# Patient Record
Sex: Female | Born: 1960 | Hispanic: Yes | Marital: Married | Smoking: Never smoker
Health system: Southern US, Community
[De-identification: ages and names within clinical notes are randomized; demographics above are authoritative.]

## PROBLEM LIST (undated history)

## (undated) DIAGNOSIS — I10 Essential (primary) hypertension: Secondary | ICD-10-CM

## (undated) DIAGNOSIS — M1990 Unspecified osteoarthritis, unspecified site: Secondary | ICD-10-CM

## (undated) DIAGNOSIS — K2970 Gastritis, unspecified, without bleeding: Secondary | ICD-10-CM

## (undated) DIAGNOSIS — Z8719 Personal history of other diseases of the digestive system: Secondary | ICD-10-CM

## (undated) DIAGNOSIS — F419 Anxiety disorder, unspecified: Secondary | ICD-10-CM

## (undated) DIAGNOSIS — C8590 Non-Hodgkin lymphoma, unspecified, unspecified site: Secondary | ICD-10-CM

## (undated) DIAGNOSIS — K5790 Diverticulosis of intestine, part unspecified, without perforation or abscess without bleeding: Secondary | ICD-10-CM

## (undated) DIAGNOSIS — B9681 Helicobacter pylori [H. pylori] as the cause of diseases classified elsewhere: Secondary | ICD-10-CM

## (undated) HISTORY — DX: Non-Hodgkin lymphoma, unspecified, unspecified site: C85.90

## (undated) HISTORY — PX: APPENDECTOMY: SHX54

## (undated) HISTORY — PX: TUBAL LIGATION: SHX77

## (undated) HISTORY — PX: ESOPHAGOGASTRODUODENOSCOPY: SHX1529

## (undated) HISTORY — PX: NASAL SINUS SURGERY: SHX719

---

## 2004-03-31 DIAGNOSIS — D649 Anemia, unspecified: Secondary | ICD-10-CM

## 2004-03-31 HISTORY — DX: Anemia, unspecified: D64.9

## 2004-08-06 ENCOUNTER — Ambulatory Visit: Payer: Self-pay

## 2006-05-26 ENCOUNTER — Ambulatory Visit: Payer: Self-pay

## 2007-12-09 ENCOUNTER — Ambulatory Visit: Payer: Self-pay | Admitting: Certified Nurse Midwife

## 2009-01-19 ENCOUNTER — Ambulatory Visit: Payer: Self-pay | Admitting: Unknown Physician Specialty

## 2010-07-17 ENCOUNTER — Ambulatory Visit: Payer: Self-pay | Admitting: Family Medicine

## 2010-10-22 ENCOUNTER — Ambulatory Visit: Payer: Self-pay | Admitting: Gastroenterology

## 2010-10-24 LAB — PATHOLOGY REPORT

## 2011-12-10 ENCOUNTER — Ambulatory Visit: Payer: Self-pay | Admitting: Family Medicine

## 2013-04-28 ENCOUNTER — Ambulatory Visit: Payer: Self-pay | Admitting: Family Medicine

## 2013-11-03 ENCOUNTER — Ambulatory Visit: Payer: Self-pay | Admitting: Primary Care

## 2014-03-22 ENCOUNTER — Ambulatory Visit: Payer: Self-pay | Admitting: Primary Care

## 2014-05-08 ENCOUNTER — Ambulatory Visit: Payer: Self-pay | Admitting: Gastroenterology

## 2014-05-18 ENCOUNTER — Observation Stay: Payer: Self-pay | Admitting: Surgery

## 2014-07-24 LAB — SURGICAL PATHOLOGY

## 2014-07-30 NOTE — Op Note (Signed)
PATIENT NAME:  Kim Contreras, Kim Contreras MR#:  [id number] DATE OF BIRTH:  1960-04-23  DATE OF PROCEDURE:  05/18/2014  PREOPERATIVE DIAGNOSIS:  Acute appendicitis.   POSTOPERATIVE DIAGNOSIS: Acute appendicitis.   PROCEDURE PERFORMED: Laparoscopic appendectomy.  ANESTHESIA:  General.  SURGEON: Rodena Goldmann III, MD  PROCEDURE:  The patient was placed in the supine position after appropriate induction of general anesthesia.  The abdomen was prepped and sterilely draped in sterile towels.  The patient was placed in the head down, feet up position. A small infraumbilical incision was made in the standard fashion, carried down bluntly through the subcutaneous tissue. Veress needle was used to cannulate the peritoneal cavity. CO2 was insufflated to appropriate pressure measurements. When approximately 2.5 liters of CO2 were instilled, the Veress needle was withdrawn 11 mm [employer] port was inserted into the peritoneal cavity. Intraperitoneal position was confirmed. CO2 was re-insufflated. A midepigastric transverse incision was made and an 11 mm port was inserted under direct vision. The camera was moved to the upper port right lower quadrant investigated.  The appendix did appear to be covered in fibrinous exudate but not ruptured.  The appendix was dilated, injected and thickened. A left lower quadrant transverse incision was made and a 12 mm port was inserted under direct vision. A window was created behind the base of the appendix at the joint cecum, and appendix was divided with a single application of the Endo GIA stapler carrying a blue load. The mesoappendix was divided with several applications of the GIA stapler carrying a white load. The appendix captured in the Endo Catch apparatus and removed to the left lower quadrant incision. The area was copiously suctioned and irrigated. All ports were withdrawn after closing the left lower quadrant site with figure-of-8 sutures of 0 Vicryl using the suture passer.  The abdomen was then desufflated. Skin incision was closed with 5-0 nylon. The area was infiltrated with 0.25% Marcaine for postoperative pain control. Sterile dressings were applied. The patient was returned to the recovery room after tolerating the procedure well. Sponge, instrument, and needle counts were correct in the Operating Room.   ____________________________ Micheline Maze, MD rle:mc D: 05/18/2014 15:25:13 ET T: 05/18/2014 15:50:07 ET JOB#: [id number]  cc: Rodena Goldmann III, MD, <Dictator> Rodena Goldmann MD ELECTRONICALLY SIGNED 05/19/2014 18:10

## 2014-07-30 NOTE — H&P (Signed)
   Subjective/Chief Complaint RLQ pain x 8 hours, nausea   History of Present Illness 54 yo F who is currently being worked up for epigastric and RUQ pain who presents with acute onset RUQ pain x 8 hours.  Began diffusely, thought she felt "bloated" now has progressed to RLQ pain.  Different that pain she is being worked up for.  + nausea, some chills.  No sick contacts, no unusual ingestions.  No diarrhea/constipation. WBC 11, CT shows thickened fluid filled appendix with periappendiceal inflammation and fecaliths.  Last PO was 8 pm.  Spanish speaking.   Past History H/o tubal ligation H/o Nasal surgery   Code Status Full Code   ALLERGIES:  No Known Allergies:   Family and Social History:  Family History Hypertension   Social History negative tobacco, negative ETOH, negative Illicit drugs   Place of Living Home   Review of Systems:  Subjective/Chief Complaint RLQ pain, nausea   Fever/Chills Yes   Cough No   Sputum No   Abdominal Pain Yes   Diarrhea No   Constipation No   Nausea/Vomiting Yes   SOB/DOE No   Chest Pain No   Dysuria No   Tolerating Diet Yes  Nauseated   Medications/Allergies Reviewed Medications/Allergies reviewed   Physical Exam:  GEN well developed, well nourished, no acute distress   HEENT pink conjunctivae, PERRL, hearing intact to voice, good dentition   RESP normal resp effort  clear BS  no use of accessory muscles   CARD regular rate  no murmur  No LE edema   ABD positive tenderness  no hernia  soft  normal BS  RLQ pain, + rovsig's sign   EXTR negative cyanosis/clubbing, negative edema   SKIN normal to palpation, No rashes, No ulcers, skin turgor good   NEURO cranial nerves intact, follows commands, strength:, motor/sensory function intact   PSYCH A+O to time, place, person, good insight    Assessment/Admission Diagnosis 54 yo with RLQ pain, mild leukocytosis, nausea.  Imaging and history suggestive of acute appendicitis.    Plan Admit, NPO, IVF, IV abx.  Plan on laparoscopic appendectomy.   Electronic Signatures: Floyde Parkins (MD)  (Signed 18-Feb-16 05:56)  Authored: CHIEF COMPLAINT and HISTORY, ALLERGIES, FAMILY AND SOCIAL HISTORY, REVIEW OF SYSTEMS, PHYSICAL EXAM, ASSESSMENT AND PLAN   Last Updated: 18-Feb-16 05:56 by Floyde Parkins (MD)

## 2014-07-30 NOTE — Discharge Summary (Signed)
PATIENT NAME:  Kim Contreras, Kim Contreras MR#:  [id number] DATE OF BIRTH:  Aug 09, 1960  DATE OF ADMISSION:  05/18/2014 DATE OF DISCHARGE: 05/21/2014   BRIEF HISTORY: Martindelcampo is a 54 year old woman admitted with abdominal pain. Clinical presentation and imaging studies consistent with possible acute appendicitis. She was taken to surgery on the afternoon of February 18 where she underwent a laparoscopic appendectomy. The procedure was uncomplicated. However, postoperatively she had significant problems with urinary retention. Foley catheter was placed. She was able to tolerate a liquid diet and advance to soft diet. The catheter was removed but again she was unable to void. The Foley catheter was reinserted on February 20. I spoke with the urologist on call who recommended discharging the patient with a leg bag in place with the plan of follow-up within 1 week in the office. This plan has been discussed with the patient and her daughter through the hospital interpreter and they are in agreement. We will discharge her home today to be followed in the office later this week.   DISCHARGE MEDICATIONS: Include acetaminophen hydrocodone 5/325 every 4-6 hours p.r.n., omeprazole 40 once a day.   FINAL DISCHARGE DIAGNOSES:  1.  Acute appendicitis.  2.  Urinary retention.   SURGERY: Laparoscopic appendectomy.   ____________________________ Micheline Maze, MD rle:mc D: 05/21/2014 10:37:20 ET T: 05/21/2014 11:55:16 ET JOB#: [id number]  cc: Micheline Maze, MD, <Dictator> Rodena Goldmann MD ELECTRONICALLY SIGNED 05/24/2014 8:17

## 2014-10-27 ENCOUNTER — Ambulatory Visit
Admission: RE | Admit: 2014-10-27 | Discharge: 2014-10-27 | Disposition: A | Discharge status: Home / Self Care | Payer: Commercial Managed Care - PPO | Source: Ambulatory Visit | Attending: Gastroenterology | Admitting: Gastroenterology

## 2014-10-27 ENCOUNTER — Encounter: Payer: Self-pay | Admitting: *Deleted

## 2014-10-27 ENCOUNTER — Ambulatory Visit: Payer: Commercial Managed Care - PPO | Admitting: Anesthesiology

## 2014-10-27 ENCOUNTER — Encounter
Admission: RE | Disposition: A | Discharge status: Home / Self Care | Payer: Self-pay | Source: Ambulatory Visit | Attending: Gastroenterology

## 2014-10-27 DIAGNOSIS — R1031 Right lower quadrant pain: Secondary | ICD-10-CM | POA: Diagnosis present

## 2014-10-27 DIAGNOSIS — K648 Other hemorrhoids: Secondary | ICD-10-CM | POA: Insufficient documentation

## 2014-10-27 DIAGNOSIS — G709 Myoneural disorder, unspecified: Secondary | ICD-10-CM | POA: Diagnosis not present

## 2014-10-27 DIAGNOSIS — K5730 Diverticulosis of large intestine without perforation or abscess without bleeding: Secondary | ICD-10-CM | POA: Diagnosis not present

## 2014-10-27 DIAGNOSIS — D123 Benign neoplasm of transverse colon: Secondary | ICD-10-CM | POA: Diagnosis not present

## 2014-10-27 DIAGNOSIS — K449 Diaphragmatic hernia without obstruction or gangrene: Secondary | ICD-10-CM | POA: Insufficient documentation

## 2014-10-27 DIAGNOSIS — K625 Hemorrhage of anus and rectum: Secondary | ICD-10-CM | POA: Insufficient documentation

## 2014-10-27 DIAGNOSIS — Z79899 Other long term (current) drug therapy: Secondary | ICD-10-CM | POA: Diagnosis not present

## 2014-10-27 DIAGNOSIS — I10 Essential (primary) hypertension: Secondary | ICD-10-CM | POA: Insufficient documentation

## 2014-10-27 DIAGNOSIS — K219 Gastro-esophageal reflux disease without esophagitis: Secondary | ICD-10-CM | POA: Insufficient documentation

## 2014-10-27 DIAGNOSIS — R252 Cramp and spasm: Secondary | ICD-10-CM | POA: Diagnosis not present

## 2014-10-27 DIAGNOSIS — R1011 Right upper quadrant pain: Secondary | ICD-10-CM | POA: Insufficient documentation

## 2014-10-27 DIAGNOSIS — M1990 Unspecified osteoarthritis, unspecified site: Secondary | ICD-10-CM | POA: Insufficient documentation

## 2014-10-27 HISTORY — PX: COLONOSCOPY WITH PROPOFOL: SHX5780

## 2014-10-27 HISTORY — DX: Helicobacter pylori (H. pylori) as the cause of diseases classified elsewhere: B96.81

## 2014-10-27 HISTORY — DX: Gastritis, unspecified, without bleeding: K29.70

## 2014-10-27 HISTORY — DX: Essential (primary) hypertension: I10

## 2014-10-27 HISTORY — DX: Unspecified osteoarthritis, unspecified site: M19.90

## 2014-10-27 SURGERY — COLONOSCOPY WITH PROPOFOL
Anesthesia: General

## 2014-10-27 MED ORDER — METOPROLOL TARTRATE 1 MG/ML IV SOLN
INTRAVENOUS | Status: DC | PRN
  Administered 2014-10-27: 2 mg via INTRAVENOUS

## 2014-10-27 MED ORDER — PHENYLEPHRINE HCL 10 MG/ML IJ SOLN
INTRAMUSCULAR | Status: DC | PRN
Start: 2014-10-27 — End: 2014-10-27
  Administered 2014-10-27: 100 ug via INTRAVENOUS

## 2014-10-27 MED ORDER — GLYCOPYRROLATE 0.2 MG/ML IJ SOLN
INTRAMUSCULAR | Status: DC | PRN
Start: 2014-10-27 — End: 2014-10-27
  Administered 2014-10-27: 0.2 mg via INTRAVENOUS

## 2014-10-27 MED ORDER — SODIUM CHLORIDE 0.9 % IV SOLN
INTRAVENOUS | Status: DC
  Administered 2014-10-27: 1000 mL via INTRAVENOUS

## 2014-10-27 MED ORDER — LIDOCAINE HCL (CARDIAC) 20 MG/ML IV SOLN
INTRAVENOUS | Status: DC | PRN
  Administered 2014-10-27: 60 mg via INTRAVENOUS

## 2014-10-27 MED ORDER — PROPOFOL 10 MG/ML IV BOLUS
INTRAVENOUS | Status: DC | PRN
  Administered 2014-10-27: 40 mg via INTRAVENOUS

## 2014-10-27 MED ORDER — PROPOFOL INFUSION 10 MG/ML OPTIME
INTRAVENOUS | Status: DC | PRN
  Administered 2014-10-27: 120 ug/kg/min via INTRAVENOUS

## 2014-10-27 NOTE — Transfer of Care (Signed)
Immediate Anesthesia Transfer of Care Note  Patient: Kim Contreras  Procedure(s) Performed: Procedure(s): COLONOSCOPY WITH PROPOFOL (N/A)  Patient Location: Short Stay  Anesthesia Type:General  Level of Consciousness: awake, alert , oriented and patient cooperative  Airway & Oxygen Therapy: Patient Spontanous Breathing and Patient connected to nasal cannula oxygen  Post-op Assessment: Report given to RN, Post -op Vital signs reviewed and stable and Patient moving all extremities X 4  Post vital signs: Reviewed and stable  Last Vitals:  Filed Vitals:   10/27/14 1229  BP: 131/91  Pulse: 73  Temp: 36.6 C  Resp: 16    Complications: No apparent anesthesia complications

## 2014-10-27 NOTE — H&P (Signed)
Outpatient short stay form Pre-procedure 10/27/2014 11:37 AM Kim Sails MD  Primary Physician: Dr. Frazier Richards  Reason for visit:  Colonoscopy  History of present illness:  Patient is a D89-year-old female presenting with continued issues of right upper quadrant pain and occasional bright red rectal bleeding. Consent intermittent in nature sometimes associated with constipation sometimes not. It is not been daily. He states that her right upper quadrant pain may be a little bit better but and 10 use. She has not yet seen a surgeon for further discussion in regards to possible old cystectomy.  She takes no aspirin's or blood tenderness.    Current facility-administered medications:  .  0.9 %  sodium chloride infusion, , Intravenous, Continuous, Kim Sails, MD, Last Rate: 20 mL/hr at 10/27/14 1054, 1,000 mL at 10/27/14 1054  Prescriptions prior to admission  Medication Sig Dispense Refill Last Dose  . omeprazole (PRILOSEC) 20 MG capsule Take 20 mg by mouth daily.     . cyclobenzaprine (FLEXERIL) 10 MG tablet Take 10 mg by mouth 2 (two) times daily as needed for muscle spasms.   Not Taking at Unknown time  . levocetirizine (XYZAL) 5 MG tablet Take 5 mg by mouth every evening.   Not Taking at Unknown time     No Known Allergies   Past Medical History  Diagnosis Date  . Hypertension   . Arthritis   . Hiatal hernia   . Helicobacter pylori gastritis     Review of systems:      Physical Exam    Heart and lungs: Regular rate and rhythm without rub or gallop, lungs are bilaterally clear.    HEENT: Normocephalic atraumatic eyes are anicteric    Other:     Pertinant exam for procedure: Soft nontender nondistended bowel sounds positive normoactive    Planned proceedures: Colonoscopy and indicated procedures I have discussed the risks benefits and complications of procedures to include not limited to bleeding, infection, perforation and the risk of sedation and  the patient wishes to proceed.    Kim Sails, MD Gastroenterology 10/27/2014  11:37 AM

## 2014-10-27 NOTE — Op Note (Signed)
[facility] Gastroenterology Patient Name: Kim Contreras Procedure Date: 10/27/2014 11:19 AM MRN: [id number] Account #: [id number] Date of Birth: [birth year] Admit Type: Outpatient Age: 54 Room: [facility] ENDO ROOM 3 Gender: Female Note Status: Finalized Procedure:         Colonoscopy Indications:       Abdominal pain in the right lower quadrant, Abdominal pain                     in the right upper quadrant, Rectal bleeding Providers:         Lollie Sails, MD Referring MD:      No Local Md, MD (Referring MD) Medicines:         Monitored Anesthesia Care Complications:     No immediate complications. Procedure:         Pre-Anesthesia Assessment:                    - ASA Grade Assessment: II - A patient with mild systemic                     disease.                    After obtaining informed consent, the colonoscope was                     passed under direct vision. Throughout the procedure, the                     patient's blood pressure, pulse, and oxygen saturations                     were monitored continuously. The Colonoscope was                     introduced through the anus and advanced to the the cecum,                     identified by appendiceal orifice and ileocecal valve. The                     quality of the bowel preparation was good. Findings:      A 5 mm polyp was found at the splenic flexure. The polyp was flat. The       polyp was removed with a cold biopsy forceps. Resection and retrieval       were complete.      Multiple small-mouthed diverticula were found in the sigmoid colon, in       the descending colon, in the transverse colon and in the ascending colon.      Non-bleeding internal hemorrhoids were found during anoscopy. The       hemorrhoids were medium-sized.      there is an area of what appeared to be some tethering, in the sigmoid       that makes passage more proximally difficult. Repositioning and       abdominal support was  effective.      The exam was otherwise normal throughout the examined colon.      The digital rectal exam was normal. Impression:        - One 5 mm polyp at the splenic flexure. Resected and  retrieved.                    - Diverticulosis in the sigmoid colon, in the descending                     colon, in the transverse colon and in the ascending colon.                    - Non-bleeding internal hemorrhoids. Recommendation:    - Await pathology results.                    - Telephone GI clinic for pathology results. Procedure Code(s): --- Professional ---                    [phone], Colonoscopy, flexible; with biopsy, single or                     multiple Diagnosis Code(s): --- Professional ---                    211.3, Benign neoplasm of colon                    455.0, Internal hemorrhoids without mention of complication                    789.03, Abdominal pain, right lower quadrant                    789.01, Abdominal pain, right upper quadrant                    569.3, Hemorrhage of rectum and anus                    562.10, Diverticulosis of colon (without mention of                     hemorrhage) CPT copyright 2014 American Medical Association. All rights reserved. The codes documented in this report are preliminary and upon coder review may  be revised to meet current compliance requirements. Lollie Sails, MD 10/27/2014 12:31:46 PM This report has been signed electronically. Number of Addenda: 0 Note Initiated On: 10/27/2014 11:19 AM Scope Withdrawal Time: 0 hours 6 minutes 38 seconds  Total Procedure Duration: 0 hours 31 minutes 34 seconds       [facility]

## 2014-10-27 NOTE — Anesthesia Postprocedure Evaluation (Signed)
  Anesthesia Post-op Note  Patient: Kim Contreras  Procedure(s) Performed: Procedure(s): COLONOSCOPY WITH PROPOFOL (N/A)  Anesthesia type:General  Patient location: PACU  Post pain: Pain level controlled  Post assessment: Post-op Vital signs reviewed, Patient's Cardiovascular Status Stable, Respiratory Function Stable, Patent Airway and No signs of Nausea or vomiting  Post vital signs: Reviewed and stable  Last Vitals:  Filed Vitals:   10/27/14 1300  BP: 126/80  Pulse: 74  Temp:   Resp: 14    Level of consciousness: awake, alert  and patient cooperative  Complications: No apparent anesthesia complications

## 2014-10-27 NOTE — Anesthesia Preprocedure Evaluation (Addendum)
Anesthesia Evaluation  Patient identified by MRN, date of birth, ID band Patient awake    Reviewed: Allergy & Precautions, H&P , NPO status , Patient's Chart, lab work & pertinent test results, reviewed documented beta blocker date and time   Airway Mallampati: II  TM Distance: >3 FB Neck ROM: full    Dental no notable dental hx. (+) Poor Dentition   Pulmonary neg pulmonary ROS,  breath sounds clear to auscultation  Pulmonary exam normal       Cardiovascular Exercise Tolerance: Good hypertension, Normal cardiovascular examRhythm:regular Rate:Normal     Neuro/Psych  Neuromuscular disease negative psych ROS   GI/Hepatic Neg liver ROS, hiatal hernia, GERD-  Controlled,  Endo/Other  negative endocrine ROS  Renal/GU negative Renal ROS  negative genitourinary   Musculoskeletal  (+) Arthritis -,   Abdominal   Peds  Hematology negative hematology ROS (+)   Anesthesia Other Findings Past Medical History:   Hypertension                                                 Arthritis                                                    Hiatal hernia                                                Helicobacter pylori gastritis                                Reproductive/Obstetrics negative OB ROS                            Anesthesia Physical Anesthesia Plan  ASA: III  Anesthesia Plan: General   Post-op Pain Management:    Induction:   Airway Management Planned:   Additional Equipment:   Intra-op Plan:   Post-operative Plan:   Informed Consent: I have reviewed the patients History and Physical, chart, labs and discussed the procedure including the risks, benefits and alternatives for the proposed anesthesia with the patient or authorized representative who has indicated his/her understanding and acceptance.   Dental Advisory Given  Plan Discussed with: Anesthesiologist, CRNA and  Surgeon  Anesthesia Plan Comments: (Consent via interpreter )       Anesthesia Quick Evaluation

## 2014-10-30 ENCOUNTER — Encounter: Payer: Self-pay | Admitting: Gastroenterology

## 2014-10-30 LAB — SURGICAL PATHOLOGY

## 2014-12-11 ENCOUNTER — Other Ambulatory Visit: Payer: Self-pay | Admitting: Surgery

## 2014-12-11 DIAGNOSIS — R1011 Right upper quadrant pain: Secondary | ICD-10-CM

## 2014-12-14 ENCOUNTER — Ambulatory Visit
Admission: RE | Admit: 2014-12-14 | Discharge: 2014-12-14 | Disposition: A | Discharge status: Home / Self Care | Payer: Commercial Managed Care - PPO | Source: Ambulatory Visit | Attending: Surgery | Admitting: Surgery

## 2014-12-14 DIAGNOSIS — R1011 Right upper quadrant pain: Secondary | ICD-10-CM | POA: Diagnosis present

## 2014-12-14 DIAGNOSIS — K824 Cholesterolosis of gallbladder: Secondary | ICD-10-CM | POA: Diagnosis not present

## 2015-01-09 ENCOUNTER — Encounter
Admission: RE | Admit: 2015-01-09 | Discharge: 2015-01-09 | Disposition: A | Discharge status: Home / Self Care | Payer: Commercial Managed Care - PPO | Source: Ambulatory Visit | Attending: Surgery | Admitting: Surgery

## 2015-01-09 DIAGNOSIS — K824 Cholesterolosis of gallbladder: Secondary | ICD-10-CM | POA: Insufficient documentation

## 2015-01-09 DIAGNOSIS — Z01818 Encounter for other preprocedural examination: Secondary | ICD-10-CM | POA: Insufficient documentation

## 2015-01-09 HISTORY — DX: Personal history of other diseases of the digestive system: Z87.19

## 2015-01-09 NOTE — OR Nursing (Signed)
Interpreter for day of surgery requested

## 2015-01-19 ENCOUNTER — Encounter
Admission: RE | Disposition: A | Discharge status: Home / Self Care | Payer: Self-pay | Source: Ambulatory Visit | Attending: Surgery

## 2015-01-19 ENCOUNTER — Ambulatory Visit: Payer: Commercial Managed Care - PPO | Admitting: Anesthesiology

## 2015-01-19 ENCOUNTER — Ambulatory Visit
Admission: RE | Admit: 2015-01-19 | Discharge: 2015-01-19 | Disposition: A | Discharge status: Home / Self Care | Payer: Commercial Managed Care - PPO | Source: Ambulatory Visit | Attending: Surgery | Admitting: Surgery

## 2015-01-19 ENCOUNTER — Encounter: Payer: Self-pay | Admitting: *Deleted

## 2015-01-19 ENCOUNTER — Ambulatory Visit: Payer: Commercial Managed Care - PPO

## 2015-01-19 DIAGNOSIS — Z86010 Personal history of colonic polyps: Secondary | ICD-10-CM | POA: Diagnosis not present

## 2015-01-19 DIAGNOSIS — M1990 Unspecified osteoarthritis, unspecified site: Secondary | ICD-10-CM | POA: Diagnosis not present

## 2015-01-19 DIAGNOSIS — Z808 Family history of malignant neoplasm of other organs or systems: Secondary | ICD-10-CM | POA: Insufficient documentation

## 2015-01-19 DIAGNOSIS — Z79899 Other long term (current) drug therapy: Secondary | ICD-10-CM | POA: Insufficient documentation

## 2015-01-19 DIAGNOSIS — I10 Essential (primary) hypertension: Secondary | ICD-10-CM | POA: Diagnosis not present

## 2015-01-19 DIAGNOSIS — K5730 Diverticulosis of large intestine without perforation or abscess without bleeding: Secondary | ICD-10-CM | POA: Diagnosis not present

## 2015-01-19 DIAGNOSIS — K219 Gastro-esophageal reflux disease without esophagitis: Secondary | ICD-10-CM | POA: Insufficient documentation

## 2015-01-19 DIAGNOSIS — Z8261 Family history of arthritis: Secondary | ICD-10-CM | POA: Diagnosis not present

## 2015-01-19 DIAGNOSIS — K811 Chronic cholecystitis: Secondary | ICD-10-CM | POA: Diagnosis present

## 2015-01-19 DIAGNOSIS — K821 Hydrops of gallbladder: Secondary | ICD-10-CM | POA: Diagnosis not present

## 2015-01-19 DIAGNOSIS — Z800 Family history of malignant neoplasm of digestive organs: Secondary | ICD-10-CM | POA: Insufficient documentation

## 2015-01-19 DIAGNOSIS — K449 Diaphragmatic hernia without obstruction or gangrene: Secondary | ICD-10-CM | POA: Insufficient documentation

## 2015-01-19 DIAGNOSIS — Z419 Encounter for procedure for purposes other than remedying health state, unspecified: Secondary | ICD-10-CM

## 2015-01-19 HISTORY — PX: CHOLECYSTECTOMY: SHX55

## 2015-01-19 SURGERY — LAPAROSCOPIC CHOLECYSTECTOMY
Anesthesia: General | Wound class: Clean Contaminated

## 2015-01-19 MED ORDER — FENTANYL CITRATE (PF) 100 MCG/2ML IJ SOLN
INTRAMUSCULAR | Status: DC | PRN
Start: 2015-01-19 — End: 2015-01-19
  Administered 2015-01-19: 50 ug via INTRAVENOUS

## 2015-01-19 MED ORDER — ROCURONIUM BROMIDE 100 MG/10ML IV SOLN
INTRAVENOUS | Status: DC | PRN
  Administered 2015-01-19: 30 mg via INTRAVENOUS

## 2015-01-19 MED ORDER — GLYCOPYRROLATE 0.2 MG/ML IJ SOLN
INTRAMUSCULAR | Status: DC | PRN
  Administered 2015-01-19: 0.6 mg via INTRAVENOUS

## 2015-01-19 MED ORDER — ONDANSETRON HCL 4 MG/2ML IJ SOLN: 4.0000 mg | Freq: Once | INTRAMUSCULAR | Status: DC | PRN

## 2015-01-19 MED ORDER — PROMETHAZINE HCL 25 MG/ML IJ SOLN
12.5000 mg | Freq: Once | INTRAMUSCULAR | Status: AC
  Administered 2015-01-19: 12.5 mg via INTRAVENOUS

## 2015-01-19 MED ORDER — MIDAZOLAM HCL 2 MG/2ML IJ SOLN
INTRAMUSCULAR | Status: DC | PRN
  Administered 2015-01-19: 2 mg via INTRAVENOUS

## 2015-01-19 MED ORDER — FENTANYL CITRATE (PF) 100 MCG/2ML IJ SOLN
INTRAMUSCULAR | Status: AC
  Filled 2015-01-19: qty 2

## 2015-01-19 MED ORDER — PROMETHAZINE HCL 25 MG/ML IJ SOLN
INTRAMUSCULAR | Status: AC
  Filled 2015-01-19: qty 1

## 2015-01-19 MED ORDER — HEPARIN SODIUM (PORCINE) 1000 UNIT/ML IJ SOLN: INTRAMUSCULAR | Status: DC | PRN

## 2015-01-19 MED ORDER — HYDROCODONE-ACETAMINOPHEN 5-325 MG PO TABS: 1.0000 | ORAL_TABLET | ORAL | Status: DC | PRN

## 2015-01-19 MED ORDER — HEPARIN SODIUM (PORCINE) 5000 UNIT/ML IJ SOLN
INTRAMUSCULAR | Status: AC
  Filled 2015-01-19: qty 1

## 2015-01-19 MED ORDER — NEOSTIGMINE METHYLSULFATE 10 MG/10ML IV SOLN
INTRAVENOUS | Status: DC | PRN
  Administered 2015-01-19: 3 mg via INTRAVENOUS

## 2015-01-19 MED ORDER — ONDANSETRON HCL 4 MG/2ML IJ SOLN
INTRAMUSCULAR | Status: AC
  Filled 2015-01-19: qty 2

## 2015-01-19 MED ORDER — ONDANSETRON HCL 4 MG/2ML IJ SOLN
INTRAMUSCULAR | Status: DC | PRN
  Administered 2015-01-19: 4 mg via INTRAVENOUS

## 2015-01-19 MED ORDER — PROPOFOL 10 MG/ML IV BOLUS
INTRAVENOUS | Status: DC | PRN
  Administered 2015-01-19: 110 mg via INTRAVENOUS

## 2015-01-19 MED ORDER — HYDROMORPHONE HCL 1 MG/ML IJ SOLN
INTRAMUSCULAR | Status: DC | PRN
  Administered 2015-01-19: 0.5 mg via INTRAVENOUS
  Administered 2015-01-19: .6 mg via INTRAVENOUS

## 2015-01-19 MED ORDER — LIDOCAINE HCL (CARDIAC) 20 MG/ML IV SOLN
INTRAVENOUS | Status: DC | PRN
  Administered 2015-01-19: 100 mg via INTRAVENOUS

## 2015-01-19 MED ORDER — LACTATED RINGERS IV SOLN
INTRAVENOUS | Status: DC
  Administered 2015-01-19 (×2): via INTRAVENOUS

## 2015-01-19 MED ORDER — FENTANYL CITRATE (PF) 100 MCG/2ML IJ SOLN
25.0000 ug | INTRAMUSCULAR | Status: DC | PRN
  Administered 2015-01-19 (×4): 25 ug via INTRAVENOUS

## 2015-01-19 SURGICAL SUPPLY — 35 items
APPLIER CLIP ROT 10 11.4 M/L (STAPLE) ×3
CANISTER SUCT 1200ML W/VALVE (MISCELLANEOUS) ×3 IMPLANT
CANNULA DILATOR 10 W/SLV (CANNULA) ×2 IMPLANT
CANNULA DILATOR 10MM W/SLV (CANNULA) ×1
CATH REDDICK CHOLANGI 4FR 50CM (CATHETERS) ×3 IMPLANT
CHLORAPREP W/TINT 26ML (MISCELLANEOUS) ×3 IMPLANT
CLIP APPLIE ROT 10 11.4 M/L (STAPLE) ×1 IMPLANT
CLOSURE WOUND 1/2 X4 (GAUZE/BANDAGES/DRESSINGS) ×1
DRAPE SHEET LG 3/4 BI-LAMINATE (DRAPES) ×3 IMPLANT
GAUZE SPONGE 4X4 12PLY STRL (GAUZE/BANDAGES/DRESSINGS) ×3 IMPLANT
GLOVE BIO SURGEON STRL SZ7.5 (GLOVE) ×3 IMPLANT
GOWN STRL REUS W/ TWL LRG LVL3 (GOWN DISPOSABLE) ×4 IMPLANT
GOWN STRL REUS W/TWL LRG LVL3 (GOWN DISPOSABLE) ×8
IRRIGATION STRYKERFLOW (MISCELLANEOUS) ×1 IMPLANT
IRRIGATOR STRYKERFLOW (MISCELLANEOUS) ×3
IV NS 1000ML (IV SOLUTION) ×2
IV NS 1000ML BAXH (IV SOLUTION) ×1 IMPLANT
KIT RM TURNOVER STRD PROC AR (KITS) ×3 IMPLANT
LABEL OR SOLS (LABEL) ×3 IMPLANT
NEEDLE FILTER BLUNT 18X 1/2SAF (NEEDLE) ×2
NEEDLE FILTER BLUNT 18X1 1/2 (NEEDLE) ×1 IMPLANT
NS IRRIG 500ML POUR BTL (IV SOLUTION) ×3 IMPLANT
PACK LAP CHOLECYSTECTOMY (MISCELLANEOUS) ×3 IMPLANT
PAD GROUND ADULT SPLIT (MISCELLANEOUS) ×3 IMPLANT
SCISSORS METZENBAUM CVD 33 (INSTRUMENTS) ×3 IMPLANT
SEAL FOR SCOPE WARMER C3101 (MISCELLANEOUS) ×3 IMPLANT
SLEEVE ENDOPATH XCEL 5M (ENDOMECHANICALS) ×3 IMPLANT
STRIP CLOSURE SKIN 1/2X4 (GAUZE/BANDAGES/DRESSINGS) ×2 IMPLANT
SUT CHROMIC 5 0 RB 1 27 (SUTURE) ×6 IMPLANT
SUT VIC AB 0 CT2 27 (SUTURE) IMPLANT
SYR 3ML LL SCALE MARK (SYRINGE) ×3 IMPLANT
TROCAR XCEL NON-BLD 11X100MML (ENDOMECHANICALS) ×3 IMPLANT
TROCAR XCEL NON-BLD 5MMX100MML (ENDOMECHANICALS) ×3 IMPLANT
TUBING INSUFFLATOR HI FLOW (MISCELLANEOUS) ×3 IMPLANT
WATER STERILE IRR 1000ML POUR (IV SOLUTION) ×3 IMPLANT

## 2015-01-19 NOTE — OR Nursing (Signed)
12.5 mg phenergan given via IV diluted in 20 ml NS and inserted into infusing IV line.

## 2015-01-19 NOTE — Anesthesia Procedure Notes (Signed)
Procedure Name: Intubation Date/Time: 01/19/2015 7:42 AM Performed by: [employer] Pre-anesthesia Checklist: Patient identified, Emergency Drugs available, Suction available, Patient being monitored and Timeout performed Patient Re-evaluated:Patient Re-evaluated prior to inductionOxygen Delivery Method: Circle system utilized Preoxygenation: Pre-oxygenation with 100% oxygen Intubation Type: IV induction Ventilation: Mask ventilation without difficulty Laryngoscope Size: Mac and 3 Grade View: Grade I Tube type: Oral Tube size: 7.0 mm Number of attempts: 1 Airway Equipment and Method: Stylet Secured at: 21 cm Tube secured with: Tape Dental Injury: Teeth and Oropharynx as per pre-operative assessment

## 2015-01-19 NOTE — Op Note (Signed)
OPERATIVE REPORT  PREOPERATIVE DIAGNOSIS:  Chronic cholecystitis   POSTOPERATIVE DIAGNOSIS: Chronic cholecystitis cholelithiasis  PROCEDURE: Laparoscopic cholecystectomy   ANESTHESIA: General  SURGEON: [name] M.D.  INDICATIONS: She has a history of right upper quadrant pains and ultrasound findings of gallbladder polyps. It appeared that cholecystitis was the likely cause for pain. Surgery was recommended for definitive treatment.  With the patient on the operating table in the supine position under general endotracheal anesthesia the abdomen was prepared with ChloraPrep solution and draped in a sterile manner. A short incision was made in the inferior aspect of the umbilicus and carried down to the deep fascia which was grasped with a laryngeal hook. The Veress needle was inserted aspirated and irrigated with a saline solution. The abdominal cavity was insufflated with carbon dioxide. Veress needle was removed. The 10 mm cannula was inserted. The 10 mm 0 laparoscope was inserted to view the peritoneal cavity. Initial inspection revealed a smooth surface of the liver.  Another incision was made in the epigastrium slightly to the right of the midline to introduce an 11 mm cannula. 2 incisions were made in the lateral aspect of the right upper quadrant to introduce 2   5 mm cannulas.  The gallbladder was retracted towards the right shoulder.   The gallbladder neck was retracted inferiorly and laterally.  The porta hepatis was identified. The gallbladder was mobilized with incision of the visceral peritoneum. The cystic duct was dissected free from surrounding structures. The cystic artery was dissected free from surrounding structures. A critical view of safety was demonstrated  An Endo Clip was placed across the cystic duct adjacent to the gallbladder neck. An incision was made in the cystic duct to introduce a Reddick catheter. The Reddick catheter would not thread into the cystic duct. The  cystic duct appeared to be small in size. The Reddick catheter was removed. The cystic duct was doubly ligated with endoclips and divided. The cystic artery was controlled with double endoclips and divided. The gallbladder was dissected free from the liver with use of hook and cautery and blunt dissection. Bleeding was minimal and hemostasis was intact. The gallbladder was delivered up through the infraumbilical incision and passed off to a side table. The right upper quadrant was further inspected and hemostasis was intact. The cannulas were removed Sann minimal bleeding from the subcutaneous tissues. Several small bleeding points were cauterized. The skin incisions were closed with interrupted 5-0 chromic subcutaneous suture benzoin and Steri-Strips. Gauze dressings were applied with paper tape..  The gallbladder was opened on a side table and could identify small stones. The gallbladder with stones was sent in formalin for routine pathology  The patient appeared to be in satisfactory condition and was prepared for transfer to the recovery room  [facility].D.

## 2015-01-19 NOTE — Anesthesia Preprocedure Evaluation (Signed)
Anesthesia Evaluation  Patient identified by MRN, date of birth, ID band Patient awake    Reviewed: Allergy & Precautions, NPO status , Patient's Chart, lab work & pertinent test results  Airway Mallampati: II  TM Distance: >3 FB Neck ROM: Full    Dental no notable dental hx.    Pulmonary neg pulmonary ROS,    Pulmonary exam normal breath sounds clear to auscultation       Cardiovascular hypertension, Pt. on medications Normal cardiovascular exam     Neuro/Psych negative neurological ROS  negative psych ROS   GI/Hepatic Neg liver ROS, hiatal hernia, GERD  Medicated and Controlled,  Endo/Other  negative endocrine ROS  Renal/GU negative Renal ROS  negative genitourinary   Musculoskeletal  (+) Arthritis , Osteoarthritis,    Abdominal Normal abdominal exam  (+)   Peds negative pediatric ROS (+)  Hematology negative hematology ROS (+)   Anesthesia Other Findings   Reproductive/Obstetrics                             Anesthesia Physical Anesthesia Plan  ASA: II  Anesthesia Plan: General   Post-op Pain Management:    Induction: Intravenous  Airway Management Planned: Oral ETT  Additional Equipment:   Intra-op Plan:   Post-operative Plan: Extubation in OR  Informed Consent:   Dental advisory given  Plan Discussed with: CRNA and Surgeon  Anesthesia Plan Comments:         Anesthesia Quick Evaluation

## 2015-01-19 NOTE — H&P (Signed)
Kim Contreras is an 54 y.o. female.   Chief Complaint: Right upper quadrant pain HPI: She reports a history of right upper quadrant pain which dates back evening before December 2015. She reports the pain is often, on after a greasy meal she had no associated nausea or vomiting. Ultrasound demonstrated small gallbladder polyps.  Past Medical History  Diagnosis Date  . Hypertension   . Arthritis   . Helicobacter pylori gastritis   . History of hiatal hernia     Past Surgical History  Procedure Laterality Date  . Tubal ligation    . Esophagogastroduodenoscopy    . Appendectomy    . Colonoscopy with propofol N/A 10/27/2014    Procedure: COLONOSCOPY WITH PROPOFOL;  [name], MD;  Location: [facility] ENDOSCOPY;  Service: Endoscopy;  [name];  . Nasal sinus surgery      History reviewed. No pertinent family history. Social History:  reports that she has never smoked. She has never used smokeless tobacco. She reports that she does not drink alcohol or use illicit drugs.  Allergies: No Known Allergies  Medications Prior to Admission  Medication Sig Dispense Refill  . levocetirizine (XYZAL) 5 MG tablet Take 5 mg by mouth every evening.    Marland Kitchen lisinopril (PRINIVIL,ZESTRIL) 10 MG tablet Take 10 mg by mouth daily.    Marland Kitchen omeprazole (PRILOSEC) 20 MG capsule Take 20 mg by mouth daily.      No results found for this or any previous visit (from the past 48 hour(s)). No results found.  ROS she reports no recent acute illness such as cough cold or sore throat. She is having no difficulty breathing. She reports she has been eating satisfactorily and moving her bowels satisfactorily. Review of systems otherwise negative  Blood pressure 136/93, pulse 59, temperature 98.2 F (36.8 C), temperature source Oral, resp. rate 16, last menstrual period 01/18/2005, SpO2 100 %. Physical Exam  GENERAL:  Awake alert and oriented and in no acute distress.  HEENT:  Head is normocephalic.   Pupils are equal reactive to light.  Extraocular movements are intact. Sclera is clear.  Pharynx is clear.  LUNGS:  Clear without rales rhonchi or wheezes.  HEART:  Regular rhythm S1-S2, without murmur.  Abdomen: Soft flat and nontender with no palpable mass.   Extremities with no dependent edema  Neurologic awake alert and oriented  Clinical data: Hemoglobin 13.2, platelet count 240,000. Metabolic panel c is normal  Assessment/Plan Chronic cholecystitis. Plan is for laparoscopic cholecystectomy. This was discussed  Kim Contreras 01/19/2015, 7:22 AM

## 2015-01-19 NOTE — Discharge Instructions (Signed)
Take Tylenol or Norco if needed for pain.  Continue usual medicines.  Remove dressings on Saturday.  May shower Sunday.  Avoid straining and heavy lifting for 1 week.  AMBULATORY SURGERY  DISCHARGE INSTRUCTIONS   1) The drugs that you were given will stay in your system until tomorrow so for the next 24 hours you should not:  A) Drive an automobile B) Make any legal decisions C) Drink any alcoholic beverage   2) You may resume regular meals tomorrow.  Today it is better to start with liquids and gradually work up to solid foods.  You may eat anything you prefer, but it is better to start with liquids, then soup and crackers, and gradually work up to solid foods.   3) Please notify your doctor immediately if you have any unusual bleeding, trouble breathing, redness and pain at the surgery site, drainage, fever, or pain not relieved by medication.    4) Additional Instructions:        Please contact your physician with any problems or Same Day Surgery at [phone], Monday through Friday 6 am to 4 pm, or [place] at [facility] number at [phone].

## 2015-01-19 NOTE — Transfer of Care (Signed)
Immediate Anesthesia Transfer of Care Note  Patient: Kim Contreras  Procedure(s) Performed: Procedure(s): LAPAROSCOPIC CHOLECYSTECTOMY (N/A)  Patient Location: PACU  Anesthesia Type:General  Level of Consciousness: awake, alert  and oriented  Airway & Oxygen Therapy: Patient Spontanous Breathing and Patient connected to face mask oxygen  Post-op Assessment: Report given to RN and Post -op Vital signs reviewed and stable  Post vital signs: Reviewed and stable  Last Vitals:  Filed Vitals:   01/19/15 0617  BP: 136/93  Pulse: 59  Temp: 36.8 C  Resp: 16    Complications: No apparent anesthesia complications

## 2015-01-19 NOTE — OR Nursing (Signed)
Pt sleeping. When awakened states she is having pain and still has a little nausea. Discussed with family to send her home and give pain medication if she needs it after her prescription is filled. Family agreeable.

## 2015-01-19 NOTE — OR Nursing (Signed)
Dr Tamala Julian by to see pt. Pt sleeping at intervals.

## 2015-01-22 LAB — SURGICAL PATHOLOGY

## 2015-01-29 NOTE — Anesthesia Postprocedure Evaluation (Signed)
  Anesthesia Post-op Note  Patient: Kim Contreras  Procedure(s) Performed: Procedure(s): LAPAROSCOPIC CHOLECYSTECTOMY (N/A)  Anesthesia type:General  Patient location: PACU  Post pain: Pain level controlled  Post assessment: Post-op Vital signs reviewed, Patient's Cardiovascular Status Stable, Respiratory Function Stable, Patent Airway and No signs of Nausea or vomiting  Post vital signs: Reviewed and stable  Last Vitals:  Filed Vitals:   01/19/15 1117  BP: 132/79  Pulse: 57  Temp:   Resp: 18    Level of consciousness: awake, alert  and patient cooperative  Complications: No apparent anesthesia complications

## 2015-05-03 ENCOUNTER — Other Ambulatory Visit: Payer: Self-pay | Admitting: Primary Care

## 2015-05-03 DIAGNOSIS — Z1231 Encounter for screening mammogram for malignant neoplasm of breast: Secondary | ICD-10-CM

## 2015-05-10 ENCOUNTER — Ambulatory Visit
Admission: RE | Admit: 2015-05-10 | Discharge: 2015-05-10 | Disposition: A | Discharge status: Home / Self Care | Payer: Commercial Managed Care - PPO | Source: Ambulatory Visit | Attending: Primary Care | Admitting: Primary Care

## 2015-05-10 DIAGNOSIS — Z1231 Encounter for screening mammogram for malignant neoplasm of breast: Secondary | ICD-10-CM | POA: Diagnosis present

## 2016-05-16 ENCOUNTER — Ambulatory Visit
Admission: RE | Admit: 2016-05-16 | Discharge: 2016-05-16 | Disposition: A | Discharge status: Home / Self Care | Payer: Commercial Managed Care - PPO | Source: Ambulatory Visit | Attending: Family Medicine | Admitting: Family Medicine

## 2016-05-16 ENCOUNTER — Other Ambulatory Visit: Payer: Self-pay | Admitting: Family Medicine

## 2016-05-16 DIAGNOSIS — M25561 Pain in right knee: Secondary | ICD-10-CM | POA: Insufficient documentation

## 2016-07-11 ENCOUNTER — Ambulatory Visit
Admission: RE | Admit: 2016-07-11 | Discharge: 2016-07-11 | Disposition: A | Discharge status: Home / Self Care | Payer: Commercial Managed Care - PPO | Source: Ambulatory Visit | Attending: Allergy | Admitting: Allergy

## 2016-07-11 ENCOUNTER — Other Ambulatory Visit: Payer: Self-pay | Admitting: Allergy

## 2016-07-11 DIAGNOSIS — R059 Cough, unspecified: Secondary | ICD-10-CM

## 2016-07-11 DIAGNOSIS — R05 Cough: Secondary | ICD-10-CM

## 2017-04-23 ENCOUNTER — Other Ambulatory Visit: Payer: Self-pay | Admitting: Primary Care

## 2017-04-23 DIAGNOSIS — Z1231 Encounter for screening mammogram for malignant neoplasm of breast: Secondary | ICD-10-CM

## 2017-05-18 ENCOUNTER — Ambulatory Visit
Admission: RE | Admit: 2017-05-18 | Discharge: 2017-05-18 | Disposition: A | Discharge status: Home / Self Care | Payer: Commercial Managed Care - PPO | Source: Ambulatory Visit | Attending: Primary Care | Admitting: Primary Care

## 2017-05-18 DIAGNOSIS — Z1231 Encounter for screening mammogram for malignant neoplasm of breast: Secondary | ICD-10-CM | POA: Insufficient documentation

## 2018-03-05 ENCOUNTER — Other Ambulatory Visit: Payer: Self-pay | Admitting: Otolaryngology

## 2018-03-05 DIAGNOSIS — H9319 Tinnitus, unspecified ear: Secondary | ICD-10-CM

## 2018-03-18 ENCOUNTER — Ambulatory Visit
Admission: RE | Admit: 2018-03-18 | Discharge: 2018-03-18 | Disposition: A | Discharge status: Home / Self Care | Payer: Commercial Managed Care - PPO | Source: Ambulatory Visit | Attending: Otolaryngology | Admitting: Otolaryngology

## 2018-03-18 DIAGNOSIS — H9319 Tinnitus, unspecified ear: Secondary | ICD-10-CM

## 2018-03-29 ENCOUNTER — Other Ambulatory Visit: Payer: Self-pay | Admitting: Otolaryngology

## 2018-03-29 DIAGNOSIS — H9319 Tinnitus, unspecified ear: Secondary | ICD-10-CM

## 2018-04-04 ENCOUNTER — Ambulatory Visit
Admission: RE | Admit: 2018-04-04 | Discharge: 2018-04-04 | Disposition: A | Discharge status: Home / Self Care | Payer: Commercial Managed Care - PPO | Source: Ambulatory Visit | Attending: Otolaryngology | Admitting: Otolaryngology

## 2018-04-04 DIAGNOSIS — H9319 Tinnitus, unspecified ear: Secondary | ICD-10-CM

## 2018-04-04 MED ORDER — GADOBENATE DIMEGLUMINE 529 MG/ML IV SOLN
10.0000 mL | Freq: Once | INTRAVENOUS | Status: AC | PRN
  Administered 2018-04-04: 10 mL via INTRAVENOUS

## 2018-05-25 ENCOUNTER — Other Ambulatory Visit: Payer: Self-pay | Admitting: Internal Medicine

## 2018-05-25 DIAGNOSIS — Z1231 Encounter for screening mammogram for malignant neoplasm of breast: Secondary | ICD-10-CM

## 2018-06-26 ENCOUNTER — Other Ambulatory Visit: Payer: Self-pay

## 2018-06-26 ENCOUNTER — Emergency Department
Admission: EM | Admit: 2018-06-26 | Discharge: 2018-06-27 | Disposition: A | Discharge status: Home / Self Care | Payer: Commercial Managed Care - PPO | Attending: Emergency Medicine | Admitting: Emergency Medicine

## 2018-06-26 ENCOUNTER — Emergency Department: Payer: Commercial Managed Care - PPO

## 2018-06-26 DIAGNOSIS — I10 Essential (primary) hypertension: Secondary | ICD-10-CM | POA: Diagnosis not present

## 2018-06-26 DIAGNOSIS — J069 Acute upper respiratory infection, unspecified: Secondary | ICD-10-CM | POA: Diagnosis not present

## 2018-06-26 DIAGNOSIS — Z79899 Other long term (current) drug therapy: Secondary | ICD-10-CM | POA: Diagnosis not present

## 2018-06-26 DIAGNOSIS — R05 Cough: Secondary | ICD-10-CM | POA: Diagnosis present

## 2018-06-26 DIAGNOSIS — B9789 Other viral agents as the cause of diseases classified elsewhere: Secondary | ICD-10-CM

## 2018-06-26 NOTE — ED Notes (Signed)
X-ray at bedside

## 2018-06-26 NOTE — ED Notes (Signed)
Per MD Karma Greaser, chest xray only at this time.

## 2018-06-26 NOTE — ED Triage Notes (Signed)
Pt arrived via POV from home with c/o cough. Pt states she went to doctor on the 3/24 and was sent home with medications for cough that include albuterol inhaler, fluticasone nasal spray, and azithromycin tablets. Pt states that she is still having some "chest tightness" and some "phlegm".

## 2018-06-26 NOTE — ED Notes (Signed)
Pt is Spanish speaking only; daughter reports pt ran fevers for 2 days about a week ago; now with continued cough and chest tightness

## 2018-06-27 MED ORDER — DOXYCYCLINE HYCLATE 100 MG PO CAPS: 100.0000 mg | ORAL_CAPSULE | Freq: Two times a day (BID) | ORAL | 0 refills | Status: AC

## 2018-06-27 MED ORDER — DEXAMETHASONE 10 MG/ML FOR PEDIATRIC ORAL USE
10.0000 mg | Freq: Once | INTRAMUSCULAR | Status: AC
  Administered 2018-06-27: 10 mg via ORAL
  Filled 2018-06-27: qty 1

## 2018-06-27 MED ORDER — HYDROCODONE-HOMATROPINE 5-1.5 MG/5ML PO SYRP: 5.0000 mL | ORAL_SOLUTION | Freq: Four times a day (QID) | ORAL | 0 refills | Status: DC | PRN

## 2018-06-27 NOTE — Discharge Instructions (Addendum)
You have been seen in the Emergency Department (ED) today for a likely viral illness.  We are giving you another antibiotic just in case this is a bacterial infection. Please drink plenty of clear fluids (water, Gatorade, chicken broth, etc).  You may use Tylenol and/or Motrin according to label instructions.  You can alternate between the two without any side effects.  Please finish your antibiotics (azithromycin) and then take the full course of medication prescribed tonight (doxycycline twice daily for 10 days).  We also gave you a one-time dose of a medication called Decadron which may help you.  Please follow up with your doctor as listed above.  Call your doctor or return to the Emergency Department (ED) if you are unable to tolerate fluids due to vomiting, have worsening trouble breathing, become extremely tired or difficult to awaken, or if you develop any other symptoms that concern you.

## 2018-06-27 NOTE — ED Provider Notes (Signed)
[facility] Emergency Department Provider Note  ____________________________________________   First MD Initiated Contact with Patient 06/26/18 2338     (approximate)  I have reviewed the triage vital signs and the nursing notes.   HISTORY  Chief Complaint Cough   The patient and/or family speak(s) Spanish.  They understand they have the right to the use of a hospital interpreter, however at this time they prefer to speak directly with me in [place].  They know that they can ask for an interpreter at any time.   HPI Kim Contreras is a 58 y.o. female with medical history as listed below with medical history as listed below which includes significant seasonal and environmental allergies but no history of lung disease.  She reports that she has had symptoms for about 5 or 6 days that include cough, some shortness of breath, some wheezing, some nasal congestion and runny nose.  It feels similar to prior allergies but worse and she describes the symptoms as severe.  She reports that she had a subjective fever about 2 days in a row but that was about 5 or 6 days ago and has had no fever since then.  She denies nausea, vomiting, chest pain (although she has had some chest tightness), abdominal pain, and dysuria.  She has seen her regular doctor who prescribed fluticasone nasal spray and an albuterol inhaler as well as a Z-Pak but she says that she has only 1 tablet left of the Z-Pak and she is not feeling better.  She has not been doing any traveling recently and although she continues to work, she said that no one at work has been ill, no one has been traveling including to [place] high risk locations, and she has not been around anyone who is tested positive for COVID-19.    Past Medical History:  Diagnosis Date   Arthritis    Helicobacter pylori gastritis    History of hiatal hernia    Hypertension     There are no active problems to display for this  patient.   Past Surgical History:  Procedure Laterality Date   APPENDECTOMY     CHOLECYSTECTOMY N/A 01/19/2015   Procedure: LAPAROSCOPIC CHOLECYSTECTOMY;  [name], MD;  [name];  Service: General;  [name];   COLONOSCOPY WITH PROPOFOL N/A 10/27/2014   Procedure: COLONOSCOPY WITH PROPOFOL;  [name], MD;  Location: [facility] ENDOSCOPY;  Service: Endoscopy;  [name];   ESOPHAGOGASTRODUODENOSCOPY     NASAL SINUS SURGERY     TUBAL LIGATION      Prior to Admission medications   Medication Sig Start Date End Date Taking? Authorizing Provider  doxycycline (VIBRAMYCIN) 100 MG capsule Take 1 capsule (100 mg total) by mouth 2 (two) times daily for 10 days. 06/27/18 07/07/18  Hinda Kehr, MD  HYDROcodone-acetaminophen (NORCO) 5-325 MG tablet Take 1-2 tablets by mouth every 4 (four) hours as needed for moderate pain. 01/19/15   Leonie Green, MD  HYDROcodone-homatropine [facility]) 5-1.5 MG/5ML syrup Take 5 mLs by mouth every 6 (six) hours as needed for cough. 06/27/18   Hinda Kehr, MD  levocetirizine (XYZAL) 5 MG tablet Take 5 mg by mouth every evening.    [provider]  lisinopril (PRINIVIL,ZESTRIL) 10 MG tablet Take 10 mg by mouth daily.    [provider]  omeprazole (PRILOSEC) 20 MG capsule Take 20 mg by mouth daily.    [provider]    Allergies Patient  has no known allergies.  Family History  Problem Relation Age of Onset   Breast cancer Other 39    Social History Social History   Tobacco Use   Smoking status: Never Smoker   Smokeless tobacco: Never Used  Substance Use Topics   Alcohol use: No   Drug use: No    Review of Systems Constitutional:    -- General malaise:  No    -- Myalgias/bodyaches:  No    -- Fever/chills:  Yes but not for several days   --  Eyes: No visual changes. ENT:    --  Nasal congestion/runny nose:  Yes    --  Sore throat:  No    --   Earaches:  No  Cardiovascular:    --  Chest pain:  No   Worse with deep inspiration or movement:  not applicable   --  Syncope / passing out:  No  Respiratory:   --  Shortness of breath:  Yes    --  Cough:  Yes  Productive?:  yes   --  Wheezing:  Yes  Gastrointestinal:    --  Abdominal pain:  No    --  Nausea and/or vomiting:  No    --  Diarrhea:  No    --   Genitourinary: Negative for dysuria and hematuria. Musculoskeletal: No complaints at this time. Integumentary: Negative for rash, abrasions, and lacerations. Neurological:    --  Headache: No.    --  Focal weakness or numbness:  No Psychiatric: No acute concerns at this time.  ____________________________________________   PHYSICAL EXAM:  VITAL SIGNS: ED Triage Vitals  Enc Vitals Group     BP 06/26/18 2249 (!) 173/97     Pulse Rate 06/26/18 2249 73     Resp 06/26/18 2249 18     Temp 06/26/18 2249 97.9 F (36.6 C)     Temp Source 06/26/18 2249 Oral     SpO2 06/26/18 2249 97 %     Weight 06/26/18 2243 59 kg (130 lb)     Height 06/26/18 2243 1.524 m (5')     Head Circumference --      Peak Flow --      Pain Score 06/26/18 2243 5     Pain Loc --      Pain Edu? --      Excl. in [place]? --     Constitutional: Alert and oriented.  Well appearing and in no acute distress Eyes: Conjunctivae are normal.  Head: Atraumatic. Nose: Mild congestion/rhinnorhea. Cardiovascular: Normal rate, regular rhythm. Good peripheral circulation. Respiratory: No retractions, no accessory muscle usage, normal rate, no audible wheezing Musculoskeletal: No lower extremity tenderness nor edema. No gross deformities of extremities. Neurologic:  Normal speech and language. No gross focal neurologic deficits are appreciated.  Skin:  Skin is warm, dry and intact. No rash noted. Psychiatric: Mood and affect are normal under the circumstances.  No acute/emergent concerns nor abnormalities.  ____________________________________________    LABS (all labs ordered are listed, but only abnormal results are displayed)  Labs Reviewed - No data to display ____________________________________________  EKG  No indication for EKG ____________________________________________  RADIOLOGY I, Hinda Kehr, personally viewed and evaluated these images (plain radiographs) as part of my medical decision making, as well as reviewing the written report by the radiologist.  ED MD interpretation: Bronchitis, viral pattern versus atypical infection  Official radiology report(s): Dg Chest Portable 1 View  Result Date: 06/26/2018 CLINICAL DATA:  Cough. EXAM:  PORTABLE CHEST 1 VIEW COMPARISON:  07/11/2016 FINDINGS: The cardiomediastinal contours are normal. Mild interstitial prominence. Pulmonary vasculature is normal. No consolidation, pleural effusion, or pneumothorax. No acute osseous abnormalities are seen. IMPRESSION: Mild interstitial prominence can be seen with bronchitis or atypical infection. Electronically Signed   By: Keith Rake M.D.   On: 06/26/2018 23:47    ____________________________________________   PROCEDURES   Procedure(s) performed (including Critical Care):  Procedures   ____________________________________________   INITIAL IMPRESSION / MDM / [place] / ED COURSE  As part of my medical decision making, I reviewed the following data within the [place] notes reviewed and incorporated, Old chart reviewed, Radiograph reviewed  and Notes from prior ED visits   Kim Contreras was evaluated in Emergency Department on 06/27/2018 for the symptoms described in the history of present illness. She was evaluated in the context of the global COVID-19 pandemic, which necessitated consideration that the patient might be at risk for infection with the SARS-CoV-2 virus that causes COVID-19. Institutional protocols and algorithms that pertain to the evaluation of patients at risk for  COVID-19 are in a state of rapid change based on information released by regulatory bodies including the CDC and federal and state organizations. These policies and algorithms were followed during the patient's care in the ED.  ---------------------    Differential diagnosis includes, but is not limited to, nonspecific viral illness, community-acquired pneumonia, COVID-19, allergies.  The patient is in no distress and has normal vital signs and is afebrile.  In fact she reports having a subjective fever days ago but has not had any subjective fever for several days.  She is low risk for COVID-19 and I do not believe that she requires self quarantine or isolation.  Her chest x-ray is suggestive of viral pattern and she has severe seasonal/environmental allergies that are being treated.  Given some persistent x-ray abnormalities and a persistent productive cough, I will give her a prescription for doxycycline to take after she finishes the azithromycin, but I think she is very appropriate for outpatient treatment and follow-up.  I gave my usual customary return precautions.  She does not meet any criteria for COVID-19 testing at this time.     ____________________________________________  FINAL CLINICAL IMPRESSION(S) / ED DIAGNOSES  Final diagnoses:  Viral URI with cough     MEDICATIONS GIVEN DURING THIS VISIT:  Medications  dexamethasone (DECADRON) 10 MG/ML injection for Pediatric ORAL use 10 mg (10 mg Oral Given 06/27/18 0051)     ED Discharge Orders         Ordered    doxycycline (VIBRAMYCIN) 100 MG capsule  2 times daily     06/27/18 0020    HYDROcodone-homatropine (HYCODAN) 5-1.5 MG/5ML syrup  Every 6 hours PRN     06/27/18 0022           Note:  This document was prepared using Dragon voice recognition software and may include unintentional dictation errors.   Hinda Kehr, MD 06/27/18 [phone]

## 2018-10-19 ENCOUNTER — Other Ambulatory Visit: Payer: Self-pay

## 2018-10-19 DIAGNOSIS — Z20822 Contact with and (suspected) exposure to covid-19: Secondary | ICD-10-CM

## 2018-10-21 LAB — NOVEL CORONAVIRUS, NAA: SARS-CoV-2, NAA: NOT DETECTED

## 2019-05-07 ENCOUNTER — Encounter: Payer: Self-pay | Admitting: Emergency Medicine

## 2019-05-07 ENCOUNTER — Telehealth: Payer: Self-pay | Admitting: Internal Medicine

## 2019-05-07 ENCOUNTER — Emergency Department
Admission: EM | Admit: 2019-05-07 | Discharge: 2019-05-07 | Disposition: A | Discharge status: Home / Self Care | Payer: Commercial Managed Care - PPO | Attending: Emergency Medicine | Admitting: Emergency Medicine

## 2019-05-07 ENCOUNTER — Emergency Department: Payer: Commercial Managed Care - PPO

## 2019-05-07 DIAGNOSIS — R1900 Intra-abdominal and pelvic swelling, mass and lump, unspecified site: Secondary | ICD-10-CM | POA: Diagnosis not present

## 2019-05-07 DIAGNOSIS — I10 Essential (primary) hypertension: Secondary | ICD-10-CM | POA: Insufficient documentation

## 2019-05-07 DIAGNOSIS — Z79899 Other long term (current) drug therapy: Secondary | ICD-10-CM | POA: Diagnosis not present

## 2019-05-07 DIAGNOSIS — R1030 Lower abdominal pain, unspecified: Secondary | ICD-10-CM | POA: Diagnosis present

## 2019-05-07 LAB — COMPREHENSIVE METABOLIC PANEL
ALT: 15 U/L (ref 0–44)
AST: 16 U/L (ref 15–41)
Albumin: 4.6 g/dL (ref 3.5–5.0)
Alkaline Phosphatase: 64 U/L (ref 38–126)
Anion gap: 8 (ref 5–15)
BUN: 15 mg/dL (ref 6–20)
CO2: 28 mmol/L (ref 22–32)
Calcium: 9.3 mg/dL (ref 8.9–10.3)
Chloride: 104 mmol/L (ref 98–111)
Creatinine, Ser: 0.84 mg/dL (ref 0.44–1.00)
GFR calc Af Amer: >60 mL/min (ref >60)
GFR calc non Af Amer: >60 mL/min (ref >60)
Glucose, Bld: 119 mg/dL — ABNORMAL HIGH (ref 70–99)
Potassium: 3.6 mmol/L (ref 3.5–5.1)
Sodium: 140 mmol/L (ref 135–145)
Total Bilirubin: 0.5 mg/dL (ref 0.3–1.2)
Total Protein: 8.2 g/dL — ABNORMAL HIGH (ref 6.5–8.1)

## 2019-05-07 LAB — CBC
HCT: 38.3 % (ref 36.0–46.0)
Hemoglobin: 13.4 g/dL (ref 12.0–15.0)
MCH: 32.5 pg (ref 26.0–34.0)
MCHC: 35 g/dL (ref 30.0–36.0)
MCV: 93 fL (ref 80.0–100.0)
Platelets: 264 10*3/uL (ref 150–400)
RBC: 4.12 MIL/uL (ref 3.87–5.11)
RDW: 12.7 % (ref 11.5–15.5)
WBC: 4.7 10*3/uL (ref 4.0–10.5)
nRBC: 0 % (ref 0.0–0.2)

## 2019-05-07 LAB — LIPASE, BLOOD: Lipase: 38 U/L (ref 11–51)

## 2019-05-07 MED ORDER — OXYCODONE-ACETAMINOPHEN 5-325 MG PO TABS: 1.0000 | ORAL_TABLET | Freq: Four times a day (QID) | ORAL | 0 refills | Status: DC | PRN

## 2019-05-07 MED ORDER — KETOROLAC TROMETHAMINE 30 MG/ML IJ SOLN
15.0000 mg | INTRAMUSCULAR | Status: AC
  Administered 2019-05-07: 15 mg via INTRAVENOUS
  Filled 2019-05-07: qty 1

## 2019-05-07 MED ORDER — IOHEXOL 300 MG/ML  SOLN
100.0000 mL | Freq: Once | INTRAMUSCULAR | Status: AC | PRN
  Administered 2019-05-07: 18:00:00 100 mL via INTRAVENOUS

## 2019-05-07 MED ORDER — SENNOSIDES-DOCUSATE SODIUM 8.6-50 MG PO TABS: 1.0000 | ORAL_TABLET | Freq: Two times a day (BID) | ORAL | 0 refills | Status: DC

## 2019-05-07 MED ORDER — ONDANSETRON 4 MG PO TBDP: 4.0000 mg | ORAL_TABLET | Freq: Three times a day (TID) | ORAL | 0 refills | Status: DC | PRN

## 2019-05-07 MED ORDER — SODIUM CHLORIDE 0.9 % IV BOLUS
1000.0000 mL | Freq: Once | INTRAVENOUS | Status: AC
  Administered 2019-05-07: 18:00:00 1000 mL via INTRAVENOUS

## 2019-05-07 MED ORDER — SODIUM CHLORIDE 0.9% FLUSH: 3.0000 mL | Freq: Once | INTRAVENOUS | Status: DC

## 2019-05-07 NOTE — ED Provider Notes (Signed)
[facility] Emergency Department Provider Note  ____________________________________________  Time seen: Approximately 6:42 PM  I have reviewed the triage vital signs and the nursing notes.   HISTORY  Chief Complaint Abdominal Pain  Spanish video interpreter used throughout encounter.  HPI BRIGGITTE Contreras is a 59 y.o. female with a past history of hypertension and GERD who comes the ED complaining of diffuse lower abdominal pain for the past 4 months, waxing and waning, radiates to the back and down her right leg.  No specific aggravating or alleviating factors.  Slightly decreased oral intake but overall eating and drinking normally without impacting the symptoms.  No fevers or chills or weight changes.  Does feel like she has urinary frequency without dysuria.  No abnormal vaginal bleeding or discharge  Reports seeing her doctor who advised NSAIDs which has not relieved the pain.  Review of electronic medical record shows that she went to the [facility] ED about 2 weeks ago.  Labs showed an elevated lipase but were otherwise unremarkable.  An abdominal ultrasound done at that time was unremarkable.   Past Medical History:  Diagnosis Date  . Arthritis   . Helicobacter pylori gastritis   . History of hiatal hernia   . Hypertension      There are no problems to display for this patient.    Past Surgical History:  Procedure Laterality Date  . APPENDECTOMY    . CHOLECYSTECTOMY N/A 01/19/2015   Procedure: LAPAROSCOPIC CHOLECYSTECTOMY;  [name], MD;  [name];  Service: General;  [name];  . COLONOSCOPY WITH PROPOFOL N/A 10/27/2014   Procedure: COLONOSCOPY WITH PROPOFOL;  [name], MD;  Location: [facility] ENDOSCOPY;  Service: Endoscopy;  [name];  . ESOPHAGOGASTRODUODENOSCOPY    . NASAL SINUS SURGERY    . TUBAL LIGATION       Prior to Admission medications   Medication Sig Start Date End Date  Taking? Authorizing Provider  levocetirizine (XYZAL) 5 MG tablet Take 5 mg by mouth every evening.    [provider]  montelukast (SINGULAIR) 10 MG tablet Take 10 mg by mouth at bedtime. 02/28/19   [provider]  omeprazole (PRILOSEC) 20 MG capsule Take 20 mg by mouth daily.    [provider]  ondansetron (ZOFRAN ODT) 4 MG disintegrating tablet Take 1 tablet (4 mg total) by mouth every 8 (eight) hours as needed for nausea or vomiting. 05/07/19   Carrie Mew, MD  oxyCODONE-acetaminophen (PERCOCET) 5-325 MG tablet Take 1 tablet by mouth every 6 (six) hours as needed for severe pain. 05/07/19 05/06/20  Carrie Mew, MD  senna-docusate (SENOKOT-S) 8.6-50 MG tablet Take 1 tablet by mouth 2 (two) times daily. 05/07/19   Carrie Mew, MD     Allergies Patient has no known allergies.   Family History  Problem Relation Age of Onset  . Breast cancer Other 42    Social History Social History   Tobacco Use  . Smoking status: Never Smoker  . Smokeless tobacco: Never Used  Substance Use Topics  . Alcohol use: No  . Drug use: No    Review of Systems  Constitutional:   No fever or chills.  ENT:   No sore throat. No rhinorrhea. Cardiovascular:   No chest pain or syncope. Respiratory:   No dyspnea or cough. Gastrointestinal:   Positive as above for abdominal pain.  No vomiting or diarrhea.  Positive constipation, BM every 3 days. Musculoskeletal:   Negative for focal  pain or swelling All other systems reviewed and are negative except as documented above in ROS and HPI.  ____________________________________________   PHYSICAL EXAM:  VITAL SIGNS: ED Triage Vitals  Enc Vitals Group     BP 05/07/19 1616 (!) 158/92     Pulse Rate 05/07/19 1616 75     Resp 05/07/19 1616 18     Temp 05/07/19 1616 98.1 F (36.7 C)     Temp Source 05/07/19 1616 Oral     SpO2 05/07/19 1616 99 %     Weight 05/07/19 1619 138 lb (62.6 kg)     Height 05/07/19 1619 5'  (1.524 m)     Head Circumference --      Peak Flow --      Pain Score 05/07/19 1619 9     Pain Loc --      Pain Edu? --      Excl. in [place]? --     Vital signs reviewed, nursing assessments reviewed.   Constitutional:   Alert and oriented. Non-toxic appearance. Eyes:   Conjunctivae are normal. EOMI. PERRL. ENT      Head:   Normocephalic and atraumatic.      Nose:   Wearing a mask.      Mouth/Throat:   Wearing a mask.      Neck:   No meningismus. Full ROM. Hematological/Lymphatic/Immunilogical:   No cervical lymphadenopathy. Cardiovascular:   RRR. Symmetric bilateral radial and DP pulses.  No murmurs. Cap refill less than 2 seconds. Respiratory:   Normal respiratory effort without tachypnea/retractions. Breath sounds are clear and equal bilaterally. No wheezes/rales/rhonchi. Gastrointestinal:   Soft with mild diffuse lower abdominal tenderness and epigastric tenderness.. Non distended. There is no CVA tenderness.  No rebound, rigidity, or guarding. Musculoskeletal:   Normal range of motion in all extremities. No joint effusions.  No lower extremity tenderness.  No edema. Neurologic:   Normal speech and language.  Motor grossly intact. No acute focal neurologic deficits are appreciated.  Skin:    Skin is warm, dry and intact. No rash noted.  No petechiae, purpura, or bullae.  ____________________________________________    LABS (pertinent positives/negatives) (all labs ordered are listed, but only abnormal results are displayed) Labs Reviewed  COMPREHENSIVE METABOLIC PANEL - Abnormal; Notable for the following components:      Result Value   Glucose, Bld 119 (*)    Total Protein 8.2 (*)    All other components within normal limits  LIPASE, BLOOD  CBC  URINALYSIS, COMPLETE (UACMP) WITH MICROSCOPIC   ____________________________________________   EKG    ____________________________________________    RADIOLOGY  CT ABDOMEN PELVIS W CONTRAST  Result Date:  05/07/2019 CLINICAL DATA:  Nausea vomiting and abdominal pain. EXAM: CT ABDOMEN AND PELVIS WITH CONTRAST TECHNIQUE: Multidetector CT imaging of the abdomen and pelvis was performed using the standard protocol following bolus administration of intravenous contrast. CONTRAST:  158mL OMNIPAQUE IOHEXOL 300 MG/ML  SOLN COMPARISON:  05/18/2014 FINDINGS: Lower chest: Atelectasis noted in the lung bases. Hepatobiliary: No suspicious focal abnormality within the liver parenchyma. Gallbladder is surgically absent. No intrahepatic or extrahepatic biliary dilation. Pancreas: No focal mass lesion. No dilatation of the main duct. No intraparenchymal cyst. No peripancreatic edema. Spleen: No splenomegaly. No focal mass lesion. Adrenals/Urinary Tract: No adrenal nodule or mass. Kidneys unremarkable. No evidence for hydroureter. The urinary bladder appears normal for the degree of distention. Stomach/Bowel: Stomach is unremarkable. No gastric wall thickening. No evidence of outlet obstruction. Duodenum is normally positioned as is  the ligament of Treitz. No small bowel wall thickening. No small bowel dilatation. The terminal ileum is normal. Nonvisualization of the appendix is consistent with the reported history of appendectomy. No gross colonic mass. No colonic wall thickening. Diverticular changes are noted in the left colon without evidence of diverticulitis. Vascular/Lymphatic: No abdominal aortic aneurysm. 9 mm short axis right retrocrural lymph node identified. Numerous para-aortic lymph nodes are noted, measuring up to about 9 mm short axis in the left para-aortic space. Insert no pelvic sidewall lymphadenopathy Reproductive: The uterus is unremarkable.  There is no adnexal mass. Other: Abnormal soft tissue is identified in the central small bowel mesentery, encasing the vascular anatomy. 9.3 x 3.0 cm central mesenteric soft tissue mass is identified on image 38/series 2. 3.0 x 3.0 cm collar of soft tissue surrounds branches  of the superior mesenteric artery and vein on image 47/2 with relatively little mass-effect on the vascular anatomy. 2.0 x 1.5 cm nodular component of this abnormal soft tissue is identified in the mesentery of the right pelvis on 55/2. Musculoskeletal: No worrisome lytic or sclerotic osseous abnormality. IMPRESSION: 1. 9.3 x 3.0 cm central mesenteric soft tissue mass encasing branches of the superior mesenteric artery and vein. Other relatively bulky areas of abnormal mesenteric soft tissue are associated. Imaging features are highly suspicious for lymphoma. 2. Numerous upper normal retroperitoneal lymph nodes, also compatible with lymphoma. 3. Left colonic diverticulosis without diverticulitis. Electronically Signed   By: Misty Stanley M.D.   On: 05/07/2019 18:28    ____________________________________________   PROCEDURES Procedures  ____________________________________________  DIFFERENTIAL DIAGNOSIS   Tumor, constipation, functional abdominal pain, cystitis, diverticulitis, pancreatitis, ovarian cyst  CLINICAL IMPRESSION / ASSESSMENT AND PLAN / ED COURSE  Medications ordered in the ED: Medications  sodium chloride 0.9 % bolus 1,000 mL (1,000 mLs Intravenous New Bag/Given 05/07/19 1818)  ketorolac (TORADOL) 30 MG/ML injection 15 mg (15 mg Intravenous Given 05/07/19 1818)  iohexol (OMNIPAQUE) 300 MG/ML solution 100 mL (100 mLs Intravenous Contrast Given 05/07/19 1802)    Pertinent labs & imaging results that were available during my care of the patient were reviewed by me and considered in my medical decision making (see chart for details).  KRYSTALIN MONCION was evaluated in Emergency Department on 05/07/2019 for the symptoms described in the history of present illness. She was evaluated in the context of the global COVID-19 pandemic, which necessitated consideration that the patient might be at risk for infection with the SARS-CoV-2 virus that causes COVID-19. Institutional protocols and  algorithms that pertain to the evaluation of patients at risk for COVID-19 are in a state of rapid change based on information released by regulatory bodies including the CDC and federal and state organizations. These policies and algorithms were followed during the patient's care in the ED.   Patient presents with 4 months of nonlocalized abdominal pain.  Concerning for malignancy.  Will obtain a CT scan to further evaluate given nondiagnostic work-up and no relief with conservative therapy.  Clinical Course as of May 07 1943  Sat May 07, 2019  1839 CT reveals 9cm mesenteric soft tissue mass and bulky lymphadenopathy concerning for lymphoma. Will d/w onc for recs.   [PS]    Clinical Course User Index [PS] Carrie Mew, MD     ----------------------------------------- 7:44 PM on 05/07/2019 -----------------------------------------  CT findings discussed with oncology Dr. Rogue Bussing who will plan to follow-up with her in clinic in 2 days given her normal vital signs and normal labs and manageable pain.  After Toradol patient states that her pain is very much improved.  Recommended she continue NSAIDs and have sent a prescription for Percocet to use as needed.  Using a [place] interpreter, informed the patient of all the CT findings and the concerns for lymphoma which she understands and does plan to follow-up with oncology in 2 days.  ____________________________________________   FINAL CLINICAL IMPRESSION(S) / ED DIAGNOSES    Final diagnoses:  Abdominal mass, unspecified abdominal location  Lower abdominal pain     ED Discharge Orders         Ordered    oxyCODONE-acetaminophen (PERCOCET) 5-325 MG tablet  Every 6 hours PRN     05/07/19 1940    senna-docusate (SENOKOT-S) 8.6-50 MG tablet  2 times daily     05/07/19 1940    ondansetron (ZOFRAN ODT) 4 MG disintegrating tablet  Every 8 hours PRN     05/07/19 1940          Portions of this note were generated with dragon  dictation software. Dictation errors may occur despite best attempts at proofreading.   Carrie Mew, MD 05/07/19 1946

## 2019-05-07 NOTE — ED Notes (Signed)
ED MD at patient bedside

## 2019-05-07 NOTE — ED Triage Notes (Signed)
Using the interpreter Pt to ED with c/o of abdominal pain that radiates to lower back and down right leg. Pt states leg is "inflamed". Pt states has been ongoing for approx 4 month. Pt denies NVD.

## 2019-05-07 NOTE — Discharge Instructions (Signed)
Your CT scan today shows a 9 cm mass in the abdominal cavity that is concerning for lymphoma.  The oncology clinic will plan to follow-up with you on Monday to further evaluate this.  They should call you Monday morning to set up time for the appointment.  Your test results from today are listed below.  Results for orders placed or performed during the hospital encounter of 05/07/19  Lipase, blood  Result Value Ref Range   Lipase 38 11 - 51 U/L  Comprehensive metabolic panel  Result Value Ref Range   Sodium 140 135 - 145 mmol/L   Potassium 3.6 3.5 - 5.1 mmol/L   Chloride 104 98 - 111 mmol/L   CO2 28 22 - 32 mmol/L   Glucose, Bld 119 (H) 70 - 99 mg/dL   BUN 15 6 - 20 mg/dL   Creatinine, Ser 0.84 0.44 - 1.00 mg/dL   Calcium 9.3 8.9 - 10.3 mg/dL   Total Protein 8.2 (H) 6.5 - 8.1 g/dL   Albumin 4.6 3.5 - 5.0 g/dL   AST 16 15 - 41 U/L   ALT 15 0 - 44 U/L   Alkaline Phosphatase 64 38 - 126 U/L   Total Bilirubin 0.5 0.3 - 1.2 mg/dL   GFR calc non Af Amer >60 >60 mL/min   GFR calc Af Amer >60 >60 mL/min   Anion gap 8 5 - 15  CBC  Result Value Ref Range   WBC 4.7 4.0 - 10.5 K/uL   RBC 4.12 3.87 - 5.11 MIL/uL   Hemoglobin 13.4 12.0 - 15.0 g/dL   HCT 38.3 36.0 - 46.0 %   MCV 93.0 80.0 - 100.0 fL   MCH 32.5 26.0 - 34.0 pg   MCHC 35.0 30.0 - 36.0 g/dL   RDW 12.7 11.5 - 15.5 %   Platelets 264 150 - 400 K/uL   nRBC 0.0 0.0 - 0.2 %   CT ABDOMEN PELVIS W CONTRAST  Result Date: 05/07/2019 CLINICAL DATA:  Nausea vomiting and abdominal pain. EXAM: CT ABDOMEN AND PELVIS WITH CONTRAST TECHNIQUE: Multidetector CT imaging of the abdomen and pelvis was performed using the standard protocol following bolus administration of intravenous contrast. CONTRAST:  139mL OMNIPAQUE IOHEXOL 300 MG/ML  SOLN COMPARISON:  05/18/2014 FINDINGS: Lower chest: Atelectasis noted in the lung bases. Hepatobiliary: No suspicious focal abnormality within the liver parenchyma. Gallbladder is surgically absent. No intrahepatic  or extrahepatic biliary dilation. Pancreas: No focal mass lesion. No dilatation of the main duct. No intraparenchymal cyst. No peripancreatic edema. Spleen: No splenomegaly. No focal mass lesion. Adrenals/Urinary Tract: No adrenal nodule or mass. Kidneys unremarkable. No evidence for hydroureter. The urinary bladder appears normal for the degree of distention. Stomach/Bowel: Stomach is unremarkable. No gastric wall thickening. No evidence of outlet obstruction. Duodenum is normally positioned as is the ligament of Treitz. No small bowel wall thickening. No small bowel dilatation. The terminal ileum is normal. Nonvisualization of the appendix is consistent with the reported history of appendectomy. No gross colonic mass. No colonic wall thickening. Diverticular changes are noted in the left colon without evidence of diverticulitis. Vascular/Lymphatic: No abdominal aortic aneurysm. 9 mm short axis right retrocrural lymph node identified. Numerous para-aortic lymph nodes are noted, measuring up to about 9 mm short axis in the left para-aortic space. Insert no pelvic sidewall lymphadenopathy Reproductive: The uterus is unremarkable.  There is no adnexal mass. Other: Abnormal soft tissue is identified in the central small bowel mesentery, encasing the vascular anatomy. 9.3 x 3.0  cm central mesenteric soft tissue mass is identified on image 38/series 2. 3.0 x 3.0 cm collar of soft tissue surrounds branches of the superior mesenteric artery and vein on image 47/2 with relatively little mass-effect on the vascular anatomy. 2.0 x 1.5 cm nodular component of this abnormal soft tissue is identified in the mesentery of the right pelvis on 55/2. Musculoskeletal: No worrisome lytic or sclerotic osseous abnormality. IMPRESSION: 1. 9.3 x 3.0 cm central mesenteric soft tissue mass encasing branches of the superior mesenteric artery and vein. Other relatively bulky areas of abnormal mesenteric soft tissue are associated. Imaging  features are highly suspicious for lymphoma. 2. Numerous upper normal retroperitoneal lymph nodes, also compatible with lymphoma. 3. Left colonic diverticulosis without diverticulitis. Electronically Signed   By: Misty Stanley M.D.   On: 05/07/2019 18:28

## 2019-05-07 NOTE — Telephone Encounter (Signed)
C- Please have pt follow up on- 2/08; MD at 3:15. Needs interpreter. Dx: possible lymphoma  Call pt to confirm the appt.

## 2019-05-09 ENCOUNTER — Other Ambulatory Visit: Payer: Self-pay

## 2019-05-09 ENCOUNTER — Inpatient Hospital Stay: Payer: Commercial Managed Care - PPO | Attending: Internal Medicine | Admitting: Internal Medicine

## 2019-05-09 ENCOUNTER — Inpatient Hospital Stay: Payer: Commercial Managed Care - PPO

## 2019-05-09 VITALS — BP 161/86 | HR 62 | Temp 98.2°F | Wt 139.0 lb

## 2019-05-09 DIAGNOSIS — Z711 Person with feared health complaint in whom no diagnosis is made: Secondary | ICD-10-CM

## 2019-05-09 DIAGNOSIS — R1900 Intra-abdominal and pelvic swelling, mass and lump, unspecified site: Secondary | ICD-10-CM | POA: Insufficient documentation

## 2019-05-09 DIAGNOSIS — R5383 Other fatigue: Secondary | ICD-10-CM | POA: Insufficient documentation

## 2019-05-09 DIAGNOSIS — R5381 Other malaise: Secondary | ICD-10-CM | POA: Insufficient documentation

## 2019-05-09 DIAGNOSIS — R110 Nausea: Secondary | ICD-10-CM | POA: Insufficient documentation

## 2019-05-09 DIAGNOSIS — C8238 Follicular lymphoma grade IIIa, lymph nodes of multiple sites: Secondary | ICD-10-CM | POA: Diagnosis present

## 2019-05-09 DIAGNOSIS — M1990 Unspecified osteoarthritis, unspecified site: Secondary | ICD-10-CM | POA: Diagnosis not present

## 2019-05-09 DIAGNOSIS — Z79899 Other long term (current) drug therapy: Secondary | ICD-10-CM | POA: Insufficient documentation

## 2019-05-09 DIAGNOSIS — R519 Headache, unspecified: Secondary | ICD-10-CM | POA: Diagnosis not present

## 2019-05-09 DIAGNOSIS — R202 Paresthesia of skin: Secondary | ICD-10-CM | POA: Insufficient documentation

## 2019-05-09 DIAGNOSIS — M549 Dorsalgia, unspecified: Secondary | ICD-10-CM | POA: Insufficient documentation

## 2019-05-09 DIAGNOSIS — I10 Essential (primary) hypertension: Secondary | ICD-10-CM | POA: Insufficient documentation

## 2019-05-09 LAB — LACTATE DEHYDROGENASE: LDH: 134 U/L (ref 98–192)

## 2019-05-09 LAB — HEPATITIS B CORE ANTIBODY, IGM: Hep B C IgM: NONREACTIVE

## 2019-05-09 LAB — PROTIME-INR
INR: 0.9 (ref 0.8–1.2)
Prothrombin Time: 12.4 seconds (ref 11.4–15.2)

## 2019-05-09 LAB — HEPATITIS B SURFACE ANTIGEN: Hepatitis B Surface Ag: NONREACTIVE

## 2019-05-09 LAB — APTT: aPTT: 32 seconds (ref 24–36)

## 2019-05-09 NOTE — Progress Notes (Signed)
[place] NOTE  Patient Care Team: Center, [facility] as PCP - General (General Practice)  CHIEF COMPLAINTS/PURPOSE OF CONSULTATION: MASS in ab  # 5th FEB 2021- ABDOMINAL MASS-  9.3 x 3.0 cm central mesenteric soft tissue mass is identified on image 38/series 2. 3.0x 3.0 cm collar of soft tissue surrounds branches of the superior mesenteric artery and vein on image 47/2 with relatively little mass-effect on the vascular anatomy. 2.0 x 1.5 cm nodular component of this abnormal soft tissue is identified in the mesentery of the right pelvis on 55/2.   Oncology History   No history exists.     HISTORY OF PRESENTING ILLNESS: Patient speaks minimal English/interpreter virtual present. Kim Contreras 59 y.o.  female no significant past medical history-except for chronic mild headaches and hypertension was recently evaluated in the emergency room for worsening abdominal pain and also nausea.  Patient denies any weight loss.  Positive for drenching night sweats.  Complains of pain in the upper/lower abdomen.  Right upper quadrant pain radiates to the right chest.  No significant constipation.  Complains of tingling in the hands.  Chronic headaches.  Fatigue.   Review of Systems  Constitutional: Positive for malaise/fatigue. Negative for chills, diaphoresis, fever and weight loss.  HENT: Negative for nosebleeds and sore throat.   Eyes: Negative for double vision.  Respiratory: Negative for cough, hemoptysis, sputum production, shortness of breath and wheezing.   Cardiovascular: Negative for chest pain, palpitations, orthopnea and leg swelling.  Gastrointestinal: Positive for abdominal pain and nausea. Negative for blood in stool, constipation, diarrhea, heartburn, melena and vomiting.  Genitourinary: Negative for dysuria, frequency and urgency.  Musculoskeletal: Positive for back pain and joint pain.  Skin: Negative.  Negative for itching and rash.   Neurological: Positive for tingling (in hands x 2 months) and headaches. Negative for dizziness, focal weakness and weakness.  Endo/Heme/Allergies: Does not bruise/bleed easily.  Psychiatric/Behavioral: Negative for depression. The patient is not nervous/anxious and does not have insomnia.      MEDICAL HISTORY:  Past Medical History:  Diagnosis Date  . Arthritis   . Helicobacter pylori gastritis   . History of hiatal hernia   . Hypertension     SURGICAL HISTORY: Past Surgical History:  Procedure Laterality Date  . APPENDECTOMY    . CHOLECYSTECTOMY N/A 01/19/2015   Procedure: LAPAROSCOPIC CHOLECYSTECTOMY;  [name], MD;  [name];  Service: General;  [name];  . COLONOSCOPY WITH PROPOFOL N/A 10/27/2014   Procedure: COLONOSCOPY WITH PROPOFOL;  [name], MD;  Location: [facility] ENDOSCOPY;  Service: Endoscopy;  [name];  . ESOPHAGOGASTRODUODENOSCOPY    . NASAL SINUS SURGERY    . TUBAL LIGATION      SOCIAL HISTORY: Social History   Socioeconomic History  . Marital status: Married    Spouse name: Not on file  . Number of children: Not on file  . Years of education: Not on file  . Highest education level: Not on file  Occupational History  . Not on file  Tobacco Use  . Smoking status: Never Smoker  . Smokeless tobacco: Never Used  Substance and Sexual Activity  . Alcohol use: No  . Drug use: No  . Sexual activity: Not on file  Other Topics Concern  . Not on file  Social History Narrative  . Not on file   Social Determinants of Health   Financial Resource Strain:   . Difficulty of Paying Living  Expenses: Not on file  Food Insecurity:   . Worried About Charity fundraiser in the Last Year: Not on file  . Ran Out of Food in the Last Year: Not on file  Transportation Needs:   . Lack of Transportation (Medical): Not on file  . Lack of Transportation (Non-Medical): Not on file  Physical Activity:   . Days of  Exercise per Week: Not on file  . Minutes of Exercise per Session: Not on file  Stress:   . Feeling of Stress : Not on file  Social Connections:   . Frequency of Communication with Friends and Family: Not on file  . Frequency of Social Gatherings with Friends and Family: Not on file  . Attends Religious Services: Not on file  . Active Member of Clubs or Organizations: Not on file  . Attends Archivist Meetings: Not on file  . Marital Status: Not on file  Intimate Partner Violence:   . Fear of Current or Ex-Partner: Not on file  . Emotionally Abused: Not on file  . Physically Abused: Not on file  . Sexually Abused: Not on file    FAMILY HISTORY: Family History  Problem Relation Age of Onset  . Breast cancer Other 42    ALLERGIES:  is allergic to pseudoephedrine hcl.  MEDICATIONS:  Current Outpatient Medications  Medication Sig Dispense Refill  . levocetirizine (XYZAL) 5 MG tablet Take 5 mg by mouth every evening.    Marland Kitchen losartan (COZAAR) 50 MG tablet Take 50 mg by mouth daily.    . montelukast (SINGULAIR) 10 MG tablet Take 10 mg by mouth at bedtime.    Marland Kitchen omeprazole (PRILOSEC) 20 MG capsule Take 20 mg by mouth daily.    . ondansetron (ZOFRAN ODT) 4 MG disintegrating tablet Take 1 tablet (4 mg total) by mouth every 8 (eight) hours as needed for nausea or vomiting. 20 tablet 0  . oxyCODONE-acetaminophen (PERCOCET) 5-325 MG tablet Take 1 tablet by mouth every 6 (six) hours as needed for severe pain. 15 tablet 0  . senna-docusate (SENOKOT-S) 8.6-50 MG tablet Take 1 tablet by mouth 2 (two) times daily. 120 tablet 0   No current facility-administered medications for this visit.      Marland Kitchen  PHYSICAL EXAMINATION: ECOG PERFORMANCE STATUS: 0 - Asymptomatic  Vitals:   05/09/19 1517  BP: (!) 161/86  Pulse: 62  Temp: 98.2 F (36.8 C)   Filed Weights   05/09/19 1517  Weight: 139 lb (63 kg)    Physical Exam  Constitutional: She is oriented to person, place, and time and  well-developed, well-nourished, and in no distress.  HENT:  Head: Normocephalic and atraumatic.  Mouth/Throat: Oropharynx is clear and moist. No oropharyngeal exudate.  Eyes: Pupils are equal, round, and reactive to light.  Cardiovascular: Normal rate and regular rhythm.  Pulmonary/Chest: Effort normal and breath sounds normal. No respiratory distress. She has no wheezes.  Abdominal: Soft. Bowel sounds are normal. She exhibits no distension and no mass. There is no abdominal tenderness. There is no rebound and no guarding.  Musculoskeletal:        General: No tenderness or edema. Normal range of motion.     Cervical back: Normal range of motion and neck supple.  Neurological: She is alert and oriented to person, place, and time.  Skin: Skin is warm.  Psychiatric: Affect normal.     LABORATORY DATA:  I have reviewed the data as listed Lab Results  Component Value Date  WBC 4.7 05/07/2019   HGB 13.4 05/07/2019   HCT 38.3 05/07/2019   MCV 93.0 05/07/2019   PLT 264 05/07/2019   Recent Labs    05/07/19 1625  NA 140  K 3.6  CL 104  CO2 28  GLUCOSE 119*  BUN 15  CREATININE 0.84  CALCIUM 9.3  GFRNONAA >60  GFRAA >60  PROT 8.2*  ALBUMIN 4.6  AST 16  ALT 15  ALKPHOS 64  BILITOT 0.5    RADIOGRAPHIC STUDIES: I have personally reviewed the radiological images as listed and agreed with the findings in the report. CT ABDOMEN PELVIS W CONTRAST  Result Date: 05/07/2019 CLINICAL DATA:  Nausea vomiting and abdominal pain. EXAM: CT ABDOMEN AND PELVIS WITH CONTRAST TECHNIQUE: Multidetector CT imaging of the abdomen and pelvis was performed using the standard protocol following bolus administration of intravenous contrast. CONTRAST:  154mL OMNIPAQUE IOHEXOL 300 MG/ML  SOLN COMPARISON:  05/18/2014 FINDINGS: Lower chest: Atelectasis noted in the lung bases. Hepatobiliary: No suspicious focal abnormality within the liver parenchyma. Gallbladder is surgically absent. No intrahepatic or  extrahepatic biliary dilation. Pancreas: No focal mass lesion. No dilatation of the main duct. No intraparenchymal cyst. No peripancreatic edema. Spleen: No splenomegaly. No focal mass lesion. Adrenals/Urinary Tract: No adrenal nodule or mass. Kidneys unremarkable. No evidence for hydroureter. The urinary bladder appears normal for the degree of distention. Stomach/Bowel: Stomach is unremarkable. No gastric wall thickening. No evidence of outlet obstruction. Duodenum is normally positioned as is the ligament of Treitz. No small bowel wall thickening. No small bowel dilatation. The terminal ileum is normal. Nonvisualization of the appendix is consistent with the reported history of appendectomy. No gross colonic mass. No colonic wall thickening. Diverticular changes are noted in the left colon without evidence of diverticulitis. Vascular/Lymphatic: No abdominal aortic aneurysm. 9 mm short axis right retrocrural lymph node identified. Numerous para-aortic lymph nodes are noted, measuring up to about 9 mm short axis in the left para-aortic space. Insert no pelvic sidewall lymphadenopathy Reproductive: The uterus is unremarkable.  There is no adnexal mass. Other: Abnormal soft tissue is identified in the central small bowel mesentery, encasing the vascular anatomy. 9.3 x 3.0 cm central mesenteric soft tissue mass is identified on image 38/series 2. 3.0 x 3.0 cm collar of soft tissue surrounds branches of the superior mesenteric artery and vein on image 47/2 with relatively little mass-effect on the vascular anatomy. 2.0 x 1.5 cm nodular component of this abnormal soft tissue is identified in the mesentery of the right pelvis on 55/2. Musculoskeletal: No worrisome lytic or sclerotic osseous abnormality. IMPRESSION: 1. 9.3 x 3.0 cm central mesenteric soft tissue mass encasing branches of the superior mesenteric artery and vein. Other relatively bulky areas of abnormal mesenteric soft tissue are associated. Imaging  features are highly suspicious for lymphoma. 2. Numerous upper normal retroperitoneal lymph nodes, also compatible with lymphoma. 3. Left colonic diverticulosis without diverticulitis. Electronically Signed   By: Misty Stanley M.D.   On: 05/07/2019 18:28    ASSESSMENT & PLAN:   Irregularly shaped mass of abdomen #Approximately 9 cm mass abdomen/involving the mesentery; right pelvic mass approximately 19 size-highly concerning for malignancy like lymphoma.  Recent labs in the emergency room within normal limits including normal CBC hemoglobin platelets and also normal renal function.  Recommend a PET scan for further evaluation-and also for biopsy planning.  #Also recommend checking-LDH; beta-2 microglobulin hepatitis; PT PTT work-up.   #Discussed that patient will need biopsy of the mass to confirm the suspicion  of malignancy; and pathology.   #Nausea-question etiology.  Question oxycodone versus others.  Recommend taking Zofran for now.  #Headaches-chronic worsened-no clinical suspicion of malignancy involving the brain at this time.  Monitor closely.  Thank you,  for allowing me to participate in the care of your pleasant patient. Please do not hesitate to contact me with questions or concerns in the interim.  I offered to talk to family-patient declines.   # I reviewed the blood work- with the patient in detail; also reviewed the imaging independently [as summarized above]; and with the patient in detail.   # DISPOSITION: # Labs today-  # PET scan ASAP # follow up TBD- Dr.B  All questions were answered. The patient knows to call the clinic with any problems, questions or concerns.    [name], MD 05/09/2019 4:22 PM

## 2019-05-09 NOTE — Assessment & Plan Note (Addendum)
#  Approximately 9 cm mass abdomen/involving the mesentery; right pelvic mass approximately 19 size-highly concerning for malignancy like lymphoma.  Recent labs in the emergency room within normal limits including normal CBC hemoglobin platelets and also normal renal function.  Recommend a PET scan for further evaluation-and also for biopsy planning.  #Also recommend checking-LDH; beta-2 microglobulin hepatitis; PT PTT work-up.   #Discussed that patient will need biopsy of the mass to confirm the suspicion of malignancy; and pathology.   #Nausea-question etiology.  Question oxycodone versus others.  Recommend taking Zofran for now.  #Headaches-chronic worsened-no clinical suspicion of malignancy involving the brain at this time.  Monitor closely.  Thank you,  for allowing me to participate in the care of your pleasant patient. Please do not hesitate to contact me with questions or concerns in the interim.  I offered to talk to family-patient declines.   # I reviewed the blood work- with the patient in detail; also reviewed the imaging independently [as summarized above]; and with the patient in detail.   # DISPOSITION: # Labs today-  # PET scan ASAP # follow up TBD- Dr.B

## 2019-05-10 LAB — BETA 2 MICROGLOBULIN, SERUM: Beta-2 Microglobulin: 1.4 mg/L (ref 0.6–2.4)

## 2019-05-11 ENCOUNTER — Other Ambulatory Visit: Payer: Self-pay | Admitting: Internal Medicine

## 2019-05-11 ENCOUNTER — Telehealth: Payer: Self-pay | Admitting: Internal Medicine

## 2019-05-11 NOTE — Telephone Encounter (Signed)
x

## 2019-05-18 ENCOUNTER — Encounter
Admission: RE | Admit: 2019-05-18 | Discharge: 2019-05-18 | Disposition: A | Discharge status: Home / Self Care | Payer: Commercial Managed Care - PPO | Source: Ambulatory Visit | Attending: Internal Medicine | Admitting: Internal Medicine

## 2019-05-18 ENCOUNTER — Other Ambulatory Visit: Payer: Self-pay

## 2019-05-18 DIAGNOSIS — R1900 Intra-abdominal and pelvic swelling, mass and lump, unspecified site: Secondary | ICD-10-CM | POA: Insufficient documentation

## 2019-05-18 DIAGNOSIS — Z711 Person with feared health complaint in whom no diagnosis is made: Secondary | ICD-10-CM | POA: Insufficient documentation

## 2019-05-18 LAB — GLUCOSE, CAPILLARY: Glucose-Capillary: 81 mg/dL (ref 70–99)

## 2019-05-18 MED ORDER — FLUDEOXYGLUCOSE F - 18 (FDG) INJECTION
7.2000 | Freq: Once | INTRAVENOUS | Status: AC | PRN
  Administered 2019-05-18: 7.34 via INTRAVENOUS

## 2019-05-19 ENCOUNTER — Other Ambulatory Visit: Payer: Commercial Managed Care - PPO

## 2019-05-19 ENCOUNTER — Telehealth: Payer: Self-pay | Admitting: Internal Medicine

## 2019-05-19 NOTE — [place] Notes (Signed)
Tumor Board Documentation  Kim Contreras was presented by Dr Rogue Bussing at our Tumor Board on 05/19/2019, which included representatives from medical oncology, radiation oncology, navigation, pathology, radiology, surgical, internal medicine, genetics, pulmonology, palliative care, research.  Kim Contreras currently presents as a new patient, for discussion with history of the following treatments: active survellience.  Additionally, we reviewed previous medical and familial history, history of present illness, and recent lab results along with all available histopathologic and imaging studies. The tumor board considered available treatment options and made the following recommendations: Biopsy Refer to Surgery for laproscopic biopsy  The following procedures/referrals were also placed: No orders of the defined types were placed in this encounter.   Clinical Trial Status: not discussed   Staging used: Not Applicable  National site-specific guidelines   were discussed with respect to the case.  Tumor board is a meeting of clinicians from various specialty areas who evaluate and discuss patients for whom a multidisciplinary approach is being considered. Final determinations in the plan of care are those of the provider(s). The responsibility for follow up of recommendations given during tumor board is that of the provider.   Today's extended care, comprehensive team conference, Kim Contreras was not present for the discussion and was not examined.   Multidisciplinary Tumor Board is a multidisciplinary case peer review process.  Decisions discussed in the Multidisciplinary Tumor Board reflect the opinions of the specialists present at the conference without having examined the patient.  Ultimately, treatment and diagnostic decisions rest with the primary provider(s) and the patient.

## 2019-05-19 NOTE — Telephone Encounter (Signed)
On 2/19- I have left VM for pt to call us back to discuss the plan for biopsy. Discussed at TB- needs open Biopsy. Will need referral to surgeon.   H/T- please reach out pt to re: above plan. I will be happy to talk to her further.  GB

## 2019-05-20 ENCOUNTER — Other Ambulatory Visit: Payer: Self-pay

## 2019-05-20 ENCOUNTER — Inpatient Hospital Stay: Payer: Commercial Managed Care - PPO | Admitting: Internal Medicine

## 2019-05-20 DIAGNOSIS — R1900 Intra-abdominal and pelvic swelling, mass and lump, unspecified site: Secondary | ICD-10-CM

## 2019-05-20 DIAGNOSIS — C8238 Follicular lymphoma grade IIIa, lymph nodes of multiple sites: Secondary | ICD-10-CM | POA: Diagnosis not present

## 2019-05-20 NOTE — Progress Notes (Signed)
[place] NOTE  Patient Care Team: Center, [facility] as PCP - General (General Practice)  CHIEF COMPLAINTS/PURPOSE OF CONSULTATION: MASS in abdomen  # 5th FEB 2021- ABDOMINAL MASS-  9.3 x 3.0 cm central mesenteric soft tissue mass is identified on image 38/series 2. 3.0x 3.0 cm collar of soft tissue surrounds branches of the superior mesenteric artery and vein on image 47/2 with relatively little mass-effect on the vascular anatomy. 2.0 x 1.5 cm nodular component of this abnormal soft tissue is identified in the mesentery of the right pelvis on 55/2. FEB 2021- PET-  PET scan-February 2021-SUV around 5.5 again highly suggestive of malignancy lymphoma.  Small cluster left supra pelvic lymph node/small retroperitoneal lymph nodes-5 mm-Douville score 3.   #Chronic headaches   Oncology History   No history exists.     HISTORY OF PRESENTING ILLNESS: Patient speaks minimal English/interpreter virtual present/patient daughter [name] in office Kim Contreras 59 y.o.  female with newly diagnosed mass in the abdomen-on her recent evaluation emergency room/CT scan is here to review the results of the PET scan.  Patient continues to have vague abdominal pains.  Denies any constipation.  Denies any vomiting.  Complains of chronic nausea.  Chronic intermittent headaches.  Not any worse.   Review of Systems  Constitutional: Positive for malaise/fatigue. Negative for chills, diaphoresis, fever and weight loss.  HENT: Negative for nosebleeds and sore throat.   Eyes: Negative for double vision.  Respiratory: Negative for cough, hemoptysis, sputum production, shortness of breath and wheezing.   Cardiovascular: Negative for chest pain, palpitations, orthopnea and leg swelling.  Gastrointestinal: Positive for abdominal pain and nausea. Negative for blood in stool, constipation, diarrhea, heartburn, melena and vomiting.  Genitourinary: Negative for dysuria,  frequency and urgency.  Musculoskeletal: Positive for back pain and joint pain.  Skin: Negative.  Negative for itching and rash.  Neurological: Positive for tingling (in hands x 2 months) and headaches. Negative for dizziness, focal weakness and weakness.  Endo/Heme/Allergies: Does not bruise/bleed easily.  Psychiatric/Behavioral: Negative for depression. The patient is not nervous/anxious and does not have insomnia.      MEDICAL HISTORY:  Past Medical History:  Diagnosis Date  . Arthritis   . Helicobacter pylori gastritis   . History of hiatal hernia   . Hypertension     SURGICAL HISTORY: Past Surgical History:  Procedure Laterality Date  . APPENDECTOMY    . CHOLECYSTECTOMY N/A 01/19/2015   Procedure: LAPAROSCOPIC CHOLECYSTECTOMY;  [name], MD;  [name];  Service: General;  [name];  . COLONOSCOPY WITH PROPOFOL N/A 10/27/2014   Procedure: COLONOSCOPY WITH PROPOFOL;  [name], MD;  Location: [facility] ENDOSCOPY;  Service: Endoscopy;  [name];  . ESOPHAGOGASTRODUODENOSCOPY    . NASAL SINUS SURGERY    . TUBAL LIGATION      SOCIAL HISTORY: Social History   Socioeconomic History  . Marital status: Married    Spouse name: Not on file  . Number of children: Not on file  . Years of education: Not on file  . Highest education level: Not on file  Occupational History  . Not on file  Tobacco Use  . Smoking status: Never Smoker  . Smokeless tobacco: Never Used  Substance and Sexual Activity  . Alcohol use: No  . Drug use: No  . Sexual activity: Not on file  Other Topics Concern  . Not on file  Social History Narrative  . Not on file  Social Determinants of Health   Financial Resource Strain:   . Difficulty of Paying Living Expenses: Not on file  Food Insecurity:   . Worried About Charity fundraiser in the Last Year: Not on file  . Ran Out of Food in the Last Year: Not on file  Transportation Needs:   .  Lack of Transportation (Medical): Not on file  . Lack of Transportation (Non-Medical): Not on file  Physical Activity:   . Days of Exercise per Week: Not on file  . Minutes of Exercise per Session: Not on file  Stress:   . Feeling of Stress : Not on file  Social Connections:   . Frequency of Communication with Friends and Family: Not on file  . Frequency of Social Gatherings with Friends and Family: Not on file  . Attends Religious Services: Not on file  . Active Member of Clubs or Organizations: Not on file  . Attends Archivist Meetings: Not on file  . Marital Status: Not on file  Intimate Partner Violence:   . Fear of Current or Ex-Partner: Not on file  . Emotionally Abused: Not on file  . Physically Abused: Not on file  . Sexually Abused: Not on file    FAMILY HISTORY: Family History  Problem Relation Age of Onset  . Breast cancer Other 42    ALLERGIES:  is allergic to pseudoephedrine hcl.  MEDICATIONS:  Current Outpatient Medications  Medication Sig Dispense Refill  . levocetirizine (XYZAL) 5 MG tablet Take 5 mg by mouth every evening.    Marland Kitchen losartan (COZAAR) 50 MG tablet Take 50 mg by mouth daily.    . montelukast (SINGULAIR) 10 MG tablet Take 10 mg by mouth at bedtime.    Marland Kitchen omeprazole (PRILOSEC) 20 MG capsule Take 20 mg by mouth daily.    Marland Kitchen senna-docusate (SENOKOT-S) 8.6-50 MG tablet Take 1 tablet by mouth 2 (two) times daily. 120 tablet 0  . ondansetron (ZOFRAN ODT) 4 MG disintegrating tablet Take 1 tablet (4 mg total) by mouth every 8 (eight) hours as needed for nausea or vomiting. (Patient not taking: Reported on 05/20/2019) 20 tablet 0  . oxyCODONE-acetaminophen (PERCOCET) 5-325 MG tablet Take 1 tablet by mouth every 6 (six) hours as needed for severe pain. (Patient not taking: Reported on 05/20/2019) 15 tablet 0   No current facility-administered medications for this visit.      Marland Kitchen  PHYSICAL EXAMINATION: ECOG PERFORMANCE STATUS: 0 -  Asymptomatic  Vitals:   05/20/19 1417  BP: (!) 189/99  Pulse: 73  Resp: 20  Temp: (!) 97.5 F (36.4 C)   Filed Weights   05/20/19 1417  Weight: 138 lb (62.6 kg)    Physical Exam  Constitutional: She is oriented to person, place, and time and well-developed, well-nourished, and in no distress.  HENT:  Head: Normocephalic and atraumatic.  Mouth/Throat: Oropharynx is clear and moist. No oropharyngeal exudate.  Eyes: Pupils are equal, round, and reactive to light.  Cardiovascular: Normal rate and regular rhythm.  Pulmonary/Chest: Effort normal and breath sounds normal. No respiratory distress. She has no wheezes.  Abdominal: Soft. Bowel sounds are normal. She exhibits no distension and no mass. There is no abdominal tenderness. There is no rebound and no guarding.  Musculoskeletal:        General: No tenderness or edema. Normal range of motion.     Cervical back: Normal range of motion and neck supple.  Neurological: She is alert and oriented to person, place, and  time.  Skin: Skin is warm.  Psychiatric: Affect normal.     LABORATORY DATA:  I have reviewed the data as listed Lab Results  Component Value Date   WBC 4.7 05/07/2019   HGB 13.4 05/07/2019   HCT 38.3 05/07/2019   MCV 93.0 05/07/2019   PLT 264 05/07/2019   Recent Labs    05/07/19 1625  NA 140  K 3.6  CL 104  CO2 28  GLUCOSE 119*  BUN 15  CREATININE 0.84  CALCIUM 9.3  GFRNONAA >60  GFRAA >60  PROT 8.2*  ALBUMIN 4.6  AST 16  ALT 15  ALKPHOS 64  BILITOT 0.5    RADIOGRAPHIC STUDIES: I have personally reviewed the radiological images as listed and agreed with the findings in the report. CT ABDOMEN PELVIS W CONTRAST  Result Date: 05/07/2019 CLINICAL DATA:  Nausea vomiting and abdominal pain. EXAM: CT ABDOMEN AND PELVIS WITH CONTRAST TECHNIQUE: Multidetector CT imaging of the abdomen and pelvis was performed using the standard protocol following bolus administration of intravenous contrast. CONTRAST:   18mL OMNIPAQUE IOHEXOL 300 MG/ML  SOLN COMPARISON:  05/18/2014 FINDINGS: Lower chest: Atelectasis noted in the lung bases. Hepatobiliary: No suspicious focal abnormality within the liver parenchyma. Gallbladder is surgically absent. No intrahepatic or extrahepatic biliary dilation. Pancreas: No focal mass lesion. No dilatation of the main duct. No intraparenchymal cyst. No peripancreatic edema. Spleen: No splenomegaly. No focal mass lesion. Adrenals/Urinary Tract: No adrenal nodule or mass. Kidneys unremarkable. No evidence for hydroureter. The urinary bladder appears normal for the degree of distention. Stomach/Bowel: Stomach is unremarkable. No gastric wall thickening. No evidence of outlet obstruction. Duodenum is normally positioned as is the ligament of Treitz. No small bowel wall thickening. No small bowel dilatation. The terminal ileum is normal. Nonvisualization of the appendix is consistent with the reported history of appendectomy. No gross colonic mass. No colonic wall thickening. Diverticular changes are noted in the left colon without evidence of diverticulitis. Vascular/Lymphatic: No abdominal aortic aneurysm. 9 mm short axis right retrocrural lymph node identified. Numerous para-aortic lymph nodes are noted, measuring up to about 9 mm short axis in the left para-aortic space. Insert no pelvic sidewall lymphadenopathy Reproductive: The uterus is unremarkable.  There is no adnexal mass. Other: Abnormal soft tissue is identified in the central small bowel mesentery, encasing the vascular anatomy. 9.3 x 3.0 cm central mesenteric soft tissue mass is identified on image 38/series 2. 3.0 x 3.0 cm collar of soft tissue surrounds branches of the superior mesenteric artery and vein on image 47/2 with relatively little mass-effect on the vascular anatomy. 2.0 x 1.5 cm nodular component of this abnormal soft tissue is identified in the mesentery of the right pelvis on 55/2. Musculoskeletal: No worrisome lytic or  sclerotic osseous abnormality. IMPRESSION: 1. 9.3 x 3.0 cm central mesenteric soft tissue mass encasing branches of the superior mesenteric artery and vein. Other relatively bulky areas of abnormal mesenteric soft tissue are associated. Imaging features are highly suspicious for lymphoma. 2. Numerous upper normal retroperitoneal lymph nodes, also compatible with lymphoma. 3. Left colonic diverticulosis without diverticulitis. Electronically Signed   By: Misty Stanley M.D.   On: 05/07/2019 18:28   NM PET Image Initial (PI) Skull Base To Thigh  Result Date: 05/18/2019 CLINICAL DATA:  Initial treatment strategy for mesenteric mass suspicious for lymphoma. EXAM: NUCLEAR MEDICINE PET SKULL BASE TO THIGH TECHNIQUE: 7.3 mCi F-18 FDG was injected intravenously. Full-ring PET imaging was performed from the skull base to thigh after the  radiotracer. CT data was obtained and used for attenuation correction and anatomic localization. Fasting blood glucose: 81 mg/dl COMPARISON:  CT abdomen 05/07/2019 FINDINGS: Mediastinal blood pool activity: SUV max 2.0 Liver activity: SUV max 3.2 NECK: Bilateral palatine tonsillar activity, left-sided maximum SUV 5.7 and right side 4.8. No adenopathy in the neck. Incidental CT findings: none CHEST: Scattered small left supraclavicular and mediastinal lymph nodes present but are not pathologically enlarged in size. The supraclavicular lymph nodes measure up to about 2 0.5 cm in diameter have a maximum SUV of 2.3 (Deauville 3). Incidental CT findings: Punctate calcification in the left upper lobe on image 70/3, likely calcified granuloma. ABDOMEN/PELVIS: The central mesenteric mass tracks around mesenteric vasculature down into the pelvis, has maximum SUV of 5.5 (Deauville 4). The upper portion measures 2.9 by 9.1 cm on image 164/3, in the lower portion measures 3.3 by 2.6 cm on image 180/3. Along the margin of this process there small mildly hypermetabolic mesenteric lymph nodes. Scattered  small retroperitoneal lymph nodes are present and are notable in number although not overtly pathologically enlarged. These mesenteric lymph nodes have maximum SUV up to about 3.2 (Deauville 3). Incidental CT findings: Cholecystectomy. Sigmoid colon diverticulosis. SKELETON: No significant abnormal hypermetabolic activity in this region. Incidental CT findings: none IMPRESSION: 1. 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 There are upper normal sized retroperitoneal lymph nodes with Deauville 3 activity, and small clustered left supraclavicular lymph nodes with lower Deauville 3 activity. 2. Bilateral palatine tonsillar activity is fairly symmetric and probably physiologic. No adenopathy in the neck. 3. Sigmoid colon diverticulosis. Electronically Signed   By: Van Clines M.D.   On: 05/18/2019 15:17    ASSESSMENT & PLAN:   Irregularly shaped mass of abdomen #February 2021 CT scan -approximately 9 cm mass abdomen/involving the mesentery; right pelvic mass approximately 22mm size-highly concerning for malignancy like lymphoma.  PET scan-February 2021-SUV around 5.5 again highly suggestive of malignancy lymphoma.  Small cluster left supra pelvic lymph node/small retroperitoneal lymph nodes-5 mm-Douville score 3.   #Discussed with patient and daughter given absence of any obvious lymph nodes accessible for biopsy-open biopsy is recommended.  Discussed with the tumor conference.  Discussed with Dr. Tollie Pizza.  Referral made.   #Discussed with the patient and daughter that-if this does turn out to be lymphoma the most likely treatment option would be chemotherapy.  Discussed that treatment for lymphomas are usually very effective.  However, further treatment plan/prognosis cannot be given at this time until the biopsy is available.   # DISPOSITION:call pt's daughter [maria] for appts.   # referral to Dr.Byrnett-re: mesenteric mass/biopsy # follow up TBD- Dr.B   # I reviewed the blood work- with the patient in detail; also reviewed the imaging independently [as summarized above]; and with the patient in detail.    All questions were answered. The patient knows to call the clinic with any problems, questions or concerns.    [name], MD 05/23/2019 7:40 AM

## 2019-05-20 NOTE — Assessment & Plan Note (Addendum)
#  February 2021 CT scan -approximately 9 cm mass abdomen/involving the mesentery; right pelvic mass approximately 31mm size-highly concerning for malignancy like lymphoma.  PET scan-February 2021-SUV around 5.5 again highly suggestive of malignancy lymphoma.  Small cluster left supra pelvic lymph node/small retroperitoneal lymph nodes-5 mm-Douville score 3.   #Discussed with patient and daughter given absence of any obvious lymph nodes accessible for biopsy-open biopsy is recommended.  Discussed with the tumor conference.  Discussed with Dr. Tollie Pizza.  Referral made.   #Discussed with the patient and daughter that-if this does turn out to be lymphoma the most likely treatment option would be chemotherapy.  Discussed that treatment for lymphomas are usually very effective.  However, further treatment plan/prognosis cannot be given at this time until the biopsy is available.   # DISPOSITION:call pt's daughter [maria] for appts.  # referral to Dr.Byrnett-re: mesenteric mass/biopsy # follow up TBD- Dr.B   # I reviewed the blood work- with the patient in detail; also reviewed the imaging independently [as summarized above]; and with the patient in detail.

## 2019-05-20 NOTE — Progress Notes (Signed)
utlized Spanish interpreter [place]

## 2019-05-24 ENCOUNTER — Other Ambulatory Visit: Payer: Self-pay | Admitting: General Surgery

## 2019-05-26 ENCOUNTER — Encounter
Admission: RE | Admit: 2019-05-26 | Discharge: 2019-05-26 | Disposition: A | Discharge status: Home / Self Care | Payer: Commercial Managed Care - PPO | Source: Ambulatory Visit | Attending: General Surgery | Admitting: General Surgery

## 2019-05-26 ENCOUNTER — Other Ambulatory Visit: Payer: Self-pay

## 2019-05-26 ENCOUNTER — Telehealth: Payer: Self-pay | Admitting: Internal Medicine

## 2019-05-26 HISTORY — DX: Anxiety disorder, unspecified: F41.9

## 2019-05-26 NOTE — Telephone Encounter (Signed)
C/T- please have pt follow up with me around march 12th; MD; no labs. [pt having Biopsy on 5th]. Please talk to daughter [name] for appts. Also need interpreter.   GB

## 2019-05-26 NOTE — Patient Instructions (Signed)
Your procedure is scheduled on: Friday June 03, 2019 Su procedimiento est programado para: Viernes 5 de Marzo Report to Day Surgery. Presntese a: Kim Contreras  To find out your arrival time please call [phone] between 1PM - 3PM on Thursday June 02, 2019. Para saber su hora de llegada por favor llame al [phone] Kim Contreras la 1PM - 3PM el da: Jueves 4 de Marzo, 2021.   Remember: Instructions that are not followed completely may result in serious medical risk, up to and including death,  or upon the discretion of your surgeon and anesthesiologist your surgery may need to be rescheduled.  Recuerde: Las instrucciones que no se siguen completamente Heritage manager en un riesgo de salud grave, incluyendo hasta  la [place] o a discrecin de su cirujano y Environmental health practitioner, su ciruga se puede posponer.   __X_ 1.Do not eat food after midnight the night before your procedure. No    gum chewing or hard candies. You may drink clear liquids up to 2 hours     before you are scheduled to arrive for your surgery- DO not drink clear     Liquids within 2 hours of the start of your surgery.     Clear Liquids include:    water, apple juice without pulp, clear carbohydrate drink such as    Clearfast of [place]art[place]ade, Black Coffee or Tea (Do not add anything to coffee or tea).      No coma nada despus de la medianoche de la noche anterior a su    procedimiento. No coma chicles ni caramelos duros. Puede tomar    lquidos claros hasta 2 horas antes de su hora programada de llegada al     hospital para su procedimiento. No tome lquidos claros durante el     transcurso de las 2 horas de su llegada programada al hospital para su     procedimiento, ya que esto puede llevar a que su procedimiento se    retrase o tenga que volver a Health and safety inspector.  Los lquidos claros incluyen:          - Agua o jugo de [place] sin pulpa          - Bebidas claras con carbohidratos como ClearFast o Gatorade          - Caf  negro o t claro (sin leche, sin cremas, no agregue nada al caf ni al t)  No tome nada que no est en esta lista.  Los pacientes con diabetes tipo 1 y tipo 2 solo deben Agricultural engineer.  Llame a la clnica de [facility]Care o a la unidad de Same Day Surgery si  tiene alguna pregunta sobre estas instrucciones.              _X__ 2.Do Not Smoke or use e-cigarettes For 24 Hours Prior to Your Surgery.    Do not use any chewable tobacco products for at least 6   hours prior to surgery.    No fume ni use cigarrillos electrnicos durante las 24 horas previas    a su [place].  No use ningn producto de tabaco masticable durante   al menos 6 horas antes de la [place].     __X_ 3. No alcohol for 24 hours before or after surgery.    No tome alcohol durante las 24 horas antes ni despus de la [place].    __x__ 5. Notify your doctor if there is any change in your medical condition (cold,fever, infections).    Informe a  su mdico si hay algn cambio en su condicin mdica  (resfriado, fiebre, infecciones).   Do not wear jewelry, make-up, hairpins, clips or nail polish.  No use joyas, maquillajes, pinzas/ganchos para el cabello ni esmalte de uas.  Do not wear lotions, powders, or perfumes. You may wear deodorant.  No use lociones, polvos o perfumes.  Puede usar desodorante.    Do not shave 48 hours prior to surgery. Men may shave face and neck.  No se afeite 48 horas antes de la [place].  Los hombres pueden [employer] cara  y el cuello.   Do not bring valuables to the hospital.   No lleve objetos [facility] is not responsible for any belongings or valuables.  [place] no se hace responsable de ningn tipo de pertenencias u objetos de Geographical information systems officer.               Contacts, dentures or bridgework may not be worn into surgery.  Los lentes de [place], las dentaduras postizas o puentes no se pueden usar en la [place].     Patients discharged the day of surgery will not be allowed to  drive home. A los pacientes que se les da de alta el mismo da de la ciruga no se les permitir conducir a Holiday representative.   __x__ Take these medicines the morning of surgery with A SIP OF WATER:          [employer] estas medicinas la maana de la ciruga con UN SORBO DE AGUA:   1. omeprazole (PRILOSEC)  __x__ Use CHG Soap as directed          Utilice el jabn de CHG segn lo indicado  __x__ Stop Anti-inflammatories such as aspirin, ibuprofen, aleve, naproxen and or BC powders          Deje de tomar antiinflamatorios como aspirinas, ibuprofen, Aleve, naproxen y polvos de BC powders.   __x__ Stop supplements until after surgery            Deje de tomar suplementos hasta despus de la ciruga.  __x__ Do not start any herbal supplements before your surgery.   No empieze a tomar suplementos de hierbas antes de su cirugia.

## 2019-06-01 ENCOUNTER — Other Ambulatory Visit
Admission: RE | Admit: 2019-06-01 | Discharge: 2019-06-01 | Disposition: A | Discharge status: Home / Self Care | Payer: Commercial Managed Care - PPO | Source: Ambulatory Visit | Attending: General Surgery | Admitting: General Surgery

## 2019-06-01 ENCOUNTER — Other Ambulatory Visit: Payer: Self-pay

## 2019-06-01 DIAGNOSIS — Z01812 Encounter for preprocedural laboratory examination: Secondary | ICD-10-CM | POA: Diagnosis present

## 2019-06-01 DIAGNOSIS — Z20822 Contact with and (suspected) exposure to covid-19: Secondary | ICD-10-CM | POA: Diagnosis not present

## 2019-06-01 LAB — SARS CORONAVIRUS 2 (TAT 6-24 HRS): SARS Coronavirus 2: NEGATIVE

## 2019-06-03 ENCOUNTER — Observation Stay
Admission: RE | Admit: 2019-06-03 | Discharge: 2019-06-04 | Disposition: A | Discharge status: Home / Self Care | Payer: Commercial Managed Care - PPO | Attending: General Surgery | Admitting: General Surgery

## 2019-06-03 ENCOUNTER — Ambulatory Visit: Payer: Commercial Managed Care - PPO | Admitting: Certified Registered"

## 2019-06-03 ENCOUNTER — Encounter
Admission: RE | Disposition: A | Discharge status: Home / Self Care | Payer: Self-pay | Source: Home / Self Care | Attending: General Surgery

## 2019-06-03 ENCOUNTER — Encounter: Payer: Self-pay | Admitting: General Surgery

## 2019-06-03 DIAGNOSIS — M1990 Unspecified osteoarthritis, unspecified site: Secondary | ICD-10-CM | POA: Diagnosis not present

## 2019-06-03 DIAGNOSIS — Z79899 Other long term (current) drug therapy: Secondary | ICD-10-CM | POA: Diagnosis not present

## 2019-06-03 DIAGNOSIS — Z888 Allergy status to other drugs, medicaments and biological substances status: Secondary | ICD-10-CM | POA: Insufficient documentation

## 2019-06-03 DIAGNOSIS — R590 Localized enlarged lymph nodes: Secondary | ICD-10-CM | POA: Diagnosis present

## 2019-06-03 DIAGNOSIS — K449 Diaphragmatic hernia without obstruction or gangrene: Secondary | ICD-10-CM | POA: Insufficient documentation

## 2019-06-03 DIAGNOSIS — C8243 Follicular lymphoma grade IIIb, intra-abdominal lymph nodes: Secondary | ICD-10-CM | POA: Diagnosis not present

## 2019-06-03 DIAGNOSIS — I10 Essential (primary) hypertension: Secondary | ICD-10-CM | POA: Insufficient documentation

## 2019-06-03 DIAGNOSIS — C8590 Non-Hodgkin lymphoma, unspecified, unspecified site: Secondary | ICD-10-CM | POA: Diagnosis present

## 2019-06-03 DIAGNOSIS — K219 Gastro-esophageal reflux disease without esophagitis: Secondary | ICD-10-CM | POA: Insufficient documentation

## 2019-06-03 HISTORY — PX: EXCISION MASS ABDOMINAL: SHX6701

## 2019-06-03 SURGERY — EXCISION, MASS, TORSO
Anesthesia: General

## 2019-06-03 MED ORDER — LACTATED RINGERS IV SOLN: INTRAVENOUS | Status: DC

## 2019-06-03 MED ORDER — MORPHINE SULFATE (PF) 4 MG/ML IV SOLN
INTRAVENOUS | Status: AC
  Administered 2019-06-03: 2 mg via INTRAVENOUS
  Filled 2019-06-03: qty 1

## 2019-06-03 MED ORDER — MIDAZOLAM HCL 2 MG/2ML IJ SOLN
INTRAMUSCULAR | Status: AC
  Filled 2019-06-03: qty 2

## 2019-06-03 MED ORDER — ACETAMINOPHEN 325 MG PO TABS: 650.0000 mg | ORAL_TABLET | ORAL | Status: DC | PRN

## 2019-06-03 MED ORDER — GLYCOPYRROLATE 0.2 MG/ML IJ SOLN
INTRAMUSCULAR | Status: AC
  Filled 2019-06-03: qty 1

## 2019-06-03 MED ORDER — FENTANYL CITRATE (PF) 100 MCG/2ML IJ SOLN
INTRAMUSCULAR | Status: AC
  Administered 2019-06-03: 25 ug via INTRAVENOUS
  Filled 2019-06-03: qty 2

## 2019-06-03 MED ORDER — ONDANSETRON HCL 4 MG/2ML IJ SOLN
INTRAMUSCULAR | Status: AC
  Filled 2019-06-03: qty 2

## 2019-06-03 MED ORDER — MORPHINE SULFATE (PF) 4 MG/ML IV SOLN
2.0000 mg | INTRAVENOUS | Status: DC | PRN
  Administered 2019-06-03 – 2019-06-04 (×2): 2 mg via INTRAVENOUS
  Filled 2019-06-03 (×2): qty 1

## 2019-06-03 MED ORDER — BUPIVACAINE HCL (PF) 0.25 % IJ SOLN
INTRAMUSCULAR | Status: AC
  Filled 2019-06-03: qty 30

## 2019-06-03 MED ORDER — FENTANYL CITRATE (PF) 100 MCG/2ML IJ SOLN
25.0000 ug | INTRAMUSCULAR | Status: AC | PRN
  Administered 2019-06-03 (×6): 25 ug via INTRAVENOUS

## 2019-06-03 MED ORDER — BUPIVACAINE LIPOSOME 1.3 % IJ SUSP
INTRAMUSCULAR | Status: AC
  Filled 2019-06-03: qty 20

## 2019-06-03 MED ORDER — ONDANSETRON HCL 4 MG/2ML IJ SOLN
INTRAMUSCULAR | Status: DC | PRN
  Administered 2019-06-03: 4 mg via INTRAVENOUS

## 2019-06-03 MED ORDER — FENTANYL CITRATE (PF) 100 MCG/2ML IJ SOLN
INTRAMUSCULAR | Status: AC
  Filled 2019-06-03: qty 2

## 2019-06-03 MED ORDER — DEXAMETHASONE SODIUM PHOSPHATE 10 MG/ML IJ SOLN
INTRAMUSCULAR | Status: AC
  Filled 2019-06-03: qty 1

## 2019-06-03 MED ORDER — METOPROLOL TARTRATE 5 MG/5ML IV SOLN
INTRAVENOUS | Status: DC | PRN
  Administered 2019-06-03: 2 mg via INTRAVENOUS

## 2019-06-03 MED ORDER — ACETAMINOPHEN 10 MG/ML IV SOLN
INTRAVENOUS | Status: DC | PRN
  Administered 2019-06-03: 1000 mg via INTRAVENOUS

## 2019-06-03 MED ORDER — ENOXAPARIN SODIUM 40 MG/0.4ML ~~LOC~~ SOLN
40.0000 mg | SUBCUTANEOUS | Status: DC
  Administered 2019-06-04: 40 mg via SUBCUTANEOUS
  Filled 2019-06-03: qty 0.4

## 2019-06-03 MED ORDER — MONTELUKAST SODIUM 10 MG PO TABS
10.0000 mg | ORAL_TABLET | Freq: Every day | ORAL | Status: DC
  Administered 2019-06-03: 10 mg via ORAL
  Filled 2019-06-03: qty 1

## 2019-06-03 MED ORDER — LACTATED RINGERS IV SOLN: INTRAVENOUS | Status: DC | PRN

## 2019-06-03 MED ORDER — PROPOFOL 10 MG/ML IV BOLUS
INTRAVENOUS | Status: AC
  Filled 2019-06-03: qty 20

## 2019-06-03 MED ORDER — FENTANYL CITRATE (PF) 100 MCG/2ML IJ SOLN
INTRAMUSCULAR | Status: DC | PRN
  Administered 2019-06-03: 25 ug via INTRAVENOUS
  Administered 2019-06-03 (×3): 50 ug via INTRAVENOUS
  Administered 2019-06-03: 25 ug via INTRAVENOUS

## 2019-06-03 MED ORDER — CEFAZOLIN SODIUM-DEXTROSE 2-4 GM/100ML-% IV SOLN
2.0000 g | INTRAVENOUS | Status: AC
  Administered 2019-06-03: 2 g via INTRAVENOUS

## 2019-06-03 MED ORDER — BUPIVACAINE LIPOSOME 1.3 % IJ SUSP
INTRAMUSCULAR | Status: DC | PRN
  Administered 2019-06-03: 20 mL

## 2019-06-03 MED ORDER — LORATADINE 10 MG PO TABS: 10.0000 mg | ORAL_TABLET | Freq: Every day | ORAL | Status: DC

## 2019-06-03 MED ORDER — LEVOCETIRIZINE DIHYDROCHLORIDE 5 MG PO TABS: 5.0000 mg | ORAL_TABLET | Freq: Every evening | ORAL | Status: DC

## 2019-06-03 MED ORDER — PHENYLEPHRINE HCL (PRESSORS) 10 MG/ML IV SOLN
INTRAVENOUS | Status: DC | PRN
  Administered 2019-06-03: 100 ug via INTRAVENOUS

## 2019-06-03 MED ORDER — PROMETHAZINE HCL 25 MG/ML IJ SOLN: 12.5000 mg | INTRAMUSCULAR | Status: DC | PRN

## 2019-06-03 MED ORDER — HYDROCODONE-ACETAMINOPHEN 5-325 MG PO TABS
1.0000 | ORAL_TABLET | ORAL | Status: DC | PRN
  Administered 2019-06-03 – 2019-06-04 (×3): 1 via ORAL
  Filled 2019-06-03 (×3): qty 1

## 2019-06-03 MED ORDER — ONDANSETRON HCL 4 MG/2ML IJ SOLN: 4.0000 mg | Freq: Once | INTRAMUSCULAR | Status: DC | PRN

## 2019-06-03 MED ORDER — LIDOCAINE HCL (PF) 2 % IJ SOLN
INTRAMUSCULAR | Status: AC
  Filled 2019-06-03: qty 5

## 2019-06-03 MED ORDER — PROPOFOL 10 MG/ML IV BOLUS
INTRAVENOUS | Status: DC | PRN
  Administered 2019-06-03: 140 mg via INTRAVENOUS
  Administered 2019-06-03: 30 mg via INTRAVENOUS

## 2019-06-03 MED ORDER — METOPROLOL TARTRATE 5 MG/5ML IV SOLN
INTRAVENOUS | Status: AC
  Filled 2019-06-03: qty 5

## 2019-06-03 MED ORDER — BUPIVACAINE HCL 0.25 % IJ SOLN
INTRAMUSCULAR | Status: DC | PRN
  Administered 2019-06-03: 30 mL

## 2019-06-03 MED ORDER — ROCURONIUM BROMIDE 100 MG/10ML IV SOLN
INTRAVENOUS | Status: DC | PRN
  Administered 2019-06-03: 30 mg via INTRAVENOUS
  Administered 2019-06-03: 10 mg via INTRAVENOUS

## 2019-06-03 MED ORDER — ROCURONIUM BROMIDE 50 MG/5ML IV SOLN
INTRAVENOUS | Status: AC
  Filled 2019-06-03: qty 1

## 2019-06-03 MED ORDER — ONDANSETRON 4 MG PO TBDP: 4.0000 mg | ORAL_TABLET | Freq: Three times a day (TID) | ORAL | Status: DC | PRN

## 2019-06-03 MED ORDER — CEFAZOLIN SODIUM-DEXTROSE 2-4 GM/100ML-% IV SOLN
INTRAVENOUS | Status: AC
  Filled 2019-06-03: qty 100

## 2019-06-03 MED ORDER — ONDANSETRON HCL 4 MG/2ML IJ SOLN
INTRAMUSCULAR | Status: AC
  Filled 2019-06-03: qty 4

## 2019-06-03 MED ORDER — LIDOCAINE HCL (CARDIAC) PF 100 MG/5ML IV SOSY
PREFILLED_SYRINGE | INTRAVENOUS | Status: DC | PRN
  Administered 2019-06-03: 100 mg via INTRAVENOUS

## 2019-06-03 MED ORDER — GLYCOPYRROLATE 0.2 MG/ML IJ SOLN
INTRAMUSCULAR | Status: DC | PRN
  Administered 2019-06-03: .2 mg via INTRAVENOUS

## 2019-06-03 MED ORDER — SUGAMMADEX SODIUM 200 MG/2ML IV SOLN
INTRAVENOUS | Status: AC
  Filled 2019-06-03: qty 4

## 2019-06-03 MED ORDER — MIDAZOLAM HCL 2 MG/2ML IJ SOLN
INTRAMUSCULAR | Status: DC | PRN
  Administered 2019-06-03: 2 mg via INTRAVENOUS

## 2019-06-03 MED ORDER — SUGAMMADEX SODIUM 200 MG/2ML IV SOLN
INTRAVENOUS | Status: DC | PRN
  Administered 2019-06-03: 300 mg via INTRAVENOUS

## 2019-06-03 MED ORDER — DEXAMETHASONE SODIUM PHOSPHATE 10 MG/ML IJ SOLN
INTRAMUSCULAR | Status: DC | PRN
  Administered 2019-06-03: 10 mg via INTRAVENOUS

## 2019-06-03 MED ORDER — LOSARTAN POTASSIUM 50 MG PO TABS
50.0000 mg | ORAL_TABLET | Freq: Every day | ORAL | Status: DC
  Administered 2019-06-04: 50 mg via ORAL
  Filled 2019-06-03: qty 1

## 2019-06-03 SURGICAL SUPPLY — 37 items
CANISTER SUCT 1200ML W/VALVE (MISCELLANEOUS) ×3 IMPLANT
CLOSURE WOUND 1/2 X4 (GAUZE/BANDAGES/DRESSINGS) ×1
COVER WAND RF STERILE (DRAPES) ×3 IMPLANT
DRAPE LAPAROTOMY 100X77 ABD (DRAPES) ×3 IMPLANT
DRSG TEGADERM 4X4.75 (GAUZE/BANDAGES/DRESSINGS) ×3 IMPLANT
DRSG TELFA 3X8 NADH (GAUZE/BANDAGES/DRESSINGS) ×3 IMPLANT
ELECT REM PT RETURN 9FT ADLT (ELECTROSURGICAL) ×3
ELECTRODE REM PT RTRN 9FT ADLT (ELECTROSURGICAL) ×1 IMPLANT
GLOVE BIO SURGEON STRL SZ7.5 (GLOVE) ×3 IMPLANT
GLOVE INDICATOR 8.0 STRL GRN (GLOVE) ×3 IMPLANT
GOWN STRL REUS W/ TWL LRG LVL3 (GOWN DISPOSABLE) ×4 IMPLANT
GOWN STRL REUS W/TWL LRG LVL3 (GOWN DISPOSABLE) ×8
HEMOSTAT SURGICEL 2X3 (HEMOSTASIS) ×3 IMPLANT
KIT TURNOVER KIT A (KITS) ×3 IMPLANT
LABEL OR SOLS (LABEL) ×3 IMPLANT
NS IRRIG 1000ML POUR BTL (IV SOLUTION) ×3 IMPLANT
PACK BASIN MAJOR ARMC (MISCELLANEOUS) ×3 IMPLANT
RETRACTOR WOUND ALXS 18CM SML (MISCELLANEOUS) ×1 IMPLANT
RTRCTR WOUND ALEXIS O 18CM SML (MISCELLANEOUS) ×3
SET YANKAUER POOLE SUCT (MISCELLANEOUS) ×3 IMPLANT
SPONGE LAP 18X18 RF (DISPOSABLE) ×3 IMPLANT
STAPLER SKIN PROX 35W (STAPLE) IMPLANT
STRIP CLOSURE SKIN 1/2X4 (GAUZE/BANDAGES/DRESSINGS) ×2 IMPLANT
SUT PDS AB 0 CT1 27 (SUTURE) ×3 IMPLANT
SUT PROLENE 0 CT 1 30 (SUTURE) IMPLANT
SUT SILK 2 0 (SUTURE)
SUT SILK 2-0 30XBRD TIE 12 (SUTURE) IMPLANT
SUT SILK 3-0 (SUTURE) IMPLANT
SUT VIC AB 2-0 BRD 54 (SUTURE) IMPLANT
SUT VIC AB 2-0 CT1 27 (SUTURE) ×2
SUT VIC AB 2-0 CT1 TAPERPNT 27 (SUTURE) ×1 IMPLANT
SUT VIC AB 3-0 54X BRD REEL (SUTURE) IMPLANT
SUT VIC AB 3-0 BRD 54 (SUTURE)
SUT VIC AB 3-0 SH 27 (SUTURE) ×4
SUT VIC AB 3-0 SH 27X BRD (SUTURE) ×2 IMPLANT
SYR BULB IRRIG 60ML STRL (SYRINGE) ×3 IMPLANT
TRAY FOLEY MTR SLVR 16FR STAT (SET/KITS/TRAYS/PACK) IMPLANT

## 2019-06-03 NOTE — Plan of Care (Signed)
  Problem: Education: Goal: Knowledge of [facility] Education information/materials will improve Outcome: Progressing Goal: Emotional status will improve Outcome: Progressing Goal: Mental status will improve Outcome: Progressing Goal: Verbalization of understanding the information provided will improve Outcome: Progressing   Problem: Activity: Goal: Interest or engagement in activities will improve Outcome: Progressing Goal: Sleeping patterns will improve Outcome: Progressing   

## 2019-06-03 NOTE — Anesthesia Procedure Notes (Signed)
Procedure Name: Intubation Performed by: Kelton Pillar, CRNA Pre-anesthesia Checklist: Patient identified, Emergency Drugs available, Suction available and Patient being monitored Patient Re-evaluated:Patient Re-evaluated prior to induction Oxygen Delivery Method: Circle system utilized Preoxygenation: Pre-oxygenation with 100% oxygen Induction Type: IV induction Ventilation: Mask ventilation without difficulty Laryngoscope Size: McGraph and 3 Grade View: Grade I Tube type: Oral Tube size: 6.5 mm Number of attempts: 1 Airway Equipment and Method: Stylet Placement Confirmation: ETT inserted through vocal cords under direct vision,  positive ETCO2,  CO2 detector and breath sounds checked- equal and bilateral Secured at: 20 cm Tube secured with: Tape Dental Injury: Teeth and Oropharynx as per pre-operative assessment

## 2019-06-03 NOTE — Op Note (Signed)
Preoperative diagnosis: Mesenteric lymphadenopathy, suspected lymphoma.  Postoperative diagnosis: Same.  Operative procedure: Exploratory laparotomy, biopsy of mesenteric lymph node.  Operating surgeon: Hervey Ard, MD.  Anesthesia: General endotracheal; Exparel 20 cc, Marcaine 0.25%, plain, 30 cc.  Estimated blood loss: 10 cc.  Clinical note: This 59 year old woman presented to the emergency department with abdominal pain.  CT scan showed marked intra-abdominal lymphadenopathy including the area of the mesentery which surrounded the SMA and around the pancreas which included the inferior pancreaticoduodenal artery.  PET scan showed no peripheral lymph nodes for biopsy.  She is brought to the operating at this time for surgical biopsy.  Operative note: The patient received preoperative Ancef.  She underwent general endotracheal anesthesia without difficulty.  The abdomen was cleansed with ChloraPrep and draped.  An 8 cm incision was made just above the level of the umbilicus where the most prominent lymphadenopathy was evident on CT scan.  The skin was incised sharply and the remaining dissection completed with electrocautery.  The peritoneum was entered.  No free fluid.  No adhesions from her previous cholecystectomy or appendectomy appreciated in the surgical field.  A small Alexis wound protector was placed.  The transverse colon and omentum were packed superiorly and the small bowel packed inferiorly.  The mesenteric mass was identified and making use of cautery a 5 mm wide by 12-15 mm disc of tissue was excised.  Care was taken to avoid the SMA vessel which was in the center of this mass.  Tiny arterial vessels were controlled with 3-0 shallow figure-of-eight sutures.  A piece of Surgicel was placed on the wound.  Pathology reported adequate viable tissue for analysis.  The packs were removed.  The fascia and peritoneum were closed in a single layer with interrupted 0 PDS sutures.  The adipose  layer was closed with a running 2-0 Vicryl suture.  The skin was closed with a running 4-0 Vicryl subcuticular suture.  Benzoin, Steri-Strips, Telfa and Tegaderm dressing applied.  Patient tolerated the procedure well and was taken recovery room in stable condition.

## 2019-06-03 NOTE — H&P (Signed)
Kim Contreras [id number] 01-Sep-1960     HPI:   59 year old woman with abdominal pain found on CT to have marked adenopathy involving the small bowel mesentery. No suitable window for radiologic biopsy.  For open biopsy.   Medications Prior to Admission  Medication Sig Dispense Refill Last Dose  . Ascorbic Acid (VITAMIN C GUMMIE PO) Take by mouth.   06/02/2019  . levocetirizine (XYZAL) 5 MG tablet Take 5 mg by mouth every evening.   06/02/2019  . losartan (COZAAR) 50 MG tablet Take 50 mg by mouth daily.   06/02/2019  . montelukast (SINGULAIR) 10 MG tablet Take 10 mg by mouth at bedtime.   06/02/2019  . omeprazole (PRILOSEC) 20 MG capsule Take 20 mg by mouth daily.   06/03/2019 at Unknown time  . ondansetron (ZOFRAN ODT) 4 MG disintegrating tablet Take 1 tablet (4 mg total) by mouth every 8 (eight) hours as needed for nausea or vomiting. 20 tablet 0 06/02/2019  . oxyCODONE-acetaminophen (PERCOCET) 5-325 MG tablet Take 1 tablet by mouth every 6 (six) hours as needed for severe pain. 15 tablet 0 06/02/2019  . senna-docusate (SENOKOT-S) 8.6-50 MG tablet Take 1 tablet by mouth 2 (two) times daily. 120 tablet 0 06/02/2019  . VITAMIN E BLEND PO Take by mouth.   06/02/2019   No Known Allergies Past Medical History:  Diagnosis Date  . Anemia 2006  . Anxiety   . Arthritis   . Helicobacter pylori gastritis   . History of hiatal hernia   . Hypertension    Past Surgical History:  Procedure Laterality Date  . APPENDECTOMY    . CHOLECYSTECTOMY N/A 01/19/2015   Procedure: LAPAROSCOPIC CHOLECYSTECTOMY;  [name], MD;  [name];  Service: General;  [name];  . COLONOSCOPY WITH PROPOFOL N/A 10/27/2014   Procedure: COLONOSCOPY WITH PROPOFOL;  [name], MD;  Location: [facility] ENDOSCOPY;  Service: Endoscopy;  [name];  . ESOPHAGOGASTRODUODENOSCOPY    . NASAL SINUS SURGERY    . TUBAL LIGATION     Social History   Socioeconomic History  . Marital status:  Married    Spouse name: Not on file  . Number of children: Not on file  . Years of education: Not on file  . Highest education level: Not on file  Occupational History  . Not on file  Tobacco Use  . Smoking status: Never Smoker  . Smokeless tobacco: Never Used  Substance and Sexual Activity  . Alcohol use: No  . Drug use: No  . Sexual activity: Not on file  Other Topics Concern  . Not on file  Social History Narrative  . Not on file   Social Determinants of Health   Financial Resource Strain:   . Difficulty of Paying Living Expenses: Not on file  Food Insecurity:   . Worried About Charity fundraiser in the Last Year: Not on file  . Ran Out of Food in the Last Year: Not on file  Transportation Needs:   . Lack of Transportation (Medical): Not on file  . Lack of Transportation (Non-Medical): Not on file  Physical Activity:   . Days of Exercise per Week: Not on file  . Minutes of Exercise per Session: Not on file  Stress:   . Feeling of Stress : Not on file  Social Connections:   . Frequency of Communication with Friends and Family: Not on file  . Frequency of Social Gatherings with Friends and Family: Not  on file  . Attends Religious Services: Not on file  . Active Member of Clubs or Organizations: Not on file  . Attends Archivist Meetings: Not on file  . Marital Status: Not on file  Intimate Partner Violence:   . Fear of Current or Ex-Partner: Not on file  . Emotionally Abused: Not on file  . Physically Abused: Not on file  . Sexually Abused: Not on file   Social History   Social History Narrative  . Not on file     ROS: Negative.     PE: HEENT: Negative. Lungs: Clear. Cardio: RR.    Assessment/Plan:  Proceed with planned mesenteric node biopsy.   Forest Gleason [facility] 06/03/2019

## 2019-06-03 NOTE — Anesthesia Preprocedure Evaluation (Signed)
Anesthesia Evaluation  Patient identified by MRN, date of birth, ID band Patient awake    Reviewed: Allergy & Precautions, NPO status , Patient's Chart, lab work & pertinent test results  History of Anesthesia Complications Negative for: history of anesthetic complications  Airway Mallampati: II       Dental   Pulmonary neg sleep apnea, neg COPD, Not current smoker,           Cardiovascular hypertension, Pt. on medications (-) Past MI and (-) CHF (-) dysrhythmias (-) Valvular Problems/Murmurs     Neuro/Psych neg Seizures Anxiety    GI/Hepatic Neg liver ROS, hiatal hernia, GERD  Medicated and Controlled,  Endo/Other  neg diabetes  Renal/GU negative Renal ROS     Musculoskeletal   Abdominal   Peds  Hematology  (+) anemia ,   Anesthesia Other Findings   Reproductive/Obstetrics                             Anesthesia Physical Anesthesia Plan  ASA: II  Anesthesia Plan: General   Post-op Pain Management:    Induction: Intravenous  PONV Risk Score and Plan: 3 and Ondansetron, Dexamethasone and Midazolam  Airway Management Planned: Oral ETT  Additional Equipment:   Intra-op Plan:   Post-operative Plan:   Informed Consent: I have reviewed the patients History and Physical, chart, labs and discussed the procedure including the risks, benefits and alternatives for the proposed anesthesia with the patient or authorized representative who has indicated his/her understanding and acceptance.       Plan Discussed with:   Anesthesia Plan Comments:         Anesthesia Quick Evaluation

## 2019-06-03 NOTE — Progress Notes (Signed)
Patient refused to answer all of admissions questions. She just wanted to rest after her surgery. Will assess later if able to complete.

## 2019-06-03 NOTE — Transfer of Care (Signed)
Immediate Anesthesia Transfer of Care Note  Patient: Kim Contreras  Procedure(s) Performed: EXCISION MASS ABDOMINAL (N/A )  Patient Location: PACU  Anesthesia Type:General  Level of Consciousness: awake, drowsy and patient cooperative  Airway & Oxygen Therapy: Patient Spontanous Breathing and Patient connected to face mask oxygen  Post-op Assessment: Report given to RN and Post -op Vital signs reviewed and stable  Post vital signs: Reviewed and stable  Last Vitals:  Vitals Value Taken Time  BP 146/130 06/03/19 1402  Temp    Pulse 62 06/03/19 1404  Resp 17 06/03/19 1404  SpO2 100 % 06/03/19 1404  Vitals shown include unvalidated device data.  Last Pain:  Vitals:   06/03/19 1031  TempSrc: Tympanic         Complications: No apparent anesthesia complications

## 2019-06-04 DIAGNOSIS — C8243 Follicular lymphoma grade IIIb, intra-abdominal lymph nodes: Secondary | ICD-10-CM | POA: Diagnosis not present

## 2019-06-04 MED ORDER — HYDROCODONE-ACETAMINOPHEN 5-325 MG PO TABS: 1.0000 | ORAL_TABLET | ORAL | 0 refills | Status: DC | PRN

## 2019-06-04 NOTE — Progress Notes (Signed)
Mada C [place]  A and O x 4 VSS. Pt tolerating diet well. No complaints of pain or nausea. IV removed intact, prescriptions given. Pt voices understanding of discharge instructions with no further questions. Pt discharged via wheelchair with axillary.   Allergies as of 06/04/2019   No Known Allergies     Medication List    STOP taking these medications   oxyCODONE-acetaminophen 5-325 MG tablet Commonly known as: Percocet     TAKE these medications   HYDROcodone-acetaminophen 5-325 MG tablet Commonly known as: NORCO/VICODIN Take 1-2 tablets by mouth every 4 (four) hours as needed for moderate pain.   levocetirizine 5 MG tablet Commonly known as: XYZAL Take 5 mg by mouth every evening.   losartan 50 MG tablet Commonly known as: COZAAR Take 50 mg by mouth daily.   montelukast 10 MG tablet Commonly known as: SINGULAIR Take 10 mg by mouth at bedtime.   omeprazole 20 MG capsule Commonly known as: PRILOSEC Take 20 mg by mouth daily.   ondansetron 4 MG disintegrating tablet Commonly known as: Zofran ODT Take 1 tablet (4 mg total) by mouth every 8 (eight) hours as needed for nausea or vomiting.   senna-docusate 8.6-50 MG tablet Commonly known as: Senokot-S Take 1 tablet by mouth 2 (two) times daily.   VITAMIN C GUMMIE PO Take by mouth.   VITAMIN E BLEND PO Take by mouth.       Vitals:   06/04/19 0802 06/04/19 0830  BP: 126/72 135/82  Pulse: 63 63  Resp: 20   Temp: 98.7 F (37.1 C)   SpO2: 98%     Latrell Potempa Payton Mccallum

## 2019-06-04 NOTE — Progress Notes (Addendum)
AVSS.  Moderate pain overnight in spite of Exparel. No nausea.  HUNGRY. Lungs: Clear. Cardio: RR. ABD: Minimal distension, soft.  Wound: Scant old drainage.  Discussed d/c instructions with [facility] as Optometrist.  Patient has a f/u appt w/ medical oncology on March 12.    Will make arrangements to see the patient the week of March 15th in the office.   Reviewed d/c instructions with the patient's daughter Liz Beach and confirmed she would read them to her mother.

## 2019-06-06 NOTE — Anesthesia Postprocedure Evaluation (Signed)
Anesthesia Post Note  Patient: Kim Contreras  Procedure(s) Performed: EXCISION MASS ABDOMINAL (N/A )  Patient location during evaluation: PACU Anesthesia Type: General Level of consciousness: awake and alert Pain management: pain level controlled Vital Signs Assessment: post-procedure vital signs reviewed and stable Respiratory status: spontaneous breathing and respiratory function stable Cardiovascular status: stable Anesthetic complications: no     Last Vitals:  Vitals:   06/04/19 0802 06/04/19 0830  BP: 126/72 135/82  Pulse: 63 63  Resp: 20   Temp: 37.1 C   SpO2: 98%     Last Pain:  Vitals:   06/04/19 1228  TempSrc:   PainSc: 9                  Tareq Dwan K

## 2019-06-07 ENCOUNTER — Other Ambulatory Visit: Payer: Self-pay | Admitting: Pathology

## 2019-06-07 ENCOUNTER — Encounter: Payer: Self-pay | Admitting: Internal Medicine

## 2019-06-07 ENCOUNTER — Encounter: Payer: Self-pay | Admitting: General Surgery

## 2019-06-07 LAB — SURGICAL PATHOLOGY

## 2019-06-07 NOTE — Discharge Summary (Signed)
Physician Discharge Summary  Patient ID: NORMAN KOLER MRN: [id number] DOB/AGE: 06-21-1960 59 y.o.  Admit date: 06/03/2019 Discharge date: 06/07/2019  Admission Diagnoses: Retroperitoneal adenopathy  Discharge Diagnoses:  Active Problems:   Lymphoma [facility])   Discharged Condition: good  Hospital Course: The patient had retroperitoneal adenopathy and no peripheral site for biopsy.  She was admitted for mesenteric node excision.  Consults: None  Significant Diagnostic Studies: none.  Treatments: surgery: Laparotomy  Discharge Exam: Blood pressure 135/82, pulse 63, temperature 98.7 F (37.1 C), temperature source Oral, resp. rate 20, last menstrual period 01/18/2005, SpO2 98 %. General appearance: alert and cooperative Resp: clear to auscultation bilaterally Cardio: regular rate and rhythm, S1, S2 normal, no murmur, click, rub or gallop GI: soft, non-tender; bowel sounds normal; no masses,  no organomegaly Incision/Wound:Small amount old blood.   Disposition: Discharge disposition: 01-Home or Self Care       Discharge Instructions    Diet - low sodium heart healthy   Complete by: As directed    Discharge instructions   Complete by: As directed    Diet as tolerated.  You will likely eat smaller portions for the next few days.  Tylenol: If needed for soreness.  Ibuprofen (Home med): Three times a day for the next four days, then as needed for comfort.  Norco (Hydrocodone): If needed for pain.  This medication may constipate and make you sleepy.  Laxative of choice if needed.  OK to shower.   Remove the outer bandage on Monday.  Please have your daughter call the office on Monday, [phone], for a follow up appointment.  Report fevers over 101 or vomiting.  Heating pad to abdomen for comfort.   Increase activity slowly   Complete by: As directed      Allergies as of 06/04/2019   No Known Allergies     Medication List    STOP taking these medications    oxyCODONE-acetaminophen 5-325 MG tablet Commonly known as: Percocet     TAKE these medications   HYDROcodone-acetaminophen 5-325 MG tablet Commonly known as: NORCO/VICODIN Take 1-2 tablets by mouth every 4 (four) hours as needed for moderate pain.   levocetirizine 5 MG tablet Commonly known as: XYZAL Take 5 mg by mouth every evening.   losartan 50 MG tablet Commonly known as: COZAAR Take 50 mg by mouth daily.   montelukast 10 MG tablet Commonly known as: SINGULAIR Take 10 mg by mouth at bedtime.   omeprazole 20 MG capsule Commonly known as: PRILOSEC Take 20 mg by mouth daily.   ondansetron 4 MG disintegrating tablet Commonly known as: Zofran ODT Take 1 tablet (4 mg total) by mouth every 8 (eight) hours as needed for nausea or vomiting.   senna-docusate 8.6-50 MG tablet Commonly known as: Senokot-S Take 1 tablet by mouth 2 (two) times daily.   VITAMIN C GUMMIE PO Take by mouth.   VITAMIN E BLEND PO Take by mouth.        Signed: Forest Gleason Lucylle Foulkes 06/07/2019, 10:41 AM

## 2019-06-10 ENCOUNTER — Encounter: Payer: Self-pay | Admitting: Internal Medicine

## 2019-06-10 ENCOUNTER — Inpatient Hospital Stay: Payer: Commercial Managed Care - PPO | Attending: Internal Medicine | Admitting: Internal Medicine

## 2019-06-10 DIAGNOSIS — Z7189 Other specified counseling: Secondary | ICD-10-CM | POA: Diagnosis not present

## 2019-06-10 DIAGNOSIS — Z9221 Personal history of antineoplastic chemotherapy: Secondary | ICD-10-CM | POA: Insufficient documentation

## 2019-06-10 DIAGNOSIS — C8238 Follicular lymphoma grade IIIa, lymph nodes of multiple sites: Secondary | ICD-10-CM | POA: Insufficient documentation

## 2019-06-10 DIAGNOSIS — Z803 Family history of malignant neoplasm of breast: Secondary | ICD-10-CM | POA: Insufficient documentation

## 2019-06-10 DIAGNOSIS — Z79899 Other long term (current) drug therapy: Secondary | ICD-10-CM | POA: Diagnosis not present

## 2019-06-10 DIAGNOSIS — K5730 Diverticulosis of large intestine without perforation or abscess without bleeding: Secondary | ICD-10-CM | POA: Insufficient documentation

## 2019-06-10 DIAGNOSIS — I10 Essential (primary) hypertension: Secondary | ICD-10-CM | POA: Diagnosis not present

## 2019-06-10 DIAGNOSIS — R519 Headache, unspecified: Secondary | ICD-10-CM | POA: Insufficient documentation

## 2019-06-10 DIAGNOSIS — R5383 Other fatigue: Secondary | ICD-10-CM | POA: Diagnosis not present

## 2019-06-10 DIAGNOSIS — R110 Nausea: Secondary | ICD-10-CM | POA: Diagnosis not present

## 2019-06-10 DIAGNOSIS — R5381 Other malaise: Secondary | ICD-10-CM | POA: Insufficient documentation

## 2019-06-10 DIAGNOSIS — M1990 Unspecified osteoarthritis, unspecified site: Secondary | ICD-10-CM | POA: Diagnosis not present

## 2019-06-10 DIAGNOSIS — K5900 Constipation, unspecified: Secondary | ICD-10-CM | POA: Insufficient documentation

## 2019-06-10 DIAGNOSIS — C8593 Non-Hodgkin lymphoma, unspecified, intra-abdominal lymph nodes: Secondary | ICD-10-CM | POA: Insufficient documentation

## 2019-06-10 NOTE — Patient Instructions (Signed)
#   Take pre-medications as recommended prior to next treatment/chemo infusion. Start 2 days prior to treatement x 4 days;   # Take tylenol 325 mg twice a day; [over-the-counter]  # Take claritin one a day [over-the-counter]  # singular 1 pill once a day [prescription] # Dexamethasone 4 mg twice daily. [Prescription]

## 2019-06-10 NOTE — Assessment & Plan Note (Addendum)
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 discussed regarding bone marrow biopsy for further staging purposes-however the fact that it is not going to change the treatment plan I think is reasonable to hold off at this time.  #Discussed the treatment is systemic therapy with Obi-Benda every 4 weeks x 6 cycles.  Patient should have excellent response to therapy; long-term disease control.  Understands that follicular lymphomas are usually incurable; but they do go into remissions for long periods of time.  Discussed the potential side effects including but not limited to-increasing fatigue, nausea vomiting, diarrhea, hair loss, sores in the mouth, increase risk of infection and also neuropathy.  Also discussed infusion reaction with Gazyva.  Premedications extensively discussed. I discussed regarding long hours of infusion [6 to 8 hours] . First month would be more frequent.   #IV access-discussed with patient pros and cons of Mediport.  Interested.  Discussed with Dr. Tollie Pizza.  # DISPOSITION:call pt's daughter [maria] for appts.  # chemo education # follow up in week of March 22nd- MD; labs- cbc/cmp/ldh; Rudene Re; D-2 benda- Dr.B.    # Take pre-medications as recommended prior to next treatment/chemo infusion. Start 2 days prior to treatement x 4 days;   # Take tylenol 325 mg twice a day; [over-the-counter] # Take claritin one a day [over-the-counter] # singular 1 pill once a day [prescription] # Dexamethasone 4 mg twice daily. [Prescription]

## 2019-06-13 ENCOUNTER — Other Ambulatory Visit: Payer: Self-pay | Admitting: General Surgery

## 2019-06-13 ENCOUNTER — Other Ambulatory Visit: Payer: Self-pay | Admitting: *Deleted

## 2019-06-13 DIAGNOSIS — Z7189 Other specified counseling: Secondary | ICD-10-CM | POA: Insufficient documentation

## 2019-06-13 NOTE — Patient Instructions (Signed)
Bendamustine Injection Qu es este medicamento? La BENDAMUSTINA es un agente quimioteraputico. Se utiliza para tratar la leucemia linfoctica crnica y linfoma no Hodgkin. Este medicamento puede ser utilizado para otros usos; si tiene alguna pregunta consulte con su proveedor de atencin mdica o con su farmacutico. MARCAS COMUNES: BELRAPZO, BENDEKA, Treanda Qu le debo informar a mi profesional de la salud antes de tomar este medicamento? Necesitan saber si usted presenta alguno de los siguientes problemas o situaciones: infeccin (especialmente infecciones virales, como varicela o herpes) enfermedad renal enfermedad heptica una reaccin alrgica o inusual a la bendamustina, al manitol, a otros medicamentos, alimentos, colorantes o conservantes si est embarazada o buscando quedar embarazada si est amamantando a un beb Cmo debo utilizar este medicamento? Este medicamento se administra mediante infusin por va intravenosa. Lo administra un profesional de Technical sales engineer en un hospital o en un entorno clnico. Hable con su pediatra para informarse acerca del uso de este medicamento en nios. Puede requerir atencin especial. Sobredosis: Pngase en contacto inmediatamente con un centro toxicolgico o una sala de urgencia si usted cree que haya tomado demasiado medicamento. ATENCIN: [employer] es solo para usted. No comparta este medicamento con nadie. Qu sucede si me olvido de una dosis? Es importante no olvidar ninguna dosis. Informe a su mdico o a su profesional de la salud si no puede asistir a Photographer. Qu puede interactuar con este medicamento? No tome esta medicina con ninguno de los siguientes medicamentos:  clozapina Esta medicina tambin puede interactuar con los siguientes medicamentos:  atazanavir  cimetidina  ciprofloxacina  enoxacina  fluvoxamina  medicamentos para convulsiones, tales como carbamazepina y  fenobarbital  mexiletina  rifampicina  tacrina  tiabendazol  zileuton Puede ser que esta lista no menciona todas las posibles interacciones. Informe a su profesional de [employer]	[place] de [employer] productos a base de hierbas, medicamentos de [place] o suplementos nutritivos que est tomando. Si usted fuma, consume bebidas alcohlicas o si utiliza drogas ilegales, indqueselo tambin a su profesional de [employer]	[place]. Algunas sustancias pueden interactuar con su medicamento. A qu debo estar atento al usar [employer]? Este medicamento podra hacerle sentir un Nurse, mental health. Esto es normal, ya que la quimioterapia puede afectar tanto a las clulas sanas como a las clulas cancerosas. Si presenta algn efecto secundario, infrmelo. Contine con el tratamiento aun si se siente enfermo, a menos que su mdico le indique que lo suspenda. Usted podra necesitar realizarse [employer] de sangre mientras est usando [place]. Consulte a su mdico o a su proveedor de atencin mdica si tiene fiebre, escalofros o dolor de garganta, o cualquier otro sntoma de resfro o gripe. No se trate usted mismo. Este medicamento reduce la capacidad del cuerpo para combatir infecciones. Trate de no acercarse a personas que estn enfermas. Este medicamento puede causar reacciones graves en la piel. Pueden suceder semanas a meses despus de comenzar a Environmental consultant. Contacte a su proveedor de atencin mdica de inmediato si nota que tiene fiebre o sntomas gripales con una erupcin. La erupcin puede ser roja o Phill Myron, y luego puede convertirse en ampollas o descamacin de la piel. O bien, es posible que observe una erupcin roja con hinchazn en la cara, los labios o los ganglios linfticos en el cuello o debajo de los brazos. Este medicamento podra aumentar el riesgo de moretones o sangrado. Consulte a su mdico o a su proveedor de atencin mdica si observa sangrados inusuales. Consulte a su mdico acerca  de su riesgo de  cncer. Usted puede tener mayor riesgo para ciertos tipos de cncer si [place] este medicamento. No debe quedar embarazada mientras est [employer], o al menos por 6 meses despus de dejar de usarlo. Las mujeres deben informar a su mdico si estn buscando quedar embarazadas o si creen que podran estar embarazadas. Los hombres no deben Social worker a Social research officer, government estn recibiendo [employer] y durante al menos 3 meses despus de dejar de usarlo. Existe la posibilidad de efectos secundarios graves en un beb sin nacer. Para obtener ms informacin, hable con su proveedor de atencin mdica o su farmacutico. No debe amamantar a un beb mientras est usando este medicamento o durante al menos 1 semana despus de dejar de usarlo. Este medicamento puede hacer ms difcil que un hombre embarace a Musician. Usted debe hablar con su mdico o su proveedor de atencin mdica si est preocupado por su fertilidad. Qu efectos secundarios puedo tener al [employer] este medicamento? Efectos secundarios que debe informar a su mdico o a Barrister's clerk de la salud tan pronto como sea posible: Chief of Staff, como erupcin cutnea, comezn/picazn o urticaria, e hinchazn de la cara, los labios o la lengua recuentos sanguneos bajos: este medicamento podra reducir la cantidad de glbulos blancos, glbulos rojos y plaquetas. Su riesgo de infeccin y sangrado podra ser mayor. erupcin, fiebre y ganglios linfticos inflamados enrojecimiento, formacin de ampollas, descamacin o distensin de la piel, incluso dentro de la boca signos de infeccin, tales como fiebre o escalofros, tos, Social research officer, government de garganta, Social research officer, government o dificultad para orinar signos de disminucin en la cantidad de plaquetas o sangrado, tales como moretones, puntos rojos en la piel, heces de color negro y aspecto alquitranado, sangre en la orina signos de disminucin en la cantidad de glbulos rojos, tales como cansancio o  debilidad inusual, Youth worker, aturdimiento signos y sntomas de lesin al rin, tales como dificultad para Garment/textile technologist o cambios en la cantidad de orina signos y sntomas de lesin al hgado, tales como orina amarilla oscura o Forensic psychologist; sensacin general de estar enfermo o sntomas gripales; heces claras; prdida del apetito; nuseas; dolor en la regin abdominal superior derecha; cansancio o debilidad inusual; color amarillento de los ojos o la piel Efectos secundarios que generalmente no requieren atencin mdica (infrmelos a su mdico o a su profesional de la salud si persisten o si son molestos): estreimiento disminucin del apetito diarrea dolor de cabeza llagas en la boca nuseas, vmito cansancio Puede ser que esta lista no menciona todos los posibles efectos secundarios. Comunquese a su mdico por asesoramiento mdico [employer] Usted puede informar los efectos secundarios a la FDA por telfono al 1-800-FDA-1088. Dnde debo guardar mi medicina? Este medicamento se administra en hospitales o clnicas y no necesitar guardarlo en su domicilio. ATENCIN: Este folleto es un resumen. Puede ser que no cubra toda la posible informacin. Si usted tiene preguntas acerca de esta medicina, consulte con su mdico, su farmacutico o su profesional de Technical sales engineer.  2020 Elsevier/Gold Standard (2018-08-16 00:00:00) Obinutuzumab injection Qu es este medicamento? El OBINUTUZUMAB es un anticuerpo monoclonal. Se [place] para tratar la leucemia linfoctica crnica y un tipo de linfoma no Hodgkin, el linfoma folicular. Este medicamento puede ser utilizado para otros usos; si tiene alguna pregunta consulte con su proveedor de atencin mdica o con su farmacutico. MARCAS COMUNES: GAZYVA Qu le debo informar a mi profesional de la salud antes de tomar este medicamento? Necesitan saber si usted presenta alguno de los siguientes problemas o situaciones:  infeccin (especialmente una infeccin por un  virus, tal como el virus de la hepatitis B) enfermedad pulmonar o respiratoria enfermedad cardiaca si toma medicamentos que tratan o previenen cogulos sanguneos una reaccin alrgica o inusual al obinutuzumab, a otros medicamentos, alimentos, colorantes o conservantes si est embarazada o buscando quedar embarazada si est amamantando a un beb Cmo debo utilizar este medicamento? Este medicamento se administra mediante infusin por va intravenosa. Lo administra un profesional de Technical sales engineer en un hospital o en un entorno clnico. Hable con su pediatra para informarse acerca del uso de este medicamento en nios. Puede requerir atencin especial. Sobredosis: Pngase en contacto inmediatamente con un centro toxicolgico o una sala de urgencia si usted cree que haya tomado demasiado medicamento. ATENCIN: [employer] es solo para usted. No comparta este medicamento con nadie. Qu sucede si me olvido de una dosis? Mantenga sus citas para los siguientes dosis como se le haya indicado. Es importante de no olvidarse de su dosis. Comunquese a su mdico o a su profesional de la salud si no puede asistir a Photographer. Qu puede interactuar con este medicamento? vacunas de virus vivos Puede ser que esta lista no menciona todas las posibles interacciones. Informe a su profesional de [employer]	[place] de [employer] productos a base de hierbas, medicamentos de [place] o suplementos nutritivos que est tomando. Si usted fuma, consume bebidas alcohlicas o si utiliza drogas ilegales, indqueselo tambin a su profesional de [employer]	[place]. Algunas sustancias pueden interactuar con su medicamento. A qu debo estar atento al usar [employer]? Informe de inmediato cualquier efecto secundario que observe [employer], por ejemplo cambios en la respiracin, fiebre, escalofros, mareo o aturdimiento. Estos efectos son ms comunes con la primera dosis. Visite a la Teacher, music o a su  profesional de la salud para revisar su evolucin. Deber realizarse anlisis de sangre en forma peridica. Informe cualquier otro efecto secundario. Los efectos secundarios de este medicamento pueden continuar despus de haber terminado Dispensing optician. Contine con el tratamiento aun si se siente enfermo, a menos que su mdico le indique que lo suspenda. Consulte a su mdico o a su profesional de la salud si tiene fiebre, escalofros o dolor de garganta, o cualquier otro sntoma de resfro o gripe. No se trate usted mismo. Este medicamento reduce la capacidad del cuerpo para combatir infecciones. Trate de no acercarse a personas que estn enfermas. Este medicamento podra aumentar el riesgo de moretones o sangrado. Consulte a su mdico o a su profesional de la salud si observa sangrados inusuales. No debe quedar embarazada mientras est usando este medicamento o por 6 meses despus de dejar de usarlo. Las mujeres deben informar a su mdico si estn buscando quedar embarazadas o si creen que podran estar embarazadas. Existe la posibilidad de efectos secundarios graves en un beb sin nacer. Para obtener ms informacin, hable con su profesional de la salud o su farmacutico. No debe amamantar a un beb mientras est tomando este medicamento o por 6 meses despus de dejar de usarlo. Qu efectos secundarios puedo tener al [employer] este medicamento? Efectos secundarios que debe informar a su mdico o a Barrister's clerk de la salud tan pronto como sea posible: Chief of Staff, como erupcin cutnea, picazn o urticarias, e hinchazn de la cara, los labios o la lengua problemas respiratorios cambios en la visin dolor u opresin en el pecho confusin mareos prdida de equilibrio o coordinacin conteos sanguneos bajos: este medicamento podra reducir la cantidad de  glbulos blancos, glbulos rojos y plaquetas. Su riesgo de infeccin y sangrado podra ser mayor. signos de disminucin en la cantidad de plaquetas  o sangrado: moretones, puntos rojos en la piel, heces de color negro y aspecto alquitranado, sangre en la orina signos de infeccin: fiebre o escalofros, tos, dolor de garganta, Social research officer, government o dificultad para Garment/textile technologist signos y sntomas de lesin al hgado, como orina amarilla oscura o [place]; sensacin general de estar enfermo o sntomas gripales; heces claras; prdida de apetito; nuseas; dolor en la regin abdominal superior derecha; cansancio o debilidad inusual; color amarillento de los ojos o la piel dificultad para hablar o comprender problemas para caminar vmito Efectos secundarios que generalmente no requieren atencin mdica (infrmelos a su mdico o a Barrister's clerk de la salud si persisten o si son molestos): estreimiento Social research officer, government en las Armed forces operational officer Puede ser que esta lista no menciona todos los posibles efectos secundarios. Comunquese a su mdico por asesoramiento mdico [employer] Usted puede informar los efectos secundarios a la FDA por telfono al 1-800-FDA-1088. Dnde debo guardar mi medicina? Este medicamento se administra en hospitales o clnicas y no necesitar guardarlo en su domicilio. ATENCIN: Este folleto es un resumen. Puede ser que no cubra toda la posible informacin. Si usted tiene preguntas acerca de esta medicina, consulte con su mdico, su farmacutico o su profesional de Technical sales engineer.  2020 Elsevier/Gold Standard (2018-08-16 00:00:00)

## 2019-06-13 NOTE — Progress Notes (Signed)
[place] NOTE  Patient Care Team: [facility] as PCP - General (General Practice)  CHIEF COMPLAINTS/PURPOSE OF CONSULTATION: Lymphoma   Oncology History Overview Note  # 5th FEB 2021- ABDOMINAL MASS-  9.3 x 3.0 cm central mesenteric soft tissue mass is identified on image 38/series 2. 3.0x 3.0 cm collar of soft tissue surrounds branches of the superior mesenteric artery and vein on image 47/2 with relatively little mass-effect on the vascular anatomy. 2.0 x 1.5 cm nodular component of this abnormal soft tissue is identified in the mesentery of the right pelvis on 55/2. FEB 2021- PET-  PET scan-February 2021-SUV around 5.5 again highly suggestive of malignancy lymphoma.  Small cluster left supra pelvic lymph node/small retroperitoneal lymph nodes-5 mm-Douville score 3.   # MARCH 7564- grade 3A follicular lymphoma [Open biopsy Dr. 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       HISTORY OF PRESENTING ILLNESS: Patient speaks minimal English/interpreter virtual present/patient daughter [name] in office.  Kim Contreras 59 y.o.  female with newly diagnosed mass in the abdomen-is here for follow-up to review the results of the open lymph node biopsy.  Patient's laparoscopic procedure was uneventful.  She has recovered fairly  well.  She continues to have vague abdominal pains.  There is a constipation.  Chronic nausea no vomiting.  Chronic intermittent headaches.  She is quite anxious tearful.   Review of Systems  Constitutional: Positive for malaise/fatigue. Negative for chills, diaphoresis, fever and weight loss.  HENT: Negative for nosebleeds and sore throat.   Eyes: Negative for double vision.  Respiratory: Negative for cough, hemoptysis, sputum production, shortness of breath and wheezing.   Cardiovascular: Negative for chest pain, palpitations, orthopnea and leg swelling.  Gastrointestinal: Positive for abdominal pain and nausea. Negative for blood in stool, constipation, diarrhea, heartburn, melena and vomiting.  Genitourinary: Negative for dysuria, frequency and urgency.  Musculoskeletal: Positive for back pain and joint pain.  Skin: Negative.  Negative for itching and rash.  Neurological: Positive for tingling (in hands x 2 months) and headaches. Negative for dizziness, focal weakness and weakness.  Endo/Heme/Allergies: Does not bruise/bleed easily.  Psychiatric/Behavioral: Negative for depression. The patient is not nervous/anxious and does not have insomnia.      MEDICAL HISTORY:  Past Medical History:  Diagnosis Date  . Anemia 2006  . Anxiety   . Arthritis   . Helicobacter pylori gastritis   . History of hiatal hernia   . Hypertension     SURGICAL HISTORY: Past Surgical History:  Procedure Laterality Date  . APPENDECTOMY    . CHOLECYSTECTOMY N/A 01/19/2015   Procedure: LAPAROSCOPIC CHOLECYSTECTOMY;  [name], MD;  [name];  Service: General;  [name];  . COLONOSCOPY WITH PROPOFOL N/A 10/27/2014   Procedure: COLONOSCOPY WITH PROPOFOL;  [name], MD;  [name];  Service: Endoscopy;  [name];  . ESOPHAGOGASTRODUODENOSCOPY    . EXCISION MASS ABDOMINAL N/A 06/03/2019   Procedure: EXCISION MASS ABDOMINAL;  Surgeon:  Robert Bellow, MD;  [name];  Service: General;  [name];  abdominal node biopsy  . NASAL SINUS SURGERY    . TUBAL LIGATION      SOCIAL HISTORY: Social History   Socioeconomic History  . Marital status: Married    Spouse name: Not on file  . Number of children: Not on file  . Years of education: Not on file  . Highest education level: Not on file  Occupational History  . Not on file  Tobacco Use  . Smoking status: Never Smoker  . Smokeless tobacco: Never Used  Substance and Sexual Activity  . Alcohol use: No  . Drug use: No  . Sexual activity: Not on file  Other Topics Concern  . Not on file  Social History Narrative  . Not on file   Social Determinants of Health   Financial Resource Strain:   . Difficulty of Paying Living Expenses:   Food Insecurity:   . Worried About Charity fundraiser in the Last Year:   . Arboriculturist in the Last Year:   Transportation Needs:   . Film/video editor (Medical):   Marland Kitchen Lack of Transportation (Non-Medical):   Physical Activity:   . Days of Exercise per Week:   . Minutes of Exercise per Session:   Stress:   . Feeling of Stress :   Social Connections:   . Frequency of Communication with Friends and Family:   . Frequency of Social Gatherings with Friends and Family:   . Attends Religious Services:   . Active Member of Clubs or Organizations:   . Attends Archivist Meetings:   Marland Kitchen Marital Status:   Intimate Partner Violence:   . Fear of Current or Ex-Partner:   . Emotionally Abused:   Marland Kitchen Physically Abused:   . Sexually Abused:     FAMILY HISTORY: Family History  Problem Relation Age of Onset  . Breast cancer Other 42    ALLERGIES:  has No Known Allergies.  MEDICATIONS:  Current Outpatient Medications  Medication Sig Dispense Refill  . montelukast (SINGULAIR) 10 MG tablet Take 10 mg by mouth at bedtime.    Marland Kitchen omeprazole (PRILOSEC) 20 MG capsule Take 20 mg by mouth daily.    Marland Kitchen  senna-docusate (SENOKOT-S) 8.6-50 MG tablet Take 1 tablet by mouth 2 (two) times daily. 120 tablet 0  . Ascorbic Acid (VITAMIN C GUMMIE PO) Take by mouth.    Marland Kitchen HYDROcodone-acetaminophen (NORCO/VICODIN) 5-325 MG tablet Take 1-2 tablets by mouth every 4 (four) hours as needed for moderate pain. (Patient not taking: Reported on 06/10/2019) 20 tablet 0  . levocetirizine (XYZAL) 5 MG tablet Take 5 mg by mouth every evening.    Marland Kitchen losartan (COZAAR) 50 MG tablet Take 50 mg by mouth daily.    . ondansetron (ZOFRAN ODT) 4 MG disintegrating tablet Take 1 tablet (4 mg total) by mouth every 8 (eight) hours as needed for nausea or vomiting. (Patient not taking: Reported on 06/10/2019) 20 tablet 0  . VITAMIN E BLEND PO Take by mouth.     No current facility-administered medications for this visit.      Marland Kitchen  PHYSICAL EXAMINATION: ECOG PERFORMANCE STATUS: 0 - Asymptomatic  Vitals:   06/10/19 1008  BP: 131/85  Pulse: 67  Resp: 20  Temp: (!) 97 F (36.1 C)   Filed Weights   06/10/19 1008  Weight: 136 lb 8 oz (61.9 kg)    Physical Exam  Constitutional: She is oriented to person, place, and time and well-developed, well-nourished, and in no distress.  HENT:  Head: Normocephalic and atraumatic.  Mouth/Throat: Oropharynx is clear and moist. No oropharyngeal exudate.  Eyes: Pupils are equal, round, and reactive to light.  Cardiovascular: Normal rate and regular rhythm.  Pulmonary/Chest: Effort normal and breath sounds normal. No respiratory distress. She has no wheezes.  Abdominal: Soft. Bowel sounds are normal. She exhibits no distension and no mass. There is no abdominal tenderness. There is no rebound and no guarding.  Musculoskeletal:        General: No tenderness or edema. Normal range of motion.     Cervical back: Normal range of motion and neck supple.  Neurological: She is alert and oriented to person, place, and time.  Skin: Skin is warm.  Psychiatric: Affect normal.     LABORATORY DATA:   I have reviewed the data as listed Lab Results  Component Value Date   WBC 4.7 05/07/2019   HGB 13.4 05/07/2019   HCT 38.3 05/07/2019   MCV 93.0 05/07/2019   PLT 264 05/07/2019   Recent Labs    05/07/19 1625  NA 140  K 3.6  CL 104  CO2 28  GLUCOSE 119*  BUN 15  CREATININE 0.84  CALCIUM 9.3  GFRNONAA >60  GFRAA >60  PROT 8.2*  ALBUMIN 4.6  AST 16  ALT 15  ALKPHOS 64  BILITOT 0.5    RADIOGRAPHIC STUDIES: I have personally reviewed the radiological images as listed and agreed with the findings in the report. NM PET Image Initial (PI) Skull Base To Thigh  Result Date: 05/18/2019 CLINICAL DATA:  Initial treatment strategy for mesenteric mass suspicious for lymphoma. EXAM: NUCLEAR MEDICINE PET SKULL BASE TO THIGH TECHNIQUE: 7.3 mCi F-18 FDG was injected intravenously. Full-ring PET imaging was performed from the skull base to thigh after the radiotracer. CT data was obtained and used for attenuation correction and anatomic localization. Fasting blood glucose: 81 mg/dl COMPARISON:  CT abdomen 05/07/2019 FINDINGS: Mediastinal blood pool activity: SUV max 2.0 Liver activity: SUV max 3.2 NECK: Bilateral palatine tonsillar activity, left-sided maximum SUV 5.7 and right side 4.8. No adenopathy in the neck. Incidental CT findings: none CHEST: Scattered small left supraclavicular and mediastinal lymph nodes present but are not pathologically enlarged in size. The supraclavicular lymph nodes measure up to about 2 0.5 cm in diameter have a maximum SUV of 2.3 (Deauville 3). Incidental CT findings: Punctate calcification in the left upper lobe on image 70/3, likely calcified granuloma. ABDOMEN/PELVIS: The central mesenteric mass tracks around mesenteric vasculature down into the pelvis, has maximum SUV of 5.5 (Deauville 4). The upper portion measures 2.9 by 9.1 cm on image 164/3, in the lower portion measures 3.3 by 2.6 cm on image 180/3. Along the margin of this process there small mildly  hypermetabolic mesenteric lymph nodes. Scattered small retroperitoneal lymph nodes are present and are notable in number although not overtly pathologically enlarged. These mesenteric lymph nodes have maximum SUV up to about 3.2 (Deauville 3). Incidental CT findings: Cholecystectomy. Sigmoid colon diverticulosis. SKELETON: No significant abnormal hypermetabolic activity in this region. Incidental CT findings: none IMPRESSION: 1. 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 There are upper normal sized retroperitoneal lymph  nodes with Deauville 3 activity, and small clustered left supraclavicular lymph nodes with lower Deauville 3 activity. 2. Bilateral palatine tonsillar activity is fairly symmetric and probably physiologic. No adenopathy in the neck. 3. Sigmoid colon diverticulosis. Electronically Signed   By: Van Clines M.D.   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 discussed regarding bone marrow biopsy for further staging purposes-however the fact that it is not going to change the treatment plan I think is reasonable to hold off at this time.  #Discussed the treatment is systemic therapy with Obi-Benda every 4 weeks x 6 cycles.  Patient should have excellent response to therapy; long-term disease control.  Understands that follicular lymphomas are usually incurable; but they do go into remissions for long periods of time.  Discussed the potential side effects including but not limited to-increasing fatigue, nausea vomiting, diarrhea, hair loss, sores in the mouth, increase risk of infection and also neuropathy.  Also discussed infusion reaction with Gazyva.  Premedications extensively discussed. I discussed regarding long hours of infusion [6 to 8  hours] . First month would be more frequent.   #IV access-discussed with patient pros and cons of Mediport.  Interested.  Discussed with Dr. Tollie Pizza.  # DISPOSITION:call pt's daughter [maria] for appts.  # chemo education # follow up in week of March 22nd- MD; labs- cbc/cmp/ldh; Rudene Re; D-2 benda- Dr.B.    # Take pre-medications as recommended prior to next treatment/chemo infusion. Start 2 days prior to treatement x 4 days;   # Take tylenol 325 mg twice a day; [over-the-counter] # Take claritin one a day [over-the-counter] # singular 1 pill once a day [prescription] # Dexamethasone 4 mg twice daily. [Prescription]  All questions were answered. The patient knows to call the clinic with any problems, questions or concerns.    [name], MD 06/13/2019 8:02 AM

## 2019-06-13 NOTE — Progress Notes (Signed)
Patient with recent diagnosis of lymphoma based on mesenteric node biopsy. Central access requested by her treating oncologist.

## 2019-06-13 NOTE — Progress Notes (Signed)
START ON PATHWAY REGIMEN - Lymphoma and CLL     A cycle is every 28 days:     Bendamustine      Rituximab-xxxx   **Always confirm dose/schedule in your pharmacy ordering system**  Patient Characteristics: Follicular Lymphoma, Grades 1, 2, and 3A, First Line, Stage III / IV, Symptomatic or Bulky Disease Disease Type: Follicular Lymphoma, Grade 1, 2, or 3A Disease Type: Not Applicable Disease Type: Not Applicable [place] Stage: III Line of Therapy: First Line Disease Characteristics: Symptomatic or Bulky Disease Intent of Therapy: Non-Curative / Palliative Intent, Discussed with Patient 

## 2019-06-14 ENCOUNTER — Inpatient Hospital Stay: Payer: Commercial Managed Care - PPO

## 2019-06-14 ENCOUNTER — Other Ambulatory Visit: Payer: Self-pay | Admitting: Licensed Clinical Social Worker

## 2019-06-14 ENCOUNTER — Inpatient Hospital Stay (HOSPITAL_BASED_OUTPATIENT_CLINIC_OR_DEPARTMENT_OTHER): Payer: Commercial Managed Care - PPO | Admitting: Oncology

## 2019-06-14 ENCOUNTER — Telehealth: Payer: Self-pay | Admitting: *Deleted

## 2019-06-14 DIAGNOSIS — C8238 Follicular lymphoma grade IIIa, lymph nodes of multiple sites: Secondary | ICD-10-CM | POA: Diagnosis not present

## 2019-06-14 MED ORDER — LIDOCAINE-PRILOCAINE 2.5-2.5 % EX CREA: 1.0000 "application " | TOPICAL_CREAM | CUTANEOUS | 3 refills | Status: DC | PRN

## 2019-06-14 MED ORDER — DEXAMETHASONE 4 MG PO TABS: 4.0000 mg | ORAL_TABLET | Freq: Two times a day (BID) | ORAL | 1 refills | Status: DC

## 2019-06-14 MED ORDER — MONTELUKAST SODIUM 10 MG PO TABS: 10.0000 mg | ORAL_TABLET | Freq: Every day | ORAL | 3 refills | Status: DC

## 2019-06-14 NOTE — Telephone Encounter (Signed)
Tamika spoke with patient's daughter. Patient did not receive the singular and dex for premeds. Spoke with Dr. Rogue Bussing orders for script obtained. Tamika reviewed the instructions with the pt's daughter.  Script for decadron 4 mg 1 tablet by mouth twice daily- starting 2 days prior to the infusion. Take for only 2 days.  Singulair RF sent to pharmacy. Patient takes this normally every day in her routine medications. Dr. Rogue Bussing would like patient to continue taking her daily singulair.  She needs EMLA cream script sent her pharmacy. V/o to send script sent for emla cream to patient's pharmacy. Pt to apply emla cream to port site on days of her treatment. Daughter made aware.  Daughter instructed. To have patient take tylenol 325 twice daily and claritin 10 mgs daily starting 2 days prior to the infusion. Pt will take these premeds for a total of 4 days.  Daughter Read back instructions above.

## 2019-06-16 ENCOUNTER — Encounter
Admission: RE | Admit: 2019-06-16 | Discharge: 2019-06-16 | Disposition: A | Discharge status: Home / Self Care | Payer: Commercial Managed Care - PPO | Source: Ambulatory Visit | Attending: General Surgery | Admitting: General Surgery

## 2019-06-16 ENCOUNTER — Other Ambulatory Visit: Payer: Commercial Managed Care - PPO

## 2019-06-16 ENCOUNTER — Other Ambulatory Visit: Payer: Self-pay

## 2019-06-16 NOTE — Progress Notes (Signed)
Tumor Board Documentation  Kim Contreras was presented by Dr Rogue Bussing at our Tumor Board on 06/16/2019, which included representatives from medical oncology, radiation oncology, navigation, pathology, radiology, surgical, surgical oncology, internal medicine, pharmacy, genetics, pulmonology, palliative care, research.  Kim Contreras currently presents as a current patient, for [place], for new positive pathology with history of the following treatments: active survellience, immunotherapy, surgical intervention(s), neoadjuvant chemotherapy.  Additionally, we reviewed previous medical and familial history, history of present illness, and recent lab results along with all available histopathologic and imaging studies. The tumor board considered available treatment options and made the following recommendations: Chemotherapy    The following procedures/referrals were also placed: No orders of the defined types were placed in this encounter.   Clinical Trial Status: not discussed   Staging used: AJCC Stage Group  AJCC Staging:       Group: Stage 2 - 3 Follicular Lymphoma Grade 3A   National site-specific guidelines NCCN were discussed with respect to the case.  Tumor board is a meeting of clinicians from various specialty areas who evaluate and discuss patients for whom a multidisciplinary approach is being considered. Final determinations in the plan of care are those of the provider(s). The responsibility for follow up of recommendations given during tumor board is that of the provider.   Today's extended care, comprehensive team conference, Kim Contreras was not present for the discussion and was not examined.   Multidisciplinary Tumor Board is a multidisciplinary case peer review process.  Decisions discussed in the Multidisciplinary Tumor Board reflect the opinions of the specialists present at the conference without having examined the patient.  Ultimately, treatment and diagnostic decisions rest with  the primary provider(s) and the patient.

## 2019-06-16 NOTE — Patient Instructions (Signed)
Your procedure is scheduled on: Monday June 20, 2019 Su procedimiento est programado para: Lunes 22 de Marzo del 2021 Report to Day Surgery. Presntese a: Kim Contreras  To find out your arrival time please call [phone] between 1PM - 3PM on Friday June 17, 2019. Para saber su hora de llegada por favor llame al [phone] Kim Contreras la 1PM - 3PM el da: [place] 2021.   Remember: Instructions that are not followed completely may result in serious medical risk, up to and including death,  or upon the discretion of your surgeon and anesthesiologist your surgery may need to be rescheduled.  Recuerde: Las instrucciones que no se siguen completamente Heritage manager en un riesgo de salud grave, incluyendo hasta  la [place] o a discrecin de su cirujano y Environmental health practitioner, su ciruga se puede posponer.   __X_ 1.Do not eat food after midnight the night before your procedure. No    gum chewing or hard candies. You may drink clear liquids up to 2 hours     before you are scheduled to arrive for your surgery- DO not drink clear     Liquids within 2 hours of the start of your surgery.     Clear Liquids include:    water, apple juice without pulp, clear carbohydrate drink such as    Clearfast of [place], Black Coffee or Tea (Do not add anything to coffee or tea).      No coma nada despus de la medianoche de la noche anterior a su    procedimiento. No coma chicles ni caramelos duros. Puede tomar    lquidos claros hasta 2 horas antes de su hora programada de llegada al     hospital para su procedimiento. No tome lquidos claros durante el     transcurso de las 2 horas de su llegada programada al hospital para su     procedimiento, ya que esto puede llevar a que su procedimiento se    retrase o tenga que volver a Health and safety inspector.  Los lquidos claros incluyen:          - Agua o jugo de [place] sin pulpa          - Bebidas claras con carbohidratos como ClearFast o Gatorade           - Caf negro o t claro (sin leche, sin cremas, no agregue nada al caf ni al t)  No tome nada que no est en esta lista.  Los pacientes con diabetes tipo 1 y tipo 2 solo deben Agricultural engineer.  Llame a la clnica de [facility] o a la unidad de Same Day Surgery si  tiene alguna pregunta sobre estas instrucciones.              _X__ 2.Do Not Smoke or use e-cigarettes For 24 Hours Prior to Your Surgery.    Do not use any chewable tobacco products for at least 6   hours prior to surgery.    No fume ni use cigarrillos electrnicos durante las 24 horas previas    a su [place].  No use ningn producto de tabaco masticable durante   al menos 6 horas antes de la [place].     __X_ 3. No alcohol for 24 hours before or after surgery.    No tome alcohol durante las 24 horas antes ni despus de la [place].   __x__ 5. Notify your doctor if there is any change in your medical condition (cold,fever, infections).  Informe a su mdico si hay algn cambio en su condicin mdica  (resfriado, fiebre, infecciones).   Do not wear jewelry, make-up, hairpins, clips or nail polish.  No use joyas, maquillajes, pinzas/ganchos para el cabello ni esmalte de uas.  Do not wear lotions, powders, or perfumes. You may wear deodorant.  No use lociones, polvos o perfumes.  Puede usar desodorante.    Do not shave 48 hours prior to surgery. Men may shave face and neck.  No se afeite 48 horas antes de la [place].  Los hombres pueden [employer] cara  y el cuello.   Do not bring valuables to the hospital.   No lleve objetos [place] is not responsible for any belongings or valuables.  [place] no se hace responsable de ningn tipo de pertenencias u objetos de Geographical information systems officer.               Contacts, dentures or bridgework may not be worn into surgery.  Los lentes de Comeri­o, las dentaduras postizas o puentes no se pueden usar en la [place].   Leave your suitcase in the car. After surgery it may be  brought to your room.  Deje su maleta en el auto.  Despus de la ciruga podr traerla a su habitacin.   For patients admitted to the hospital, discharge time is determined by your  treatment team.  Para los pacientes que sean ingresados al hospital, el tiempo en el cual se le  dar de alta es determinado por su equipo de [place].   Patients discharged the day of surgery will not be allowed to drive home. A los pacientes que se les da de alta el mismo da de la ciruga no se les permitir conducir a Holiday representative.     __x__ Take these medicines the morning of surgery with A SIP OF WATER:          [employer] estas medicinas la maana de la ciruga con UN SORBO DE AGUA:  1. Tylenol (si lo necesita para el dolor)  2. pantoprazole (PROTONIX) 20 MG     __x__ Use CHG Soap as directed          Utilice el jabn de CHG segn lo indicado   __x__ Stop Anti-inflammatories such as ibuprofen, aspirin, Aleve, naproxen and or BC powders.          Deje de tomar antiinflamatorios como ibuprofin, aspirinas, Aleve, naproxen, y polvos de BC powders.   __x__ Stop supplements until after surgery            Deje de tomar suplementos hasta despus de la ciruga  __x__ Do not start any herbal supplements before your surgery.  No empize suplementos con hierbas antes de la [place].

## 2019-06-17 ENCOUNTER — Ambulatory Visit: Payer: Commercial Managed Care - PPO

## 2019-06-17 ENCOUNTER — Other Ambulatory Visit
Admission: RE | Admit: 2019-06-17 | Discharge: 2019-06-17 | Disposition: A | Discharge status: Home / Self Care | Payer: Commercial Managed Care - PPO | Source: Ambulatory Visit | Attending: General Surgery | Admitting: General Surgery

## 2019-06-17 DIAGNOSIS — U071 COVID-19: Secondary | ICD-10-CM | POA: Insufficient documentation

## 2019-06-17 DIAGNOSIS — Z01812 Encounter for preprocedural laboratory examination: Secondary | ICD-10-CM | POA: Insufficient documentation

## 2019-06-17 LAB — SARS CORONAVIRUS 2 (TAT 6-24 HRS): SARS Coronavirus 2: POSITIVE — AB

## 2019-06-17 NOTE — Progress Notes (Signed)
[place]  [phone] [phone]  Patient Care Team: Center, [facility] as PCP - General (General Practice)   Name of the patient: Kim Contreras  RL:[id number]  1960/11/01   Date of visit: 123XX123  Diagnosis-follicular lymphoma  Chief complaint/Reason for visit- Initial Meeting for [facility], preparing for starting chemotherapy  I connected with Wendee Copp on 06/17/19 at  9:30 AM EDT by telephone visit and verified that I am speaking with the correct person using two identifiers.   I discussed the limitations, risks, security and privacy concerns of performing an evaluation and management service by telemedicine and the availability of in-person appointments. I also discussed with the patient that there may be a patient responsible charge related to this service. The patient expressed understanding and agreed to proceed.   Other persons participating in the visit and their role in the encounter: Daughter [name]  Patient's location: Home Provider's location: Office  Heme/Onc history:  Oncology History Overview Note  # 5th FEB 2021- ABDOMINAL MASS-  9.3 x 3.0 cm central mesenteric soft tissue mass is identified on image 38/series 2. 3.0x 3.0 cm collar of soft tissue surrounds branches of the superior mesenteric artery and vein on image 47/2 with relatively little mass-effect on the vascular anatomy. 2.0 x 1.5 cm nodular component of this abnormal soft tissue is identified in the mesentery of the right pelvis on 55/2. FEB 2021- PET-  PET scan-February 2021-SUV around 5.5 again highly suggestive of malignancy lymphoma.  Small cluster left supra pelvic lymph node/small retroperitoneal lymph nodes-5 mm-Douville score 3.   # MARCH 123XX123- grade 3A follicular lymphoma [Open biopsy Dr. 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      Interval history-Kim Contreras is a 59 year old female who presents to chemo care clinic today for initial meeting in preparation for starting chemotherapy. I introduced the chemo care clinic and we discussed that the role of the clinic is to assist those who are at an increased risk of emergency room visits and/or complications during the course of chemotherapy treatment. We discussed that the increased risk takes into account factors such as age, performance status, and co-morbidities. We also discussed that for some, this might include barriers to care such as not having a primary care provider, lack of insurance/transportation, or not being able to afford medications. We discussed that the goal of the program is to help prevent unplanned ER visits and help reduce complications during chemotherapy. We do this by discussing specific risk factors to each individual and identifying ways that we can help improve these risk factors and reduce barriers to care.   ECOG FS:0 - Asymptomatic  Review of systems- Review of Systems  Constitutional: Positive for malaise/fatigue. Negative for chills, fever and weight loss.  HENT: Negative for congestion, ear  pain and tinnitus.   Eyes: Negative.  Negative for blurred vision and double vision.  Respiratory: Negative.  Negative for cough, sputum production and shortness of breath.   Cardiovascular:  Negative.  Negative for chest pain, palpitations and leg swelling.  Gastrointestinal: Positive for abdominal pain and nausea. Negative for constipation, diarrhea and vomiting.  Genitourinary: Negative for dysuria, frequency and urgency.  Musculoskeletal: Negative for back pain and falls.  Skin: Negative.  Negative for rash.  Neurological: Positive for weakness. Negative for headaches.  Endo/Heme/Allergies: Negative.  Does not bruise/bleed easily.  Psychiatric/Behavioral: Negative.  Negative for depression. The patient is not nervous/anxious and does not have insomnia.      Current treatment- obi-benda every 4 weeks x 6 cycles  No Known Allergies  Past Medical History:  Diagnosis Date  . Anemia 2006  . Anxiety   . Arthritis   . Helicobacter pylori gastritis   . History of hiatal hernia   . Hypertension     Past Surgical History:  Procedure Laterality Date  . APPENDECTOMY    . CHOLECYSTECTOMY N/A 01/19/2015   Procedure: LAPAROSCOPIC CHOLECYSTECTOMY;  [name], MD;  [name];  Service: General;  [name];  . COLONOSCOPY WITH PROPOFOL N/A 10/27/2014   Procedure: COLONOSCOPY WITH PROPOFOL;  [name], MD;  Location[facility] END[facility]COPY;  Service: Endoscopy;  [name];  . ESOPHAGOGASTRODUODENOSCOPY    . EXCISION MASS ABDOMINAL N/A 06/03/2019   Procedure: EXCISION MASS ABDOMINAL;  [name], MD;  [name];  Service: General;  [name];  abdominal node biopsy  . NASAL SINUS SURGERY    . TUBAL LIGATION      Social History   Socioeconomic History  . Marital status: Married    Spouse name: Not on file  . Number of children: Not on file  . Years of education: Not on file  . Highest education level: Not on file  Occupational History  . Not on file  Tobacco Use  . Smoking status: Never Smoker  . Smokeless tobacco: Never Used  Substance and Sexual Activity  . Alcohol use: No  . Drug use: No    . Sexual activity: Not on file  Other Topics Concern  . Not on file  Social History Narrative  . Not on file   Social Determinants of Health   Financial Resource Strain:   . Difficulty of Paying Living Expenses:   Food Insecurity:   . Worried About Charity fundraiser in the Last Year:   . Arboriculturist in the Last Year:   Transportation Needs:   . Film/video editor (Medical):   Marland Kitchen Lack of Transportation (Non-Medical):   Physical Activity:   . Days of Exercise per Week:   . Minutes of Exercise per Session:   Stress:   . Feeling of Stress :   Social Connections:   . Frequency of Communication with Friends and Family:   . Frequency of Social Gatherings with Friends and Family:   . Attends Religious Services:   . Active Member of Clubs or Organizations:   . Attends Archivist Meetings:   Marland Kitchen Marital Status:   Intimate Partner Violence:   . Fear of Current or Ex-Partner:   . Emotionally Abused:   Marland Kitchen Physically Abused:   . Sexually Abused:     Family History  Problem Relation Age of Onset  . Breast cancer Other 42     Current Outpatient Medications:  .  dexamethasone (DECADRON) 4 MG tablet, Take 1 tablet (4 mg total) by mouth 2 (two) times daily. Start taking 2 days prior to each infusion for a total for 2 days., Disp: 30 tablet, Rfl: 1 .  HYDROcodone-acetaminophen (NORCO/VICODIN) 5-325 MG tablet, Take 1-2 tablets by mouth every 4 (four) hours as needed for moderate pain. (Patient not taking: Reported on 06/10/2019), Disp: 20 tablet, Rfl: 0 .  levocetirizine (XYZAL) 5 MG tablet, Take 5 mg by mouth every evening., Disp: , Rfl:  .  lidocaine-prilocaine (EMLA) cream, Apply 1 application topically as needed., Disp: 30 g, Rfl: 3 .  losartan (COZAAR) 50 MG tablet, Take 50 mg by mouth daily., Disp: , Rfl:  .  montelukast (SINGULAIR) 10 MG tablet, Take 1 tablet (10 mg total) by mouth at bedtime., Disp: 30 tablet, Rfl: 3 .  ondansetron (ZOFRAN ODT) 4 MG disintegrating  tablet, Take 1 tablet (4 mg total) by mouth every 8 (eight) hours as needed for nausea or vomiting. (Patient not taking: Reported on 06/16/2019), Disp: 20 tablet, Rfl: 0 .  pantoprazole (PROTONIX) 20 MG tablet, Take 20 mg by mouth daily., Disp: , Rfl:  .  senna-docusate (SENOKOT-S) 8.6-50 MG tablet, Take 1 tablet by mouth 2 (two) times daily., Disp: 120 tablet, Rfl: 0  Physical exam: There were no vitals filed for this visit. Limited due to telephone visit  CMP Latest Ref Rng & Units 05/07/2019  Glucose 70 - 99 mg/dL 119(H)  BUN 6 - 20 mg/dL 15  Creatinine 0.44 - 1.00 mg/dL 0.84  Sodium 135 - 145 mmol/L 140  Potassium 3.5 - 5.1 mmol/L 3.6  Chloride 98 - 111 mmol/L 104  CO2 22 - 32 mmol/L 28  Calcium 8.9 - 10.3 mg/dL 9.3  Total Protein 6.5 - 8.1 g/dL 8.2(H)  Total Bilirubin 0.3 - 1.2 mg/dL 0.5  Alkaline Phos 38 - 126 U/L 64  AST 15 - 41 U/L 16  ALT 0 - 44 U/L 15   CBC Latest Ref Rng & Units 05/07/2019  WBC 4.0 - 10.5 K/uL 4.7  Hemoglobin 12.0 - 15.0 g/dL 13.4  Hematocrit 36.0 - 46.0 % 38.3  Platelets 150 - 400 K/uL 264    No images are attached to the encounter.  No results found.   Assessment and plan- Patient is a 59 y.o. female who presents to [facility] for initial meeting in preparation for starting chemotherapy for the treatment of follicular lymphoma.   1. HPI: Patient presented with abdominal mass in February 2021 after having significant abdominal pain and nausea.  She was evaluated in the emergency room.  She was also found to have significant weight loss and drenching night sweats.  Work-up included CT abdomen which showed a 9 cm mass involving the mesentery and right pelvic mass approximately 19 cm in size highly concerning for malignancy like lymphoma.  PET scan revealed Douville 4 disease with a maximum SUV of 5.5 compatible with lymphoma. She had open biopsy of mesenteric node for tissue diagnosis by Dr. Tollie Pizza on 06/03/2019 and a port placed on 06/07/2019.  Biopsy  results revealed grade 3A follicular lymphoma.  Plan is for her to begin Chad next week.  2. Chemo Care Clinic/High Risk for ER/Hospitalization during chemotherapy- We discussed the role of the chemo care clinic and identified patient specific risk factors. I discussed that patient was identified as high risk primarily based on: Stage of disease and comorbidities  Patient has past medical history positive for: Past Medical History:  Diagnosis Date  . Anemia 2006  . Anxiety   . Arthritis   . Helicobacter pylori gastritis   . History of hiatal hernia   . Hypertension     Patient has past surgical history positive for: Past Surgical History:  Procedure Laterality Date  . APPENDECTOMY    . CHOLECYSTECTOMY N/A 01/19/2015   Procedure: LAPAROSCOPIC CHOLECYSTECTOMY;  [name], MD;  [name];  Service: General;  [name];  . COLONOSCOPY WITH PROPOFOL N/A 10/27/2014   Procedure: COLONOSCOPY WITH PROPOFOL;  [name], MD;  Location[facility] ENDOSCOPY;  Service: Endoscopy;  [name];  . ESOPHAGOGASTRODUODENOSCOPY    . EXCISION MASS ABDOMINAL N/A 06/03/2019   Procedure: EXCISION MASS ABDOMINAL;  [name], MD;  [name];  Service: General;  [name];  abdominal node biopsy  . NASAL SINUS SURGERY    . TUBAL LIGATION      Based on our high risk symptom management report; this patient has a high risk of ED utilization.  The percentage below indicates how "at risk "  this patient based on the factors in this table within one year.   General Risk Score: 5  Values used to calculate this score:   Points  Metrics      0        Age: 24      2        Hospital Admissions: 2      3        ED Visits: 4      0        Has Chronic Obstructive Pulmonary Disease: No      0        Has Diabetes: No      0        Has Congestive Heart Failure: No      0        Has liver disease: No      0        Has  Depression: No      0        Current PCP: [facility]      0        Has Medicaid: No  3. We discussed that social determinants of health may have significant impacts on health and outcomes for cancer patients.  Today we discussed specific social determinants of performance status, alcohol use, depression, financial needs, food insecurity, housing, interpersonal violence, social connections, stress, tobacco use, and transportation.    After lengthy discussion the following were identified as areas of need: Spoke with patient's daughter [name] who thinks her mother may need some financial support once she begins receiving hospital bills.  She would like to be in touch with Barnabas Lister crater when he is available.  Email sent.  Outpatient services: We discussed options including home based and outpatient services, DME and care program. We discusssed that patients who participate in regular physical activity report fewer negative impacts of cancer and treatments and report less fatigue.   Financial Concerns: We discussed that living with cancer can create tremendous financial burden.  We discussed options for assistance. I asked that if assistance is needed in affording medications or paying bills to please let us know so that we can provide assistance. We discussed options for food including social services, [employer] garden [employer] ($50 every 2 weeks) and onsite food pantry.  We will also notify Elease Etienne to see if  cancer center can provide additional support.  Referral to Social work: Introduced Education officer, museum Elease Etienne and the services he can provide such as support with [employer], cell phone and gas vouchers.   Support groups: We discussed options for support groups at the cancer center. If interested, please notify nurse navigator to enroll. We discussed options for managing stress including healthy eating, exercise as well as participating in no charge counseling services at the  cancer center and support groups.  If these are of interest, patient can notify either myself or primary nursing team.We discussed options for management including medications and referral to quit Smart program  Transportation: We discussed options for transportation including acta, paratransit, bus routes, link transit, taxi/uber/lyft, and cancer center [place].  I have notified primary oncology team who will help assist with arranging Lucianne Lei transportation for appointments when/if needed. We also discussed options for transportation on short notice/acute visits.  Palliative care services: We have palliative care services available in the cancer center to discuss goals of care and advanced care planning.  Please let us know if you have any questions or would like to speak to our palliative nurse practitioner.  Symptom Management Clinic: We discussed our symptom management clinic which is available for acute concerns while receiving treatment such as nausea, vomiting or diarrhea.  We can be reached via telephone at [phone] or through my chart.  We are available for virtual or in person visits on the same day from 830 to 4 PM Monday through Friday. She denies needing specific assistance at this time and She will be followed by Dr. Aletha Halim  clinical team.  Plan: Discussed symptom management clinic. Discussed palliative care services. Discussed resources that are available here at the cancer center. Discussed medications and new prescriptions to begin treatment such as anti-nausea or steroids.   Disposition: RTC on 06/23/2019 for initiation of chemotherapy.  Visit Diagnosis 1. 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 She also understands that She can call clinic at any time with any questions, concerns, or complaints.   I provided 15 minutes of non face-to-face telephone visit time during this  encounter, and > 50% was spent counseling as documented under my assessment & plan.   [place] [facility] [place]  CC: Dr. Rogue Bussing

## 2019-06-18 ENCOUNTER — Telehealth: Payer: Self-pay | Admitting: Oncology

## 2019-06-18 NOTE — Telephone Encounter (Signed)
Patient's daughter [name] called and reported that patient was tested positive for Covid 19. Per daughter, patient has some chills but denies any breathing difficulties or fever.   Advised patient to monitor symptoms carefully, if symptoms get worse, she needs to seek medical advice immediately.  Get rest at home and recommend well-hydration.  Precaution for family members discussed.  Advised patient to call Dr. Dwyane Luo office on Monday to update her COVID-19 status.  Likely her Mediport surgery need to be rescheduled.

## 2019-06-20 ENCOUNTER — Encounter: Admission: RE | Payer: Self-pay | Source: Home / Self Care

## 2019-06-20 ENCOUNTER — Encounter: Payer: Self-pay | Admitting: Internal Medicine

## 2019-06-20 ENCOUNTER — Encounter: Payer: Self-pay | Admitting: Physician Assistant

## 2019-06-20 ENCOUNTER — Inpatient Hospital Stay: Payer: Commercial Managed Care - PPO

## 2019-06-20 ENCOUNTER — Other Ambulatory Visit: Payer: Self-pay | Admitting: Physician Assistant

## 2019-06-20 ENCOUNTER — Ambulatory Visit
Admission: RE | Admit: 2019-06-20 | Payer: Commercial Managed Care - PPO | Source: Home / Self Care | Admitting: General Surgery

## 2019-06-20 ENCOUNTER — Inpatient Hospital Stay: Payer: Commercial Managed Care - PPO | Admitting: Internal Medicine

## 2019-06-20 ENCOUNTER — Ambulatory Visit (HOSPITAL_COMMUNITY)
Admission: RE | Admit: 2019-06-20 | Discharge: 2019-06-20 | Disposition: A | Discharge status: Home / Self Care | Payer: Commercial Managed Care - PPO | Source: Ambulatory Visit | Attending: Pulmonary Disease | Admitting: Pulmonary Disease

## 2019-06-20 ENCOUNTER — Encounter: Payer: Self-pay | Admitting: Oncology

## 2019-06-20 DIAGNOSIS — C8238 Follicular lymphoma grade IIIa, lymph nodes of multiple sites: Secondary | ICD-10-CM

## 2019-06-20 DIAGNOSIS — I10 Essential (primary) hypertension: Secondary | ICD-10-CM | POA: Insufficient documentation

## 2019-06-20 DIAGNOSIS — U071 COVID-19: Secondary | ICD-10-CM

## 2019-06-20 SURGERY — INSERTION, TUNNELED CENTRAL VENOUS DEVICE, WITH PORT
Anesthesia: Monitor Anesthesia Care

## 2019-06-20 MED ORDER — METHYLPREDNISOLONE SODIUM SUCC 125 MG IJ SOLR: 125.0000 mg | Freq: Once | INTRAMUSCULAR | Status: DC | PRN

## 2019-06-20 MED ORDER — SODIUM CHLORIDE 0.9 % IV SOLN: INTRAVENOUS | Status: DC | PRN

## 2019-06-20 MED ORDER — ALBUTEROL SULFATE HFA 108 (90 BASE) MCG/ACT IN AERS: 2.0000 | INHALATION_SPRAY | Freq: Once | RESPIRATORY_TRACT | Status: DC | PRN

## 2019-06-20 MED ORDER — FAMOTIDINE IN NACL 20-0.9 MG/50ML-% IV SOLN: 20.0000 mg | Freq: Once | INTRAVENOUS | Status: DC | PRN

## 2019-06-20 MED ORDER — DIPHENHYDRAMINE HCL 50 MG/ML IJ SOLN: 50.0000 mg | Freq: Once | INTRAMUSCULAR | Status: DC | PRN

## 2019-06-20 MED ORDER — SODIUM CHLORIDE 0.9 % IV SOLN
700.0000 mg | Freq: Once | INTRAVENOUS | Status: AC
  Administered 2019-06-20: 700 mg via INTRAVENOUS
  Filled 2019-06-20: qty 700

## 2019-06-20 MED ORDER — EPINEPHRINE 0.3 MG/0.3ML IJ SOAJ: 0.3000 mg | Freq: Once | INTRAMUSCULAR | Status: DC | PRN

## 2019-06-20 NOTE — Discharge Instructions (Signed)
COVID-19: Cmo protegerse y proteger a los dems COVID-19: How to Protect Yourself and Others Sepa cmo se propaga  Actualmente, no existe ninguna vacuna para prevenir la enfermedad por coronavirus 2019 (COVID-19).  La mejor forma de prevenir la enfermedad es evitar exponerse a este virus.  Se cree que el virus se transmite principalmente de [place] persona a [place]. ? [employer] que estn en contacto directo entre s (a una distancia inferior a 6 pies [1.87m]). ? A travs de las gotitas respiratorias producidas cuando una persona infectada tose, estornuda o habla. ? Estas gotitas pueden caer en la boca o en la nariz de las personas que estn cerca o pueden ser [employer] pulmones. ? El COVID-19 puede ser transmitido por personas que no presentan sntomas. Lo que todos deben hacer [employer] las manos con frecuencia  Lvese las manos con frecuencia con agua y jabn durante al menos 20 segundos, especialmente despus de Investment banker, corporate en un lugar pblico o despus de sonarse la nariz, toser o estornudar.  Si no dispone de [place] y [place], use un desinfectante de manos que contenga al menos un 60% de alcohol. [place] y frtelas hasta que se sientan secas.  No se toque los ojos, la nariz y la boca sin antes lavarse las manos. Evite el contacto cercano  Limite el contacto directo con otras personas tanto como sea posible.  Evite el contacto cercano con personas que estn enfermas.  Establezca distancia entre usted y [employer]. ? Recuerde que [employer] no tienen sntomas pueden transmitir el virus. ? Esto es Scientist, research (life sciences) para las personas que tienen ms riesgo de ToyProtection.fr Cbrase la boca y la nariz con una mascarilla cuando est cerca de otras personas  Puede transmitir el COVID-19 a otras personas aunque no se sienta enfermo.  Todas las  Proofreader en lugares pblicos y cuando estn con otras que no vivan en su casa, especialmente cuando el distanciamiento social sea difcil de [place]. ? Las mascarillas no deben colocarse a nios menores de 2 aos de [place], a las personas que tienen problemas respiratorios o que estn inconscientes, incapacitadas o que por algn motivo no puedan quitarse la mascarilla sin [place].  El propsito de Quarry manager es proteger a Producer, television/film/video en caso de que usted est infectado.  NO utilice las [place] a los trabajadores de [employer]	[place].  Contine manteniendo una distancia aproximada de 6 pies (1.14m) entre usted y [employer]. La mascarilla no reemplaza el distanciamiento social. Cbrase al toser y estornudar  Al toser o al estornudar, siempre cbrase la boca y la nariz con un pauelo descartable o use el interior del codo.  Deseche los pauelos descartables usados en la basura.  Inmediatamente, lvese las manos con agua y [place] durante al menos 20segundos. Si no dispone de agua y [place], [employer]ell [employer] con un desinfectante de manos que contenga al menos un 60% de alcohol. [place] y desinfecte  [place] Y desinfecte las superficies que se tocan con frecuencia todos los das. Esto incluye mesas, picaportes, interruptores de luz, encimeras, mangos, escritorios, telfonos, teclados, inodoros, grifos y lavabos. [email or web address].[email or web address]  Si las superficies estn sucias, lmpielas: Use detergente o jabn y agua antes de la desinfeccin.  Luego, use un desinfectante domstico. Physicist, medical de los desinfectantes domsticos registrados en Conservation officer, historic buildings (EPA) (Agencia de Proteccin Ambiental) aqu. [email or web address] 12/01/2018 Esta informacin no tiene como fin reemplazar el consejo del mdico.  Asegrese de hacerle al mdico cualquier pregunta que tenga. Document Revised:  12/15/2018 Document Reviewed: 10/08/2018 Elsevier Patient Education  [place]. What types of side effects do monoclonal antibody drugs cause?  Common side effects  In general, the more common side effects caused by monoclonal antibody drugs include: . Allergic reactions, such as hives or itching . Flu-like signs and symptoms, including chills, fatigue, fever, and muscle aches and pains . Nausea, vomiting . Diarrhea . Skin rashes . Low blood pressure   The CDC is recommending patients who receive monoclonal antibody treatments wait at least 90 days before being vaccinated.  Currently, there are no data on the safety and efficacy of mRNA COVID-19 vaccines in persons who received monoclonal antibodies or convalescent plasma as part of COVID-19 treatment. Based on the estimated half-life of such therapies as well as evidence suggesting that reinfection is uncommon in the 90 days after initial infection, vaccination should be deferred for at least 90 days, as a precautionary measure until additional information becomes available, to avoid interference of the antibody treatment with vaccine-induced immune responses.

## 2019-06-20 NOTE — Progress Notes (Signed)
  Diagnosis: COVID-19  Physician: Dr. Joya Gaskins  Procedure: Covid Infusion Clinic Med: bamlanivimab infusion - Provided patient with bamlanimivab fact sheet for patients, parents and caregivers prior to infusion.  Complications: No immediate complications noted.  Discharge: Discharged home   Acquanetta Chain 06/20/2019

## 2019-06-20 NOTE — Progress Notes (Signed)
  I connected by phone with Kim Contreras on 06/20/2019 at 9:30 AM to discuss the potential use of an new treatment for mild to moderate COVID-19 viral infection in non-hospitalized patients.  This patient is a 59 y.o. female that meets the FDA criteria for Emergency Use Authorization of bamlanivimab or casirivimab\imdevimab.  Has a (+) direct SARS-CoV-2 viral test result  Has mild or moderate COVID-19   Is ? 59 years of age and weighs ? 40 kg  Is NOT hospitalized due to COVID-19  Is NOT requiring oxygen therapy or requiring an increase in baseline oxygen flow rate due to COVID-19  Is within 10 days of symptom onset  Has at least one of the high risk factor(s) for progression to severe COVID-19 and/or hospitalization as defined in EUA.  Specific high risk criteria : Hypertension as well as lymphoma    I have spoken and communicated the following to the patient or parent/caregiver:  1. FDA has authorized the emergency use of bamlanivimab and casirivimab\imdevimab for the treatment of mild to moderate COVID-19 in adults and pediatric patients with positive results of direct SARS-CoV-2 viral testing who are 61 years of age and older weighing at least 40 kg, and who are at high risk for progressing to severe COVID-19 and/or hospitalization.  2. The significant known and potential risks and benefits of bamlanivimab and casirivimab\imdevimab, and the extent to which such potential risks and benefits are unknown.  3. Information on available alternative treatments and the risks and benefits of those alternatives, including clinical trials.  4. Patients treated with bamlanivimab and casirivimab\imdevimab should continue to self-isolate and use infection control measures (e.g., wear mask, isolate, social distance, avoid sharing personal items, clean and disinfect "high touch" surfaces, and frequent handwashing) according to CDC guidelines.   5. The patient or parent/caregiver has the option to  accept or refuse bamlanivimab or casirivimab\imdevimab .  After reviewing this information with the patient, The patient agreed to proceed with receiving the bamlanimivab infusion and will be provided a copy of the Fact sheet prior to receiving the infusion.   Sx onset 3/20. Set up for infusion today at 12:30pm. Directions given. Daughter helped translate.    Angelena Form PA-C 06/20/2019 9:30 AM

## 2019-06-21 ENCOUNTER — Other Ambulatory Visit: Payer: Self-pay

## 2019-06-21 ENCOUNTER — Telehealth: Payer: Self-pay | Admitting: Internal Medicine

## 2019-06-21 ENCOUNTER — Inpatient Hospital Stay (HOSPITAL_BASED_OUTPATIENT_CLINIC_OR_DEPARTMENT_OTHER): Payer: Commercial Managed Care - PPO | Admitting: Nurse Practitioner

## 2019-06-21 ENCOUNTER — Inpatient Hospital Stay: Payer: Commercial Managed Care - PPO

## 2019-06-21 DIAGNOSIS — C8238 Follicular lymphoma grade IIIa, lymph nodes of multiple sites: Secondary | ICD-10-CM | POA: Diagnosis not present

## 2019-06-21 DIAGNOSIS — U071 COVID-19: Secondary | ICD-10-CM

## 2019-06-21 NOTE — Progress Notes (Signed)
Virtual Visit Progress Note  Symptom Management Clinic [facility]  [phone] [phone]  I connected with Kim Contreras on 06/21/19 at  1:00 PM EDT by video enabled telemedicine visit and verified that I am speaking with the correct person using two identifiers.   I discussed the limitations, risks, security and privacy concerns of performing an evaluation and management service by telemedicine and the availability of in-person appointments. I also discussed with the patient that there may be a patient responsible charge related to this service. The patient expressed understanding and agreed to proceed.   Other persons participating in the visit and their role in the encounter: Aundra Millet, interpreter  Patient's location: home Provider's location: clinic    Patient Care Team: Center, [facility] as PCP - General (General Practice)   Name of the patient: Kim Contreras  EH:[id number]  [birth year]   Date of visit: 123XX123  Diagnosis- Follicular Lymphoma  Chief complaint/ Reason for visit- covid infection   Heme/Onc history:  Oncology History Overview Note  # 5th FEB 2021- ABDOMINAL MASS-  9.3 x 3.0 cm central mesenteric soft tissue mass is identified on image 38/series 2. 3.0x 3.0 cm collar of soft tissue surrounds branches of the superior mesenteric artery and vein on image 47/2 with relatively little mass-effect on the vascular anatomy. 2.0 x 1.5 cm nodular component of this abnormal soft tissue is identified in the mesentery of the right pelvis on 55/2. FEB 2021- PET-  PET scan-February 2021-SUV around 5.5 again highly suggestive of malignancy lymphoma.  Small cluster left supra pelvic lymph node/small retroperitoneal lymph nodes-5 mm-Douville score 3.   # MARCH 123XX123- grade 3A follicular lymphoma [Open biopsy Dr. 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      Interval history- Kim Contreras, 59 year old female, diagnosed with follicular lymphoma planning to start Obi-Benda chemotherapy, who recently was found to have covid-19 positive test. She says that she became symptomatic a on 3/20/21with cough, runny nose, headache, chills, loss of taste and smell. She denies fever, Gi symptoms, chest congestion, or phlegm. She has been taking tylenol with some improvement in symptoms. On 06/20/19 she received bamlanivimab, monoclonal antibody treatment which she tolerated well without significant side effects.   Review of systems- Review of Systems  Constitutional: Positive for chills and malaise/fatigue. Negative for fever and weight loss.  HENT: Positive for congestion. Negative for hearing loss, nosebleeds, sore throat and tinnitus.        Loss of taste & smell per hpi  Eyes: Negative for blurred vision and double vision.  Respiratory: Positive for cough. Negative for hemoptysis, shortness of breath and wheezing.   Cardiovascular: Negative for chest pain, palpitations and leg swelling.  Gastrointestinal: Negative for abdominal pain, blood in stool, constipation, diarrhea, melena, nausea and vomiting.  Genitourinary: Negative for dysuria  and urgency.  Musculoskeletal: Negative for back pain, falls, joint pain and myalgias.  Skin: Negative for itching and rash.  Neurological: Positive for headaches.  Negative for dizziness, tingling, sensory change, loss of consciousness and weakness.  Endo/Heme/Allergies: Negative for environmental allergies. Does not bruise/bleed easily.  Psychiatric/Behavioral: Negative for depression. The patient is not nervous/anxious and does not have insomnia.      No Known Allergies  Past Medical History:  Diagnosis Date  . Anemia 2006  . Anxiety   . Arthritis   . Helicobacter pylori gastritis   . History of hiatal hernia   . Hypertension   . Lymphoma [facility])     Past Surgical History:  Procedure Laterality Date  . APPENDECTOMY    . CHOLECYSTECTOMY N/A 01/19/2015   Procedure: LAPAROSCOPIC CHOLECYSTECTOMY;  [name], MD;  [name];  Service: General;  [name];  . COLONOSCOPY WITH PROPOFOL N/A 10/27/2014   Procedure: COLONOSCOPY WITH PROPOFOL;  [name], MD;  Location: [facility] ENDOSCOPY;  Service: Endoscopy;  [name];  . ESOPHAGOGASTRODUODENOSCOPY    . EXCISION MASS ABDOMINAL N/A 06/03/2019   Procedure: EXCISION MASS ABDOMINAL;  [name], MD;  [name];  Service: General;  [name];  abdominal node biopsy  . NASAL SINUS SURGERY    . TUBAL LIGATION      Social History   Socioeconomic History  . Marital status: Married    Spouse name: Not on file  . Number of children: Not on file  . Years of education: Not on file  . Highest education level: Not on file  Occupational History  . Not on file  Tobacco Use  . Smoking status: Never Smoker  . Smokeless tobacco: Never Used  Substance and Sexual Activity  . Alcohol use: No  . Drug use: No  . Sexual activity: Not on file  Other Topics Concern  . Not on file  Social History Narrative  . Not on file   Social Determinants of Health   Financial Resource Strain:   . Difficulty of Paying Living Expenses:   Food Insecurity:   . Worried About Charity fundraiser in the Last Year:   . Arboriculturist in the  Last Year:   Transportation Needs:   . Film/video editor (Medical):   Marland Kitchen Lack of Transportation (Non-Medical):   Physical Activity:   . Days of Exercise per Week:   . Minutes of Exercise per Session:   Stress:   . Feeling of Stress :   Social Connections:   . Frequency of Communication with Friends and Family:   . Frequency of Social Gatherings with Friends and Family:   . Attends Religious Services:   . Active Member of Clubs or Organizations:   . Attends Archivist Meetings:   Marland Kitchen Marital Status:   Intimate Partner Violence:   . Fear of Current or Ex-Partner:   . Emotionally Abused:   Marland Kitchen Physically Abused:   . Sexually Abused:     Family History  Problem Relation Age of Onset  . Breast cancer Other 43     Current Outpatient Medications:  .  dexamethasone (DECADRON) 4 MG tablet, Take 1 tablet (4 mg total) by mouth 2 (two) times daily. Start taking 2 days prior to each infusion for a total for 2 days., Disp: 30 tablet, Rfl: 1 .  HYDROcodone-acetaminophen (NORCO/VICODIN) 5-325 MG tablet, Take 1-2 tablets by mouth every 4 (four) hours as needed  for moderate pain. (Patient not taking: Reported on 06/10/2019), Disp: 20 tablet, Rfl: 0 .  levocetirizine (XYZAL) 5 MG tablet, Take 5 mg by mouth every evening., Disp: , Rfl:  .  lidocaine-prilocaine (EMLA) cream, Apply 1 application topically as needed., Disp: 30 g, Rfl: 3 .  losartan (COZAAR) 50 MG tablet, Take 50 mg by mouth daily., Disp: , Rfl:  .  montelukast (SINGULAIR) 10 MG tablet, Take 1 tablet (10 mg total) by mouth at bedtime., Disp: 30 tablet, Rfl: 3 .  ondansetron (ZOFRAN ODT) 4 MG disintegrating tablet, Take 1 tablet (4 mg total) by mouth every 8 (eight) hours as needed for nausea or vomiting. (Patient not taking: Reported on 06/16/2019), Disp: 20 tablet, Rfl: 0 .  pantoprazole (PROTONIX) 20 MG tablet, Take 20 mg by mouth daily., Disp: , Rfl:  .  senna-docusate (SENOKOT-S) 8.6-50 MG tablet, Take 1 tablet by mouth 2  (two) times daily., Disp: 120 tablet, Rfl: 0  Physical exam: Exam limited due to telemedicine  Physical Exam Constitutional:      General: She is not in acute distress. Skin:    Coloration: Skin is not pale.  Neurological:     Mental Status: She is alert and oriented to person, place, and time.  Psychiatric:        Mood and Affect: Mood normal.        Behavior: Behavior normal.      CMP Latest Ref Rng & Units 05/07/2019  Glucose 70 - 99 mg/dL 119(H)  BUN 6 - 20 mg/dL 15  Creatinine 0.44 - 1.00 mg/dL 0.84  Sodium 135 - 145 mmol/L 140  Potassium 3.5 - 5.1 mmol/L 3.6  Chloride 98 - 111 mmol/L 104  CO2 22 - 32 mmol/L 28  Calcium 8.9 - 10.3 mg/dL 9.3  Total Protein 6.5 - 8.1 g/dL 8.2(H)  Total Bilirubin 0.3 - 1.2 mg/dL 0.5  Alkaline Phos 38 - 126 U/L 64  AST 15 - 41 U/L 16  ALT 0 - 44 U/L 15   CBC Latest Ref Rng & Units 05/07/2019  WBC 4.0 - 10.5 K/uL 4.7  Hemoglobin 12.0 - 15.0 g/dL 13.4  Hematocrit 36.0 - 46.0 % 38.3  Platelets 150 - 400 K/uL 264    No images are attached to the encounter.  No results found.  Assessment and plan- Patient is a 59 y.o. female diagnosed with follicular lymphoma, planning to start chemotherapy, recently diagnosed with COVID-19 infection (positive test on 06/17/19) status post monoclonal antibody treatment/bamlanivimab on 06/20/2019, who presents to symptom management clinic for follow-up for symptoms of covid infection.   1. Covid-19 Infection: patient has known malignancy but not on active treatment. Discussed with Dr. Rogue Bussing who recommends home isolation for 10 days from positive test. After that time, if covid symptoms improving, no new symptoms, afebrile without fever suppressing medications, then she can discontinue isolation precautions and no repeat testing is advised.  I've encouraged her to check her pulse ox at least 3 times a day and that oxygen levels should be greater than 90%. If she develops shortness of breath, hypoxia, or chest  pain, recommended evaluation in ER.   2. Congestion & Cough- start mucinex & dextromethorphan/Robitussin. Discussed mechanism of action of these medications and importance of staying well hydrated. Declines tessalon today. Can reconsider in the future if symptoms refractory or consider tussionex. Hold off on antibiotic treatment at this time. Advised that if symptoms progress, may consider antibiotics for likely secondary bacterial pneumonia.   3. Follicular Lymphoma-  Will plan for her to start treatment around 07/11/19 or 21 days from her positive test given immunocompromised status of patient population.   4. Port-a-Cath: recommend rescheduling port placement. I've encouraged her to reach out to Dr. Dwyane Luo office to get port placement.   Disposition:  Follow up with Dr. Rogue Bussing for labs, re-evaluation, and consideration of initiation of chemo on 07/11/19. Reach out to Dr. Dwyane Luo office regarding port scheduling. Home isolation as directed. If symptoms worsen, notify clinic and consider ER evaluation.    Visit Diagnosis 1. Grade 3a follicular lymphoma of lymph nodes of multiple regions ([place])   2. COVID-19 virus infection    Patient expressed understanding and was in agreement with this plan. She also understands that She can call clinic at any time with any questions, concerns, or complaints.   I discussed the assessment and treatment plan with the patient. The patient was provided an opportunity to ask questions and all were answered. The patient agreed with the plan and demonstrated an understanding of the instructions.   The patient was advised to call back or seek an in-person evaluation if the symptoms worsen or if the condition fails to improve as anticipated.   I provided 25 minutes of face-to-face video visit time during this encounter, and > 50% was spent counseling as documented under my assessment & plan. Due to language barrier, an interpreter was present during the  history-taking and subsequent discussion (and for part of the physical exam) with this patient.  Thank you for allowing me to participate in the care of this very pleasant patient.   Beckey Rutter, DNP, AGNP-C [facility]: Dr. Rogue Bussing

## 2019-06-21 NOTE — Telephone Encounter (Signed)
On 3/22-spoke with daughter regarding patient's Covid positive status.  Patient received infusion at [facility] clinic on 3/22.   Discussed the signs and symptoms also-checking pulse ox on a regular basis.  Recommend Robitussin OTC for cough.  If any symptomatic worsening noted reach out to-us or PCP.

## 2019-06-21 NOTE — Patient Instructions (Signed)
When You Can be Around Others After You Had or Likely Had COVID-19?  I think or know I had COVID-19, and I had symptoms You can be around others after:  . 10 days since symptoms first appeared and . 24 hours with no fever without the use of fever-reducing medications and . Other symptoms of COVID-19 are improving* *Loss of taste and smell may persist for weeks or months after recovery and need not delay the end of isolation  Most people do not require testing to decide when they can be around others.  For Anyone Who Has Been Around a Person with COVID-19 Anyone who has had close contact with someone with COVID-19 should stay home for 10 days after their last exposure to that person.  . The best way to protect yourself and others is to stay home for 10 days if you think you've been exposed to someone who has COVID-19. Check your local health department's website for information about options in your area to possibly shorten this quarantine period. However, anyone who has had close contact with someone with COVID-19 and who meets the following criteria does NOT need to stay home.  Marland Kitchen Has COVID-19 illness within the previous 3 months and  . Has recovered and  . Remains without COVID-19 symptoms (for example, cough, shortness of breath)  Confirmed and suspected cases of reinfection of the virus that causes COVID-19 Cases of reinfection of COVID-19 have been reported but are rare. In general, reinfection means a person was infected (got sick) once, recovered, and then later became infected again. Based on what we know from similar viruses, some reinfections are expected.

## 2019-06-23 ENCOUNTER — Inpatient Hospital Stay: Payer: Commercial Managed Care - PPO

## 2019-06-23 ENCOUNTER — Inpatient Hospital Stay: Payer: Commercial Managed Care - PPO | Admitting: Internal Medicine

## 2019-06-24 ENCOUNTER — Inpatient Hospital Stay: Payer: Commercial Managed Care - PPO

## 2019-07-04 NOTE — Progress Notes (Signed)
Pharmacist Chemotherapy Monitoring - Initial Assessment    Anticipated start date: 07/11/19   Regimen:  . Are orders appropriate based on the patient's diagnosis, regimen, and cycle? Yes . Does the plan date match the patient's scheduled date? Yes . Is the sequencing of drugs appropriate? Yes . Are the premedications appropriate for the patient's regimen? Yes . Prior Authorization for treatment is: Approved o If applicable, is the correct biosimilar selected based on the patient's insurance? not applicable  Organ Function and Labs: Marland Kitchen Are dose adjustments needed based on the patient's renal function, hepatic function, or hematologic function? Yes . Are appropriate labs ordered prior to the start of patient's treatment? Yes . Other organ system assessment, if indicated: N/A . The following baseline labs, if indicated, have been ordered: obinutuzumab: baseline Hepatitis B labs  Dose Assessment: . Are the drug doses appropriate? Yes . Are the following correct: o Drug concentrations Yes o IV fluid compatible with drug Yes o Administration routes Yes o Timing of therapy Yes . If applicable, does the patient have documented access for treatment and/or plans for port-a-cath placement? yes . If applicable, have lifetime cumulative doses been properly documented and assessed? not applicable Lifetime Dose Tracking  No doses have been documented on this patient for the following tracked chemicals: Doxorubicin, Epirubicin, Idarubicin, Daunorubicin, Mitoxantrone, Bleomycin, Oxaliplatin, Carboplatin, Liposomal Doxorubicin  o   Toxicity Monitoring/Prevention: . The patient has the following take home antiemetics prescribed: Lorazepam . The patient has the following take home medications prescribed: N/A . Medication allergies and previous infusion related reactions, if applicable, have been reviewed and addressed. Yes . The patient's current medication list has been assessed for drug-drug  interactions with their chemotherapy regimen. no significant drug-drug interactions were identified on review.  Order Review: . Are the treatment plan orders signed? Yes . Is the patient scheduled to see a provider prior to their treatment? Yes  I verify that I have reviewed each item in the above checklist and answered each question accordingly.  Gared Gillie K 07/04/2019 9:26 AM

## 2019-07-08 ENCOUNTER — Ambulatory Visit: Payer: Commercial Managed Care - PPO | Admitting: Anesthesiology

## 2019-07-08 ENCOUNTER — Ambulatory Visit
Admission: RE | Admit: 2019-07-08 | Discharge: 2019-07-08 | Disposition: A | Discharge status: Home / Self Care | Payer: Commercial Managed Care - PPO | Attending: General Surgery | Admitting: General Surgery

## 2019-07-08 ENCOUNTER — Encounter
Admission: RE | Disposition: A | Discharge status: Home / Self Care | Payer: Self-pay | Source: Home / Self Care | Attending: General Surgery

## 2019-07-08 ENCOUNTER — Other Ambulatory Visit: Payer: Self-pay

## 2019-07-08 ENCOUNTER — Encounter: Payer: Self-pay | Admitting: General Surgery

## 2019-07-08 ENCOUNTER — Ambulatory Visit: Payer: Commercial Managed Care - PPO

## 2019-07-08 DIAGNOSIS — Z79899 Other long term (current) drug therapy: Secondary | ICD-10-CM | POA: Insufficient documentation

## 2019-07-08 DIAGNOSIS — F419 Anxiety disorder, unspecified: Secondary | ICD-10-CM | POA: Insufficient documentation

## 2019-07-08 DIAGNOSIS — D649 Anemia, unspecified: Secondary | ICD-10-CM | POA: Insufficient documentation

## 2019-07-08 DIAGNOSIS — C8590 Non-Hodgkin lymphoma, unspecified, unspecified site: Secondary | ICD-10-CM | POA: Diagnosis present

## 2019-07-08 DIAGNOSIS — Z8616 Personal history of COVID-19: Secondary | ICD-10-CM | POA: Diagnosis not present

## 2019-07-08 DIAGNOSIS — I10 Essential (primary) hypertension: Secondary | ICD-10-CM | POA: Diagnosis not present

## 2019-07-08 DIAGNOSIS — Z95828 Presence of other vascular implants and grafts: Secondary | ICD-10-CM

## 2019-07-08 HISTORY — PX: PORTACATH PLACEMENT: SHX2246

## 2019-07-08 SURGERY — INSERTION, TUNNELED CENTRAL VENOUS DEVICE, WITH PORT
Anesthesia: General | Laterality: Left

## 2019-07-08 MED ORDER — PROMETHAZINE HCL 25 MG/ML IJ SOLN: 6.2500 mg | INTRAMUSCULAR | Status: DC | PRN

## 2019-07-08 MED ORDER — MIDAZOLAM HCL 2 MG/2ML IJ SOLN
INTRAMUSCULAR | Status: DC | PRN
  Administered 2019-07-08: 1 mg via INTRAVENOUS

## 2019-07-08 MED ORDER — CEFAZOLIN SODIUM-DEXTROSE 2-4 GM/100ML-% IV SOLN
2.0000 g | Freq: Once | INTRAVENOUS | Status: AC
  Administered 2019-07-08: 11:00:00 2 g via INTRAVENOUS

## 2019-07-08 MED ORDER — FENTANYL CITRATE (PF) 100 MCG/2ML IJ SOLN: 25.0000 ug | INTRAMUSCULAR | Status: DC | PRN

## 2019-07-08 MED ORDER — ACETAMINOPHEN 500 MG PO TABS: 1000.0000 mg | ORAL_TABLET | Freq: Once | ORAL | Status: DC | PRN

## 2019-07-08 MED ORDER — LIDOCAINE HCL (PF) 1 % IJ SOLN
INTRAMUSCULAR | Status: DC | PRN
  Administered 2019-07-08: 10 mL

## 2019-07-08 MED ORDER — SODIUM CHLORIDE (PF) 0.9 % IJ SOLN
INTRAMUSCULAR | Status: AC
  Filled 2019-07-08: qty 50

## 2019-07-08 MED ORDER — GLYCOPYRROLATE 0.2 MG/ML IJ SOLN
INTRAMUSCULAR | Status: DC | PRN
  Administered 2019-07-08: .2 mg via INTRAVENOUS

## 2019-07-08 MED ORDER — MIDAZOLAM HCL 2 MG/2ML IJ SOLN
INTRAMUSCULAR | Status: AC
  Filled 2019-07-08: qty 2

## 2019-07-08 MED ORDER — SODIUM CHLORIDE (PF) 0.9 % IJ SOLN
INTRAMUSCULAR | Status: DC | PRN
  Administered 2019-07-08: 25 mL

## 2019-07-08 MED ORDER — KETOROLAC TROMETHAMINE 30 MG/ML IJ SOLN: 30.0000 mg | Freq: Once | INTRAMUSCULAR | Status: DC | PRN

## 2019-07-08 MED ORDER — OXYCODONE HCL 5 MG PO TABS: 5.0000 mg | ORAL_TABLET | Freq: Once | ORAL | Status: DC | PRN

## 2019-07-08 MED ORDER — LACTATED RINGERS IV SOLN: INTRAVENOUS | Status: DC

## 2019-07-08 MED ORDER — OXYCODONE HCL 5 MG/5ML PO SOLN: 5.0000 mg | Freq: Once | ORAL | Status: DC | PRN

## 2019-07-08 MED ORDER — LIDOCAINE HCL (CARDIAC) PF 100 MG/5ML IV SOSY
PREFILLED_SYRINGE | INTRAVENOUS | Status: DC | PRN
  Administered 2019-07-08: 40 mg via INTRAVENOUS

## 2019-07-08 MED ORDER — ONDANSETRON HCL 4 MG/2ML IJ SOLN
INTRAMUSCULAR | Status: DC | PRN
  Administered 2019-07-08: 4 mg via INTRAVENOUS

## 2019-07-08 MED ORDER — PHENYLEPHRINE HCL (PRESSORS) 10 MG/ML IV SOLN
INTRAVENOUS | Status: DC | PRN
  Administered 2019-07-08: 100 ug via INTRAVENOUS
  Administered 2019-07-08 (×5): 50 ug via INTRAVENOUS

## 2019-07-08 MED ORDER — FENTANYL CITRATE (PF) 100 MCG/2ML IJ SOLN
INTRAMUSCULAR | Status: AC
  Filled 2019-07-08: qty 2

## 2019-07-08 MED ORDER — PROPOFOL 10 MG/ML IV BOLUS
INTRAVENOUS | Status: AC
  Filled 2019-07-08: qty 20

## 2019-07-08 MED ORDER — PROPOFOL 500 MG/50ML IV EMUL
INTRAVENOUS | Status: DC | PRN
  Administered 2019-07-08: 100 ug/kg/min via INTRAVENOUS

## 2019-07-08 MED ORDER — LIDOCAINE HCL (PF) 2 % IJ SOLN
INTRAMUSCULAR | Status: AC
  Filled 2019-07-08: qty 5

## 2019-07-08 MED ORDER — FENTANYL CITRATE (PF) 100 MCG/2ML IJ SOLN
INTRAMUSCULAR | Status: DC | PRN
  Administered 2019-07-08 (×2): 25 ug via INTRAVENOUS

## 2019-07-08 MED ORDER — CEFAZOLIN SODIUM-DEXTROSE 2-4 GM/100ML-% IV SOLN
INTRAVENOUS | Status: AC
  Filled 2019-07-08: qty 100

## 2019-07-08 MED ORDER — LIDOCAINE HCL (PF) 1 % IJ SOLN
INTRAMUSCULAR | Status: AC
  Filled 2019-07-08: qty 30

## 2019-07-08 SURGICAL SUPPLY — 30 items
BLADE SURG 15 STRL SS SAFETY (BLADE) ×3 IMPLANT
CHLORAPREP W/TINT 26 (MISCELLANEOUS) ×3 IMPLANT
CLOSURE WOUND 1/2 X4 (GAUZE/BANDAGES/DRESSINGS) ×1
COVER LIGHT HANDLE STERIS (MISCELLANEOUS) ×6 IMPLANT
COVER WAND RF STERILE (DRAPES) ×3 IMPLANT
DECANTER SPIKE VIAL GLASS SM (MISCELLANEOUS) ×6 IMPLANT
DRAPE C-ARM XRAY 36X54 (DRAPES) ×3 IMPLANT
DRAPE LAPAROTOMY TRNSV 106X77 (MISCELLANEOUS) ×3 IMPLANT
DRSG TEGADERM 2-3/8X2-3/4 SM (GAUZE/BANDAGES/DRESSINGS) ×3 IMPLANT
DRSG TEGADERM 4X4.75 (GAUZE/BANDAGES/DRESSINGS) ×3 IMPLANT
DRSG TELFA 4X3 1S NADH ST (GAUZE/BANDAGES/DRESSINGS) ×3 IMPLANT
ELECT REM PT RETURN 9FT ADLT (ELECTROSURGICAL) ×3
ELECTRODE REM PT RTRN 9FT ADLT (ELECTROSURGICAL) ×1 IMPLANT
GLOVE BIO SURGEON STRL SZ7.5 (GLOVE) ×7 IMPLANT
GLOVE INDICATOR 8.0 STRL GRN (GLOVE) ×7 IMPLANT
GOWN STRL REUS W/ TWL LRG LVL3 (GOWN DISPOSABLE) ×2 IMPLANT
GOWN STRL REUS W/TWL LRG LVL3 (GOWN DISPOSABLE) ×6
KIT PORT POWER 8FR ISP CVUE (Port) ×3 IMPLANT
KIT TURNOVER KIT A (KITS) ×3 IMPLANT
LABEL OR SOLS (LABEL) ×3 IMPLANT
NS IRRIG 500ML POUR BTL (IV SOLUTION) ×3 IMPLANT
PACK PORT-A-CATH (MISCELLANEOUS) ×3 IMPLANT
STRIP CLOSURE SKIN 1/2X4 (GAUZE/BANDAGES/DRESSINGS) ×2 IMPLANT
SUT PROLENE 3 0 SH DA (SUTURE) ×3 IMPLANT
SUT VIC AB 3-0 SH 27 (SUTURE) ×2
SUT VIC AB 3-0 SH 27X BRD (SUTURE) ×1 IMPLANT
SUT VIC AB 4-0 FS2 27 (SUTURE) ×3 IMPLANT
SWABSTK COMLB BENZOIN TINCTURE (MISCELLANEOUS) ×3 IMPLANT
SYR 10ML LL (SYRINGE) ×3 IMPLANT
SYR 10ML SLIP (SYRINGE) ×3 IMPLANT

## 2019-07-08 NOTE — Anesthesia Postprocedure Evaluation (Signed)
Anesthesia Post Note  Patient: Kim Contreras  Procedure(s) Performed: INSERTION PORT-A-CATH (Left )  Patient location during evaluation: PACU Anesthesia Type: General Level of consciousness: awake and alert Pain management: pain level controlled Vital Signs Assessment: post-procedure vital signs reviewed and stable Respiratory status: spontaneous breathing, nonlabored ventilation and respiratory function stable Cardiovascular status: blood pressure returned to baseline and stable Postop Assessment: no apparent nausea or vomiting Anesthetic complications: no     Last Vitals:  Vitals:   07/08/19 1222 07/08/19 1232  BP: 120/86 129/88  Pulse: 93 71  Resp: 16 17  Temp: (!) 36.4 C 36.8 C  SpO2: 98% 98%    Last Pain:  Vitals:   07/08/19 1232  TempSrc:   PainSc: 0-No pain                 Tera Mater

## 2019-07-08 NOTE — Anesthesia Preprocedure Evaluation (Signed)
Anesthesia Evaluation  Patient identified by MRN, date of birth, ID band Patient awake    Reviewed: Allergy & Precautions, NPO status , Patient's Chart, lab work & pertinent test results  History of Anesthesia Complications Negative for: history of anesthetic complications  Airway Mallampati: II  TM Distance: >3 FB Neck ROM: Full    Dental no notable dental hx.    Pulmonary neg pulmonary ROS, neg sleep apnea, neg COPD,    breath sounds clear to auscultation- rhonchi (-) wheezing      Cardiovascular hypertension, Pt. on medications (-) CAD, (-) Past MI, (-) Cardiac Stents and (-) CABG  Rhythm:Regular Rate:Normal - Systolic murmurs and - Diastolic murmurs    Neuro/Psych neg Seizures Anxiety negative neurological ROS     GI/Hepatic Neg liver ROS, hiatal hernia,   Endo/Other  negative endocrine ROSneg diabetes  Renal/GU negative Renal ROS     Musculoskeletal  (+) Arthritis ,   Abdominal (+) - obese,   Peds  Hematology  (+) anemia ,   Anesthesia Other Findings Past Medical History: 2006: Anemia No date: Anxiety No date: Arthritis No date: Helicobacter pylori gastritis No date: History of hiatal hernia No date: Hypertension No date: Lymphoma ([place])   Reproductive/Obstetrics                             Anesthesia Physical Anesthesia Plan  ASA: II  Anesthesia Plan: General   Post-op Pain Management:    Induction: Intravenous  PONV Risk Score and Plan: 2 and Propofol infusion  Airway Management Planned: Natural Airway  Additional Equipment:   Intra-op Plan:   Post-operative Plan:   Informed Consent: I have reviewed the patients History and Physical, chart, labs and discussed the procedure including the risks, benefits and alternatives for the proposed anesthesia with the patient or authorized representative who has indicated his/her understanding and acceptance.     Dental  advisory given  Plan Discussed with: CRNA and Anesthesiologist  Anesthesia Plan Comments:         Anesthesia Quick Evaluation

## 2019-07-08 NOTE — H&P (Signed)
Kim Contreras [id number] 05-07-1960     HPI:  Patient with lymphoma in need of central venous access. Procedure originally scheduled for March 22 cancelled as patient developed a COVID infection. Feeling well at present.   Medications Prior to Admission  Medication Sig Dispense Refill Last Dose  . levocetirizine (XYZAL) 5 MG tablet Take 5 mg by mouth every evening.   07/07/2019 at Unknown time  . losartan (COZAAR) 50 MG tablet Take 50 mg by mouth daily.   07/07/2019 at Unknown time  . montelukast (SINGULAIR) 10 MG tablet Take 1 tablet (10 mg total) by mouth at bedtime. 30 tablet 3 07/07/2019 at Unknown time  . pantoprazole (PROTONIX) 20 MG tablet Take 20 mg by mouth daily.   07/07/2019 at Unknown time  . senna-docusate (SENOKOT-S) 8.6-50 MG tablet Take 1 tablet by mouth 2 (two) times daily. 120 tablet 0 07/07/2019 at Unknown time  . dexamethasone (DECADRON) 4 MG tablet Take 1 tablet (4 mg total) by mouth 2 (two) times daily. Start taking 2 days prior to each infusion for a total for 2 days. (Patient not taking: Reported on 07/08/2019) 30 tablet 1 Not Taking  . HYDROcodone-acetaminophen (NORCO/VICODIN) 5-325 MG tablet Take 1-2 tablets by mouth every 4 (four) hours as needed for moderate pain. (Patient not taking: Reported on 06/10/2019) 20 tablet 0   . lidocaine-prilocaine (EMLA) cream Apply 1 application topically as needed. 30 g 3   . ondansetron (ZOFRAN ODT) 4 MG disintegrating tablet Take 1 tablet (4 mg total) by mouth every 8 (eight) hours as needed for nausea or vomiting. (Patient not taking: Reported on 06/16/2019) 20 tablet 0    No Known Allergies Past Medical History:  Diagnosis Date  . Anemia 2006  . Anxiety   . Arthritis   . Helicobacter pylori gastritis   . History of hiatal hernia   . Hypertension   . Lymphoma [facility])    Past Surgical History:  Procedure Laterality Date  . APPENDECTOMY    . CHOLECYSTECTOMY N/A 01/19/2015   Procedure: LAPAROSCOPIC CHOLECYSTECTOMY;  [name], MD;  [name];  Service: General;  [name];  . COLONOSCOPY WITH PROPOFOL N/A 10/27/2014   Procedure: COLONOSCOPY WITH PROPOFOL;  [name], MD;  Location: [facility] ENDOSCOPY;  Service: Endoscopy;  [name];  . ESOPHAGOGASTRODUODENOSCOPY    . EXCISION MASS ABDOMINAL N/A 06/03/2019   Procedure: EXCISION MASS ABDOMINAL;  [name], MD;  [name];  Service: General;  [name];  abdominal node biopsy  . NASAL SINUS SURGERY    . TUBAL LIGATION     Social History   Socioeconomic History  . Marital status: Married    Spouse name: Not on file  . Number of children: Not on file  . Years of education: Not on file  . Highest education level: Not on file  Occupational History  . Not on file  Tobacco Use  . Smoking status: Never Smoker  . Smokeless tobacco: Never Used  Substance and Sexual Activity  . Alcohol use: No  . Drug use: No  . Sexual activity: Not on file  Other Topics Concern  . Not on file  Social History Narrative  . Not on file   Social Determinants of Health   Financial Resource Strain:   . Difficulty of Paying Living Expenses:   Food Insecurity:   . Worried About Charity fundraiser in the Last Year:   . Arboriculturist in  the Last Year:   Transportation Needs:   . Film/video editor (Medical):   Marland Kitchen Lack of Transportation (Non-Medical):   Physical Activity:   . Days of Exercise per Week:   . Minutes of Exercise per Session:   Stress:   . Feeling of Stress :   Social Connections:   . Frequency of Communication with Friends and Family:   . Frequency of Social Gatherings with Friends and Family:   . Attends Religious Services:   . Active Member of Clubs or Organizations:   . Attends Archivist Meetings:   Marland Kitchen Marital Status:   Intimate Partner Violence:   . Fear of Current or Ex-Partner:   . Emotionally Abused:   Marland Kitchen Physically Abused:   . Sexually Abused:    Social History    Social History Narrative  . Not on file     ROS: Negative.     PE: HEENT: Negative. Lungs: Clear. Cardio: RR.   Assessment/Plan:  Proceed with planned port placement.    Forest Gleason [facility] 07/08/2019

## 2019-07-08 NOTE — Op Note (Signed)
Preoperative diagnosis: Lymphoma, need for central venous access.  Postoperative diagnosis: Same.  Operative procedure: Left subclavian PowerPort placement with ultrasound and fluoroscopic guidance.  Operating surgeon: Hervey Ard, MD.  Anesthesia: Attended local, 10 cc 1% plain Xylocaine.  Estimated blood loss: 2 cc.  Clinical note: This 59 year old woman has been identified with lymphoma and central venous access has been requested by her treating oncologist.  She received Ancef prior to the procedure.  Operative note: The patient underwent sedation and tolerated this well.  The left chest and neck was cleansed with ChloraPrep and draped.  In Trendelenburg position ultrasound was used to confirm patency of the subclavian vein.  The vein was then cannulated after instillation of local anesthesia.  The guidewire was passed followed by the dilator.  The catheter was positioned at the junction of the SVC and right atrium making use of fluoroscopy.  It was tunneled to a pocket on the left anterior chest.  The port was anchored to the deep tissue with interrupted 3-0 Prolene sutures x2.  The port easily irrigated and aspirated.  The adipose layer was closed with a running 3-0 Vicryl.  The skin was closed with a running 4-0 Vicryl subcuticular suture.  Benzoin Steri-Strips followed by Telfa and Tegaderm dressings were applied.  The patient was brought to the recovery in stable condition.    Erect portable chest x-ray showed the catheter tip as described above and no evidence of pneumothorax.

## 2019-07-08 NOTE — Transfer of Care (Signed)
Immediate Anesthesia Transfer of Care Note  Patient: Kim Contreras  Procedure(s) Performed: INSERTION PORT-A-CATH (Left )  Patient Location: PACU  Anesthesia Type:General  Level of Consciousness: drowsy  Airway & Oxygen Therapy: Patient Spontanous Breathing and Patient connected to face mask oxygen  Post-op Assessment: Report given to RN and Post -op Vital signs reviewed and stable  Post vital signs: Reviewed and stable  Last Vitals:  Vitals Value Taken Time  BP 116/86 07/08/19 1152  Temp    Pulse 66 07/08/19 1153  Resp 12 07/08/19 1153  SpO2 100 % 07/08/19 1153  Vitals shown include unvalidated device data.  Last Pain:  Vitals:   07/08/19 1009  TempSrc: Oral  PainSc: 0-No pain         Complications: No apparent anesthesia complications

## 2019-07-11 ENCOUNTER — Inpatient Hospital Stay: Payer: Commercial Managed Care - PPO | Admitting: Internal Medicine

## 2019-07-11 ENCOUNTER — Inpatient Hospital Stay: Payer: Commercial Managed Care - PPO

## 2019-07-11 ENCOUNTER — Other Ambulatory Visit: Payer: Self-pay

## 2019-07-11 ENCOUNTER — Inpatient Hospital Stay: Payer: Commercial Managed Care - PPO | Attending: Internal Medicine

## 2019-07-11 ENCOUNTER — Encounter: Payer: Self-pay | Admitting: Internal Medicine

## 2019-07-11 VITALS — BP 138/70 | HR 60 | Temp 97.4°F | Resp 18

## 2019-07-11 VITALS — BP 152/96 | HR 61 | Temp 97.3°F | Resp 18 | Wt 140.2 lb

## 2019-07-11 DIAGNOSIS — R110 Nausea: Secondary | ICD-10-CM | POA: Insufficient documentation

## 2019-07-11 DIAGNOSIS — I10 Essential (primary) hypertension: Secondary | ICD-10-CM | POA: Insufficient documentation

## 2019-07-11 DIAGNOSIS — Z79899 Other long term (current) drug therapy: Secondary | ICD-10-CM | POA: Diagnosis not present

## 2019-07-11 DIAGNOSIS — Z7189 Other specified counseling: Secondary | ICD-10-CM

## 2019-07-11 DIAGNOSIS — M7989 Other specified soft tissue disorders: Secondary | ICD-10-CM

## 2019-07-11 DIAGNOSIS — Z7952 Long term (current) use of systemic steroids: Secondary | ICD-10-CM | POA: Diagnosis not present

## 2019-07-11 DIAGNOSIS — M1990 Unspecified osteoarthritis, unspecified site: Secondary | ICD-10-CM | POA: Diagnosis not present

## 2019-07-11 DIAGNOSIS — R5383 Other fatigue: Secondary | ICD-10-CM | POA: Insufficient documentation

## 2019-07-11 DIAGNOSIS — Z452 Encounter for adjustment and management of vascular access device: Secondary | ICD-10-CM | POA: Insufficient documentation

## 2019-07-11 DIAGNOSIS — C8238 Follicular lymphoma grade IIIa, lymph nodes of multiple sites: Secondary | ICD-10-CM

## 2019-07-11 DIAGNOSIS — R109 Unspecified abdominal pain: Secondary | ICD-10-CM | POA: Insufficient documentation

## 2019-07-11 DIAGNOSIS — Z596 Low income: Secondary | ICD-10-CM | POA: Insufficient documentation

## 2019-07-11 DIAGNOSIS — Z5111 Encounter for antineoplastic chemotherapy: Secondary | ICD-10-CM | POA: Insufficient documentation

## 2019-07-11 DIAGNOSIS — Z5112 Encounter for antineoplastic immunotherapy: Secondary | ICD-10-CM | POA: Diagnosis not present

## 2019-07-11 LAB — CBC WITH DIFFERENTIAL/PLATELET
Abs Immature Granulocytes: 0.09 10*3/uL — ABNORMAL HIGH (ref 0.00–0.07)
Basophils Absolute: 0 10*3/uL (ref 0.0–0.1)
Basophils Relative: 0 %
Eosinophils Absolute: 0 10*3/uL (ref 0.0–0.5)
Eosinophils Relative: 0 %
HCT: 38.1 % (ref 36.0–46.0)
Hemoglobin: 13.1 g/dL (ref 12.0–15.0)
Immature Granulocytes: 1 %
Lymphocytes Relative: 6 %
Lymphs Abs: 0.9 10*3/uL (ref 0.7–4.0)
MCH: 32.1 pg (ref 26.0–34.0)
MCHC: 34.4 g/dL (ref 30.0–36.0)
MCV: 93.4 fL (ref 80.0–100.0)
Monocytes Absolute: 0.8 10*3/uL (ref 0.1–1.0)
Monocytes Relative: 5 %
Neutro Abs: 12.8 10*3/uL — ABNORMAL HIGH (ref 1.7–7.7)
Neutrophils Relative %: 88 %
Platelets: 281 10*3/uL (ref 150–400)
RBC: 4.08 MIL/uL (ref 3.87–5.11)
RDW: 12.9 % (ref 11.5–15.5)
WBC: 14.5 10*3/uL — ABNORMAL HIGH (ref 4.0–10.5)
nRBC: 0 % (ref 0.0–0.2)

## 2019-07-11 LAB — COMPREHENSIVE METABOLIC PANEL
ALT: 22 U/L (ref 0–44)
AST: 23 U/L (ref 15–41)
Albumin: 4.2 g/dL (ref 3.5–5.0)
Alkaline Phosphatase: 73 U/L (ref 38–126)
Anion gap: 9 (ref 5–15)
BUN: 18 mg/dL (ref 6–20)
CO2: 25 mmol/L (ref 22–32)
Calcium: 9.2 mg/dL (ref 8.9–10.3)
Chloride: 104 mmol/L (ref 98–111)
Creatinine, Ser: 0.67 mg/dL (ref 0.44–1.00)
GFR calc Af Amer: >60 mL/min (ref >60)
GFR calc non Af Amer: >60 mL/min (ref >60)
Glucose, Bld: 136 mg/dL — ABNORMAL HIGH (ref 70–99)
Potassium: 3.8 mmol/L (ref 3.5–5.1)
Sodium: 138 mmol/L (ref 135–145)
Total Bilirubin: 0.6 mg/dL (ref 0.3–1.2)
Total Protein: 8.2 g/dL — ABNORMAL HIGH (ref 6.5–8.1)

## 2019-07-11 LAB — LACTATE DEHYDROGENASE: LDH: 149 U/L (ref 98–192)

## 2019-07-11 MED ORDER — PALONOSETRON HCL INJECTION 0.25 MG/5ML
0.2500 mg | Freq: Once | INTRAVENOUS | Status: AC
  Administered 2019-07-11: 10:00:00 0.25 mg via INTRAVENOUS
  Filled 2019-07-11: qty 5

## 2019-07-11 MED ORDER — HEPARIN SOD (PORK) LOCK FLUSH 100 UNIT/ML IV SOLN
500.0000 [IU] | Freq: Once | INTRAVENOUS | Status: AC | PRN
  Administered 2019-07-11: 17:00:00 500 [IU]
  Filled 2019-07-11: qty 5

## 2019-07-11 MED ORDER — ACETAMINOPHEN 325 MG PO TABS
650.0000 mg | ORAL_TABLET | Freq: Once | ORAL | Status: AC
  Administered 2019-07-11: 650 mg via ORAL
  Filled 2019-07-11: qty 2

## 2019-07-11 MED ORDER — HEPARIN SOD (PORK) LOCK FLUSH 100 UNIT/ML IV SOLN
500.0000 [IU] | Freq: Once | INTRAVENOUS | Status: AC
  Filled 2019-07-11: qty 5

## 2019-07-11 MED ORDER — SODIUM CHLORIDE 0.9 % IV SOLN
20.0000 mg | Freq: Once | INTRAVENOUS | Status: AC
  Administered 2019-07-11: 20 mg via INTRAVENOUS
  Filled 2019-07-11: qty 20

## 2019-07-11 MED ORDER — SODIUM CHLORIDE 0.9% FLUSH
10.0000 mL | Freq: Once | INTRAVENOUS | Status: AC
  Administered 2019-07-11: 10 mL via INTRAVENOUS
  Filled 2019-07-11: qty 10

## 2019-07-11 MED ORDER — SODIUM CHLORIDE 0.9 % IV SOLN
1000.0000 mg | Freq: Once | INTRAVENOUS | Status: AC
  Administered 2019-07-11: 12:00:00 1000 mg via INTRAVENOUS
  Filled 2019-07-11: qty 40

## 2019-07-11 MED ORDER — SODIUM CHLORIDE 0.9 % IV SOLN
90.0000 mg/m2 | Freq: Once | INTRAVENOUS | Status: AC
  Administered 2019-07-11: 16:00:00 150 mg via INTRAVENOUS
  Filled 2019-07-11: qty 6

## 2019-07-11 MED ORDER — HEPARIN SOD (PORK) LOCK FLUSH 100 UNIT/ML IV SOLN
INTRAVENOUS | Status: AC
  Filled 2019-07-11: qty 5

## 2019-07-11 MED ORDER — TRAMADOL HCL 50 MG PO TABS: 50.0000 mg | ORAL_TABLET | Freq: Four times a day (QID) | ORAL | 0 refills | Status: DC | PRN

## 2019-07-11 MED ORDER — SODIUM CHLORIDE 0.9 % IV SOLN
Freq: Once | INTRAVENOUS | Status: AC
  Filled 2019-07-11: qty 250

## 2019-07-11 MED ORDER — DIPHENHYDRAMINE HCL 50 MG/ML IJ SOLN
50.0000 mg | Freq: Once | INTRAMUSCULAR | Status: AC
  Administered 2019-07-11: 10:00:00 50 mg via INTRAVENOUS
  Filled 2019-07-11: qty 1

## 2019-07-11 NOTE — Progress Notes (Signed)
[place] NOTE  Patient Care Team: [facility], [facility] as PCP - General (General Practice)  CHIEF COMPLAINTS/PURPOSE OF CONSULTATION: Lymphoma   Oncology History Overview Note  # 5th FEB 2021- ABDOMINAL MASS-  9.3 x 3.0 cm central mesenteric soft tissue mass is identified on image 38/series 2. 3.0x 3.0 cm collar of soft tissue surrounds branches of the superior mesenteric artery and vein on image 47/2 with relatively little mass-effect on the vascular anatomy. 2.0 x 1.5 cm nodular component of this abnormal soft tissue is identified in the mesentery of the right pelvis on 55/2. FEB 2021- PET-  PET scan-February 2021-SUV around 5.5 again highly suggestive of malignancy lymphoma.  Small cluster left supra pelvic lymph node/small retroperitoneal lymph nodes-5 mm-Douville score 3.   # MARCH [id number]- grade 3A follicular lymphoma [Open biopsy Dr. 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       HISTORY OF PRESENTING ILLNESS: Patient speaks minimal English/interpreter virtual present/patient daughter [name] in office.  Wendee Copp 59 y.o.  female with newly diagnosed follicle lymphoma grade 3 is here to proceed with chemoimmunotherapy.  Patient's treatment was delayed because of recent Covid  infection.  Patient received monoclonal antibody infusion.  She did not need any hospitalizations.  Recovered well...  Patient's laparoscopic procedure was uneventful.  She has recovered fairly well.  Patient complains of swelling and mild to moderate discomfort in the right upper extremity.  States that the pain started since the time of port placement which was approximately 3 weeks ago.    Review of Systems  Constitutional: Positive for malaise/fatigue. Negative for chills, diaphoresis, fever and weight loss.  HENT: Negative for nosebleeds and sore throat.   Eyes: Negative for double vision.  Respiratory: Negative for cough, hemoptysis, sputum production, shortness of breath and wheezing.   Cardiovascular: Negative for chest pain, palpitations, orthopnea and leg swelling.  Gastrointestinal: Positive for abdominal pain and nausea. Negative for blood in stool, constipation, diarrhea, heartburn, melena and vomiting.  Genitourinary: Negative for dysuria, frequency and urgency.  Musculoskeletal: Positive for back pain and joint pain.  Skin: Negative.  Negative for itching and rash.  Neurological: Positive for tingling (in hands x 2 months) and headaches. Negative for dizziness, focal weakness and weakness.  Endo/Heme/Allergies: Does not bruise/bleed easily.  Psychiatric/Behavioral: Negative for depression. The patient is not nervous/anxious and does not have insomnia.      MEDICAL HISTORY:  Past Medical History:  Diagnosis Date  . Anemia 2006  . Anxiety   . Arthritis   . Helicobacter pylori gastritis   . History of hiatal hernia   . Hypertension   . Lymphoma ([place])     SURGICAL HISTORY: Past Surgical History:  Procedure Laterality Date  . APPENDECTOMY    . CHOLECYSTECTOMY N/A 01/19/2015  Procedure: LAPAROSCOPIC CHOLECYSTECTOMY;  [name], MD;  [name];  Service: General;  [name];  . COLONOSCOPY WITH PROPOFOL N/A 10/27/2014   Procedure:  COLONOSCOPY WITH PROPOFOL;  [name], MD;  Location: [facility] ENDOSCOPY;  Service: Endoscopy;  [name];  . ESOPHAGOGASTRODUODENOSCOPY    . EXCISION MASS ABDOMINAL N/A 06/03/2019   Procedure: EXCISION MASS ABDOMINAL;  [name], MD;  [name];  Service: General;  [name];  abdominal node biopsy  . NASAL SINUS SURGERY    . PORTACATH PLACEMENT Left 07/08/2019   Procedure: INSERTION PORT-A-CATH;  [name], MD;  [name];  Service: General;  Laterality: Left;  . TUBAL LIGATION      SOCIAL HISTORY: Social History   Socioeconomic History  . Marital status: Married    Spouse name: Not on file  . Number of children: Not on file  . Years of education: Not on file  . Highest education level: Not on file  Occupational History  . Not on file  Tobacco Use  . Smoking status: Never Smoker  . Smokeless tobacco: Never Used  Substance and Sexual Activity  . Alcohol use: No  . Drug use: No  . Sexual activity: Not on file  Other Topics Concern  . Not on file  Social History Narrative  . Not on file   Social Determinants of Health   Financial Resource Strain:   . Difficulty of Paying Living Expenses:   Food Insecurity:   . Worried About Charity fundraiser in the Last Year:   . Arboriculturist in the Last Year:   Transportation Needs:   . Film/video editor (Medical):   Marland Kitchen Lack of Transportation (Non-Medical):   Physical Activity:   . Days of Exercise per Week:   . Minutes of Exercise per Session:   Stress:   . Feeling of Stress :   Social Connections:   . Frequency of Communication with Friends and Family:   . Frequency of Social Gatherings with Friends and Family:   . Attends Religious Services:   . Active Member of Clubs or Organizations:   . Attends Archivist Meetings:   Marland Kitchen Marital Status:   Intimate Partner Violence:   . Fear of Current or Ex-Partner:   . Emotionally Abused:   Marland Kitchen  Physically Abused:   . Sexually Abused:     FAMILY HISTORY: Family History  Problem Relation Age of Onset  . Breast cancer Other 42    ALLERGIES:  has No Known Allergies.  MEDICATIONS:  Current Outpatient Medications  Medication Sig Dispense Refill  . dexamethasone (DECADRON) 4 MG tablet Take 1 tablet (4 mg total) by mouth 2 (two) times daily. Start taking 2 days prior to each infusion for a total for 2 days. 30 tablet 1  . levocetirizine (XYZAL) 5 MG tablet Take 5 mg by mouth every evening.    . lidocaine-prilocaine (EMLA) cream Apply 1 application topically as needed. 30 g 3  . losartan (COZAAR) 50 MG tablet Take 50 mg by mouth daily.    . montelukast (SINGULAIR) 10 MG tablet Take 1 tablet (10 mg total) by mouth at bedtime. 30 tablet 3  . pantoprazole (PROTONIX) 20 MG tablet Take 20 mg by mouth daily.    Marland Kitchen HYDROcodone-acetaminophen (NORCO/VICODIN) 5-325 MG tablet Take 1-2 tablets by mouth every 4 (four) hours as needed for moderate pain. (Patient not taking: Reported on 06/10/2019) 20 tablet  0  . ondansetron (ZOFRAN ODT) 4 MG disintegrating tablet Take 1 tablet (4 mg total) by mouth every 8 (eight) hours as needed for nausea or vomiting. (Patient not taking: Reported on 06/16/2019) 20 tablet 0  . senna-docusate (SENOKOT-S) 8.6-50 MG tablet Take 1 tablet by mouth 2 (two) times daily. (Patient not taking: Reported on 07/11/2019) 120 tablet 0  . traMADol (ULTRAM) 50 MG tablet Take 1 tablet (50 mg total) by mouth every 6 (six) hours as needed. 30 tablet 0   No current facility-administered medications for this visit.   Facility-Administered Medications Ordered in Other Visits  Medication Dose Route Frequency Provider Last Rate Last Admin  . bendamustine (BENDEKA) 150 mg in sodium chloride 0.9 % 50 mL (2.6786 mg/mL) chemo infusion  90 mg/m2 (Treatment Plan Recorded) Intravenous Once Charlaine Dalton R, MD      . heparin lock flush 100 unit/mL  500 Units Intravenous Once Charlaine Dalton R, MD      . heparin lock flush 100 unit/mL  500 Units Intracatheter Once PRN [name], MD      . obinutuzumab (GAZYVA) 1,[name]  1,000 mg Intravenous Once [name], MD          .  PHYSICAL EXAMINATION: ECOG PERFORMANCE STATUS: 0 - Asymptomatic  Vitals:   07/11/19 0836  BP: (!) 152/96  Pulse: 61  Resp: 18  Temp: (!) 97.3 F (36.3 C)   Filed Weights   07/11/19 0836  Weight: 140 lb 4 oz (63.6 kg)    Physical Exam  Constitutional: She is oriented to person, place, and time and well-developed, well-nourished, and in no distress.  HENT:  Head: Normocephalic and atraumatic.  Mouth/Throat: Oropharynx is clear and moist. No oropharyngeal exudate.  Eyes: Pupils are equal, round, and reactive to light.  Cardiovascular: Normal rate and regular rhythm.  Pulmonary/Chest: Effort normal and breath sounds normal. No respiratory distress. She has no wheezes.  Abdominal: Soft. Bowel sounds are normal. She exhibits no distension and no mass. There is no abdominal tenderness. There is no rebound and no guarding.  Musculoskeletal:        General: No tenderness or edema. Normal range of motion.     Cervical back: Normal range of motion and neck supple.     Comments: Mild swelling of the left upper extremity; tenderness on deep palpation.  Neurological: She is alert and oriented to person, place, and time.  Skin: Skin is warm.  Psychiatric: Affect normal.     LABORATORY DATA:  I have reviewed the data as listed Lab Results  Component Value Date   WBC 14.5 (H) 07/11/2019   HGB 13.1 07/11/2019   HCT 38.1 07/11/2019   MCV 93.4 07/11/2019   PLT 281 07/11/2019   Recent Labs    05/07/19 1625 07/11/19 0816  NA 140 138  K 3.6 3.8  CL 104 104  CO2 28 25  GLUCOSE 119* 136*  BUN 15 18  CREATININE 0.84 0.67  CALCIUM 9.3 9.2  GFRNONAA >60 >60  GFRAA >60 >60  PROT 8.2* 8.2*  ALBUMIN 4.6 4.2   AST 16 23  ALT 15 22  ALKPHOS 64 73  BILITOT 0.5 0.6    RADIOGRAPHIC STUDIES: I have personally reviewed the radiological images as listed and agreed with the findings in the report. DG Chest Port 1 View  Result Date: 07/08/2019 CLINICAL DATA:  Port-A-Cath insertion. EXAM: PORTABLE CHEST 1 VIEW  COMPARISON:  PET-CT 05/18/2019.  Portable chest 06/26/2018. FINDINGS: PowerPort catheter with tip over upper right atrium. Heart size stable. Low lung volumes. Mild bilateral pulmonary infiltrates/edema. No pleural effusion or pneumothorax. No acute bony abnormality. Surgical clips right upper quadrant. IMPRESSION: 1.  PowerPort catheter with tip over upper right atrium. 2.  Lung volumes.  Mild bilateral pulmonary infiltrates/edema. Electronically Signed   By: Marcello Moores  Register   On: 07/08/2019 12:08   DG C-Arm 1-60 Min-No Report  Result Date: 07/08/2019 Fluoroscopy was utilized by the requesting physician.  No radiographic interpretation.    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  Proceed with Obi-Benda cycle 1 day 1 today.  #Again reviewed the potential side effects including infusion reactions; risk of infections nausea vomiting etc.  Again reviewed patient's premedications.  # LEFT UE pain/swelling-the etiology is unclear recommend stat ultrasound Dopplers tomorrow.   # DISPOSITION:call pt's daughter [maria] for appts.  #Proceed with chemotherapy today/tomorrow-benda # [place] Left UE STAT-tomorrow.  #1 week-MD; labs CBC BMP; Gazyva # 2 weeks- MD-labs- cbc/cmp--Gazyva- Dr.B    All questions were answered. The patient knows to call the clinic with any problems, questions or concerns.    [name], MD 07/11/2019 11:49 AM

## 2019-07-11 NOTE — Progress Notes (Signed)
Interpreter Waiver signed today by pt. Patient declined/refued spanish interpreter. Chip Boer, hospital interpreter discussed the hospital waiver with the patient and alerted nursing staff that patient declined interpreter today/future apts.

## 2019-07-11 NOTE — Assessment & Plan Note (Addendum)
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  Proceed with Obi-Benda cycle 1 day 1 today.  #Again reviewed the potential side effects including infusion reactions; risk of infections nausea vomiting etc.  Again reviewed patient's premedications.  # LEFT UE pain/swelling-the etiology is unclear recommend stat ultrasound Dopplers tomorrow.  For now prescription for tramadol given for pain control.  # DISPOSITION:call pt's daughter [maria] for appts.  #Proceed with chemotherapy today/tomorrow-benda # [place] Left UE STAT-tomorrow.  #1 week-MD; labs CBC BMP; Gazyva # 2 weeks- MD-labs- cbc/cmp--Gazyva- Dr.B

## 2019-07-12 ENCOUNTER — Ambulatory Visit: Payer: Commercial Managed Care - PPO

## 2019-07-12 ENCOUNTER — Ambulatory Visit
Admission: RE | Admit: 2019-07-12 | Discharge: 2019-07-12 | Disposition: A | Discharge status: Home / Self Care | Payer: Commercial Managed Care - PPO | Source: Ambulatory Visit | Attending: Internal Medicine | Admitting: Internal Medicine

## 2019-07-12 ENCOUNTER — Inpatient Hospital Stay: Payer: Commercial Managed Care - PPO

## 2019-07-12 ENCOUNTER — Telehealth: Payer: Self-pay | Admitting: Internal Medicine

## 2019-07-12 VITALS — BP 155/81 | HR 68 | Temp 96.0°F | Resp 20

## 2019-07-12 DIAGNOSIS — M7989 Other specified soft tissue disorders: Secondary | ICD-10-CM | POA: Insufficient documentation

## 2019-07-12 DIAGNOSIS — C8238 Follicular lymphoma grade IIIa, lymph nodes of multiple sites: Secondary | ICD-10-CM | POA: Diagnosis present

## 2019-07-12 DIAGNOSIS — Z7189 Other specified counseling: Secondary | ICD-10-CM

## 2019-07-12 MED ORDER — SODIUM CHLORIDE 0.9% FLUSH
10.0000 mL | INTRAVENOUS | Status: DC | PRN
  Administered 2019-07-12: 10 mL
  Filled 2019-07-12: qty 10

## 2019-07-12 MED ORDER — SODIUM CHLORIDE 0.9 % IV SOLN
90.0000 mg/m2 | Freq: Once | INTRAVENOUS | Status: AC
  Administered 2019-07-12: 150 mg via INTRAVENOUS
  Filled 2019-07-12: qty 6

## 2019-07-12 MED ORDER — SODIUM CHLORIDE 0.9 % IV SOLN
10.0000 mg | Freq: Once | INTRAVENOUS | Status: AC
  Administered 2019-07-12: 10 mg via INTRAVENOUS
  Filled 2019-07-12: qty 10

## 2019-07-12 MED ORDER — SODIUM CHLORIDE 0.9 % IV SOLN
Freq: Once | INTRAVENOUS | Status: AC
  Filled 2019-07-12: qty 250

## 2019-07-12 MED ORDER — HEPARIN SOD (PORK) LOCK FLUSH 100 UNIT/ML IV SOLN
INTRAVENOUS | Status: AC
  Filled 2019-07-12: qty 5

## 2019-07-12 MED ORDER — HEPARIN SOD (PORK) LOCK FLUSH 100 UNIT/ML IV SOLN
500.0000 [IU] | Freq: Once | INTRAVENOUS | Status: AC | PRN
  Administered 2019-07-12: 500 [IU]
  Filled 2019-07-12: qty 5

## 2019-07-12 NOTE — Telephone Encounter (Signed)
On 4/13-spoke to patient daughter regarding the results of the ultrasound left upper extremity-negative for DVT.  Discussed with Dr. Marcine Matar inflammation s/p for placement.  Recommend NSAIDs twice a day for the next 4 to 5 days; icing elevation.  We will again review at next visit/1 week.  FYI

## 2019-07-13 ENCOUNTER — Ambulatory Visit: Payer: Commercial Managed Care - PPO

## 2019-07-18 ENCOUNTER — Inpatient Hospital Stay: Payer: Commercial Managed Care - PPO

## 2019-07-18 ENCOUNTER — Other Ambulatory Visit: Payer: Self-pay

## 2019-07-18 VITALS — BP 125/69 | HR 69 | Temp 98.6°F | Resp 17 | Wt 139.1 lb

## 2019-07-18 DIAGNOSIS — C8238 Follicular lymphoma grade IIIa, lymph nodes of multiple sites: Secondary | ICD-10-CM

## 2019-07-18 DIAGNOSIS — Z7189 Other specified counseling: Secondary | ICD-10-CM

## 2019-07-18 LAB — CBC WITH DIFFERENTIAL/PLATELET
Abs Immature Granulocytes: 0.15 10*3/uL — ABNORMAL HIGH (ref 0.00–0.07)
Basophils Absolute: 0 10*3/uL (ref 0.0–0.1)
Basophils Relative: 0 %
Eosinophils Absolute: 0 10*3/uL (ref 0.0–0.5)
Eosinophils Relative: 0 %
HCT: 37.9 % (ref 36.0–46.0)
Hemoglobin: 12.9 g/dL (ref 12.0–15.0)
Immature Granulocytes: 1 %
Lymphocytes Relative: 1 %
Lymphs Abs: 0.1 10*3/uL — ABNORMAL LOW (ref 0.7–4.0)
MCH: 31.6 pg (ref 26.0–34.0)
MCHC: 34 g/dL (ref 30.0–36.0)
MCV: 92.9 fL (ref 80.0–100.0)
Monocytes Absolute: 0.6 10*3/uL (ref 0.1–1.0)
Monocytes Relative: 5 %
Neutro Abs: 11.6 10*3/uL — ABNORMAL HIGH (ref 1.7–7.7)
Neutrophils Relative %: 93 %
Platelets: 258 10*3/uL (ref 150–400)
RBC: 4.08 MIL/uL (ref 3.87–5.11)
RDW: 13.1 % (ref 11.5–15.5)
WBC: 12.5 10*3/uL — ABNORMAL HIGH (ref 4.0–10.5)
nRBC: 0 % (ref 0.0–0.2)

## 2019-07-18 LAB — BASIC METABOLIC PANEL
Anion gap: 11 (ref 5–15)
BUN: 18 mg/dL (ref 6–20)
CO2: 24 mmol/L (ref 22–32)
Calcium: 9 mg/dL (ref 8.9–10.3)
Chloride: 102 mmol/L (ref 98–111)
Creatinine, Ser: 0.59 mg/dL (ref 0.44–1.00)
GFR calc Af Amer: >60 mL/min (ref >60)
GFR calc non Af Amer: >60 mL/min (ref >60)
Glucose, Bld: 123 mg/dL — ABNORMAL HIGH (ref 70–99)
Potassium: 3.9 mmol/L (ref 3.5–5.1)
Sodium: 137 mmol/L (ref 135–145)

## 2019-07-18 MED ORDER — SODIUM CHLORIDE 0.9 % IV SOLN
1000.0000 mg | Freq: Once | INTRAVENOUS | Status: AC
  Administered 2019-07-18: 11:00:00 1000 mg via INTRAVENOUS
  Filled 2019-07-18: qty 40

## 2019-07-18 MED ORDER — ACETAMINOPHEN 325 MG PO TABS
650.0000 mg | ORAL_TABLET | Freq: Once | ORAL | Status: AC
  Administered 2019-07-18: 10:00:00 650 mg via ORAL
  Filled 2019-07-18: qty 2

## 2019-07-18 MED ORDER — HEPARIN SOD (PORK) LOCK FLUSH 100 UNIT/ML IV SOLN
500.0000 [IU] | Freq: Once | INTRAVENOUS | Status: AC | PRN
  Administered 2019-07-18: 500 [IU]
  Filled 2019-07-18: qty 5

## 2019-07-18 MED ORDER — SODIUM CHLORIDE 0.9 % IV SOLN
10.0000 mg | Freq: Once | INTRAVENOUS | Status: AC
  Administered 2019-07-18: 10:00:00 10 mg via INTRAVENOUS
  Filled 2019-07-18: qty 10

## 2019-07-18 MED ORDER — HEPARIN SOD (PORK) LOCK FLUSH 100 UNIT/ML IV SOLN
INTRAVENOUS | Status: AC
  Filled 2019-07-18: qty 5

## 2019-07-18 MED ORDER — DIPHENHYDRAMINE HCL 50 MG/ML IJ SOLN
50.0000 mg | Freq: Once | INTRAMUSCULAR | Status: AC
  Administered 2019-07-18: 10:00:00 50 mg via INTRAVENOUS
  Filled 2019-07-18: qty 1

## 2019-07-18 MED ORDER — SODIUM CHLORIDE 0.9 % IV SOLN
Freq: Once | INTRAVENOUS | Status: AC
  Filled 2019-07-18: qty 250

## 2019-07-18 NOTE — Progress Notes (Signed)

## 2019-07-18 NOTE — Progress Notes (Signed)
Pt tolerated infusion well. Pt and VS stable at discharge.  

## 2019-07-19 ENCOUNTER — Encounter: Payer: Self-pay | Admitting: Pharmacy Technician

## 2019-07-19 NOTE — Progress Notes (Signed)
Patient has been approved for drug assistance by [place] for [employer]. The enrollment period is from 07/18/19-indefinitely based on EOOP. First DOS covered is 07/18/19.

## 2019-07-25 ENCOUNTER — Inpatient Hospital Stay: Payer: Commercial Managed Care - PPO

## 2019-07-25 ENCOUNTER — Encounter: Payer: Self-pay | Admitting: Internal Medicine

## 2019-07-25 ENCOUNTER — Other Ambulatory Visit: Payer: Self-pay

## 2019-07-25 ENCOUNTER — Inpatient Hospital Stay: Payer: Commercial Managed Care - PPO | Admitting: Internal Medicine

## 2019-07-25 ENCOUNTER — Other Ambulatory Visit: Payer: Self-pay | Admitting: *Deleted

## 2019-07-25 DIAGNOSIS — C8208 Follicular lymphoma grade I, lymph nodes of multiple sites: Secondary | ICD-10-CM

## 2019-07-25 DIAGNOSIS — C8238 Follicular lymphoma grade IIIa, lymph nodes of multiple sites: Secondary | ICD-10-CM | POA: Diagnosis not present

## 2019-07-25 LAB — CBC WITH DIFFERENTIAL/PLATELET
Abs Immature Granulocytes: 0.24 10*3/uL — ABNORMAL HIGH (ref 0.00–0.07)
Basophils Absolute: 0 10*3/uL (ref 0.0–0.1)
Basophils Relative: 0 %
Eosinophils Absolute: 0 10*3/uL (ref 0.0–0.5)
Eosinophils Relative: 0 %
HCT: 37.4 % (ref 36.0–46.0)
Hemoglobin: 12.9 g/dL (ref 12.0–15.0)
Immature Granulocytes: 1 %
Lymphocytes Relative: 2 %
Lymphs Abs: 0.3 10*3/uL — ABNORMAL LOW (ref 0.7–4.0)
MCH: 32.4 pg (ref 26.0–34.0)
MCHC: 34.5 g/dL (ref 30.0–36.0)
MCV: 94 fL (ref 80.0–100.0)
Monocytes Absolute: 0.7 10*3/uL (ref 0.1–1.0)
Monocytes Relative: 4 %
Neutro Abs: 16.7 10*3/uL — ABNORMAL HIGH (ref 1.7–7.7)
Neutrophils Relative %: 93 %
Platelets: 269 10*3/uL (ref 150–400)
RBC: 3.98 MIL/uL (ref 3.87–5.11)
RDW: 13.7 % (ref 11.5–15.5)
WBC: 18 10*3/uL — ABNORMAL HIGH (ref 4.0–10.5)
nRBC: 0 % (ref 0.0–0.2)

## 2019-07-25 LAB — COMPREHENSIVE METABOLIC PANEL
ALT: 73 U/L — ABNORMAL HIGH (ref 0–44)
AST: 31 U/L (ref 15–41)
Albumin: 3.9 g/dL (ref 3.5–5.0)
Alkaline Phosphatase: 74 U/L (ref 38–126)
Anion gap: 12 (ref 5–15)
BUN: 18 mg/dL (ref 6–20)
CO2: 22 mmol/L (ref 22–32)
Calcium: 8.9 mg/dL (ref 8.9–10.3)
Chloride: 102 mmol/L (ref 98–111)
Creatinine, Ser: 0.64 mg/dL (ref 0.44–1.00)
GFR calc Af Amer: >60 mL/min (ref >60)
GFR calc non Af Amer: >60 mL/min (ref >60)
Glucose, Bld: 131 mg/dL — ABNORMAL HIGH (ref 70–99)
Potassium: 3.7 mmol/L (ref 3.5–5.1)
Sodium: 136 mmol/L (ref 135–145)
Total Bilirubin: 0.5 mg/dL (ref 0.3–1.2)
Total Protein: 7.5 g/dL (ref 6.5–8.1)

## 2019-07-25 LAB — URINALYSIS, COMPLETE (UACMP) WITH MICROSCOPIC
Bilirubin Urine: NEGATIVE
Glucose, UA: NEGATIVE mg/dL
Ketones, ur: NEGATIVE mg/dL
Leukocytes,Ua: NEGATIVE
Nitrite: NEGATIVE
Protein, ur: NEGATIVE mg/dL
Specific Gravity, Urine: 1.015 (ref 1.005–1.030)
Squamous Epithelial / HPF: NONE SEEN (ref 0–5)
pH: 6.5 (ref 5.0–8.0)

## 2019-07-25 MED ORDER — HEPARIN SOD (PORK) LOCK FLUSH 100 UNIT/ML IV SOLN
500.0000 [IU] | Freq: Once | INTRAVENOUS | Status: AC
  Administered 2019-07-25: 500 [IU] via INTRAVENOUS
  Filled 2019-07-25: qty 5

## 2019-07-25 MED ORDER — SODIUM CHLORIDE 0.9% FLUSH
10.0000 mL | Freq: Once | INTRAVENOUS | Status: AC
  Administered 2019-07-25: 10 mL via INTRAVENOUS
  Filled 2019-07-25: qty 10

## 2019-07-25 NOTE — Assessment & Plan Note (Addendum)
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 On Obi-Benda  #Hold Obi-Benda cycle 1 day 15 today. Labs today reviewed;  acceptable for treatment today-given the dysuria/flank pain [see below]  # RIGHT flank pain/ abodminal pain- ? Etiology.  Check UA/culture  # DISPOSITION:call pt's daughter [maria] for appts. # 4/28Kelli Churn; # 4 weeks- MD-labs- cbc/cmp--Benda-Gazyva; D-2-Benda- Dr.B  Addendum: UA negative for infection.  However, plan chemotherapy tomorrow given conflict with chemotherapy schedule.

## 2019-07-25 NOTE — Progress Notes (Signed)
No treatment at this time, pt rescheduled to 07/27/19.

## 2019-07-25 NOTE — Progress Notes (Signed)
[place] NOTE  Patient Care Team: Center, [facility] as PCP - General (General Practice)  CHIEF COMPLAINTS/PURPOSE OF CONSULTATION: Lymphoma   Oncology History Overview Note  # 5th FEB 2021- ABDOMINAL MASS-  9.3 x 3.0 cm central mesenteric soft tissue mass is identified on image 38/series 2. 3.0x 3.0 cm collar of soft tissue surrounds branches of the superior mesenteric artery and vein on image 47/2 with relatively little mass-effect on the vascular anatomy. 2.0 x 1.5 cm nodular component of this abnormal soft tissue is identified in the mesentery of the right pelvis on 55/2. FEB 2021- PET-  PET scan-February 2021-SUV around 5.5 again highly suggestive of malignancy lymphoma.  Small cluster left supra pelvic lymph node/small retroperitoneal lymph nodes-5 mm-Douville score 3.   # MARCH [id number]- grade 3A follicular lymphoma [Open biopsy Dr. 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 ([place])   07/11/2019 -  Chemotherapy   The patient had palonosetron (ALOXI) injection 0.25 mg, 0.25 mg, Intravenous,  Once, 1 of 6 cycles Administration: 0.25 mg (07/11/2019) bendamustine (BENDEKA) 150 mg in sodium chloride 0.9 % 50 mL (2.6786 mg/mL) chemo infusion, 90 mg/m2 = 150 mg, Intravenous,  Once, 1 of 6 cycles Administration: 150 mg (07/11/2019), 150 mg (07/12/2019) obinutuzumab (GAZYVA) 1,[name], 1,000 mg, Intravenous, Once, 1 of 6 cycles Administration: 1,000 mg (07/11/2019), 1,000 mg (07/18/2019), 1,000 mg (07/27/2019)  for chemotherapy treatment.       HISTORY OF PRESENTING ILLNESS: Patient speaks minimal English/patient daughter [name] in office.  Cordelia Poche Rubinstein 59 y.o.  female with newly  diagnosed follicle lymphoma grade 3 is here to proceed with cycle number 1 day 15 Gazyva.  Patient tolerated chemotherapy cycle number 1 day 1 and 8 well.  Patient complains of pain in the right flank pain for the last few days.  Mild nausea no vomiting.  Review of Systems  Constitutional: Positive for malaise/fatigue. Negative for chills, diaphoresis, fever and weight loss.  HENT: Negative for nosebleeds and sore throat.   Eyes: Negative for double vision.  Respiratory: Negative for cough, hemoptysis, sputum production, shortness of breath and wheezing.   Cardiovascular: Negative for chest pain, palpitations, orthopnea and leg swelling.  Gastrointestinal: Positive for abdominal pain and nausea. Negative for blood in stool, constipation, diarrhea, heartburn, melena and vomiting.  Genitourinary: Negative for dysuria, frequency and urgency.  Musculoskeletal: Positive for back pain and joint pain.  Skin: Negative.  Negative for itching and rash.  Neurological: Positive for tingling (in hands x 2 months) and headaches. Negative for dizziness, focal weakness and weakness.  Endo/Heme/Allergies: Does not bruise/bleed easily.  Psychiatric/Behavioral: Negative for depression. The patient is not nervous/anxious and does not have insomnia.      MEDICAL HISTORY:  Past Medical History:  Diagnosis Date  . Anemia 2006  . Anxiety   . Arthritis   . Helicobacter pylori gastritis   . History of hiatal hernia   . Hypertension   . Lymphoma ([place])     SURGICAL HISTORY: Past Surgical History:  Procedure Laterality Date  . APPENDECTOMY    . CHOLECYSTECTOMY N/A 01/19/2015   Procedure: LAPAROSCOPIC CHOLECYSTECTOMY;  [name], MD;  [name];  Service: General;  [name];  . COLONOSCOPY WITH PROPOFOL N/A 10/27/2014   Procedure: COLONOSCOPY WITH PROPOFOL;  [name], MD;  Location: [facility] ENDOSCOPY;  Service: Endoscopy;  [name];  .  ESOPHAGOGASTRODUODENOSCOPY    . EXCISION MASS ABDOMINAL N/A 06/03/2019   Procedure: EXCISION MASS ABDOMINAL;  [name], MD;  [name];  Service: General;  [name];  abdominal node biopsy  . NASAL SINUS SURGERY    . PORTACATH PLACEMENT Left 07/08/2019   Procedure: INSERTION PORT-A-CATH;  [name], MD;  [name];  Service: General;  Laterality: Left;  . TUBAL LIGATION      SOCIAL HISTORY: Social History   Socioeconomic History  . Marital status: Married    Spouse name: Not on file  . Number of children: Not on file  . Years of education: Not on file  . Highest education level: Not on file  Occupational History  . Not on file  Tobacco Use  . Smoking status: Never Smoker  . Smokeless tobacco: Never Used  Substance and Sexual Activity  . Alcohol use: No  . Drug use: No  . Sexual activity: Not on file  Other Topics Concern  . Not on file  Social History Narrative  . Not on file   Social Determinants of Health   Financial Resource Strain:   . Difficulty of Paying Living Expenses:   Food Insecurity:   . Worried About Charity fundraiser in the Last Year:   . Arboriculturist in the Last Year:   Transportation Needs:   . Film/video editor (Medical):   Marland Kitchen Lack of Transportation (Non-Medical):   Physical Activity:   . Days of Exercise per Week:   . Minutes of Exercise per Session:   Stress:   . Feeling of Stress :   Social Connections:   . Frequency of Communication with Friends and Family:   . Frequency of Social Gatherings with Friends and Family:   . Attends Religious Services:   . Active Member of Clubs or Organizations:   . Attends Archivist Meetings:   Marland Kitchen Marital Status:   Intimate Partner Violence:   . Fear of Current or Ex-Partner:   . Emotionally Abused:   Marland Kitchen Physically Abused:   . Sexually Abused:     FAMILY HISTORY: Family History  Problem Relation Age of Onset  . Breast cancer  Other 42    ALLERGIES:  has No Known Allergies.  MEDICATIONS:  Current Outpatient Medications  Medication Sig Dispense Refill  . dexamethasone (DECADRON) 4 MG tablet Take 1 tablet (4 mg total) by mouth 2 (two) times daily. Start taking 2 days prior to each infusion for a total for 2 days. 30 tablet 1  . HYDROcodone-acetaminophen (NORCO/VICODIN) 5-325 MG tablet Take 1-2 tablets by mouth every 4 (four) hours as needed for moderate pain. (Patient not taking: Reported on 06/10/2019) 20 tablet 0  . levocetirizine (XYZAL) 5 MG tablet Take 5 mg by mouth every evening.    . lidocaine-prilocaine (EMLA) cream Apply 1 application topically as needed. 30 g 3  . losartan (COZAAR) 50 MG tablet Take 50 mg by mouth daily.    . montelukast (SINGULAIR) 10 MG tablet Take 1 tablet (10 mg total) by mouth at bedtime. 30 tablet 3  . ondansetron (ZOFRAN ODT) 4 MG disintegrating tablet Take 1 tablet (4 mg total) by mouth every 8 (eight) hours as needed for nausea or vomiting. (Patient not taking: Reported  on 06/16/2019) 20 tablet 0  . pantoprazole (PROTONIX) 20 MG tablet Take 20 mg by mouth daily.    Marland Kitchen senna-docusate (SENOKOT-S) 8.6-50 MG tablet Take 1 tablet by mouth 2 (two) times daily. (Patient not taking: Reported on 07/11/2019) 120 tablet 0  . traMADol (ULTRAM) 50 MG tablet Take 1 tablet (50 mg total) by mouth every 6 (six) hours as needed. 30 tablet 0   No current facility-administered medications for this visit.      Marland Kitchen  PHYSICAL EXAMINATION: ECOG PERFORMANCE STATUS: 0 - Asymptomatic  Vitals:   07/25/19 0839  BP: (!) 156/96  Pulse: (!) 57  Temp: (!) 97.1 F (36.2 C)   Filed Weights   07/25/19 0839  Weight: 140 lb 8 oz (63.7 kg)    Physical Exam  Constitutional: She is oriented to person, place, and time and well-developed, well-nourished, and in no distress.  HENT:  Head: Normocephalic and atraumatic.  Mouth/Throat: Oropharynx is clear and moist. No oropharyngeal exudate.  Eyes: Pupils are  equal, round, and reactive to light.  Cardiovascular: Normal rate and regular rhythm.  Pulmonary/Chest: Effort normal and breath sounds normal. No respiratory distress. She has no wheezes.  Abdominal: Soft. Bowel sounds are normal. She exhibits no distension and no mass. There is no abdominal tenderness. There is no rebound and no guarding.  Musculoskeletal:        General: No tenderness or edema. Normal range of motion.     Cervical back: Normal range of motion and neck supple.     Comments: Mild swelling of the left upper extremity; tenderness on deep palpation.  Neurological: She is alert and oriented to person, place, and time.  Skin: Skin is warm.  Psychiatric: Affect normal.     LABORATORY DATA:  I have reviewed the data as listed Lab Results  Component Value Date   WBC 18.0 (H) 07/25/2019   HGB 12.9 07/25/2019   HCT 37.4 07/25/2019   MCV 94.0 07/25/2019   PLT 269 07/25/2019   Recent Labs    05/07/19 1625 05/07/19 1625 07/11/19 0816 07/18/19 0839 07/25/19 0813  NA 140   < > 138 137 136  K 3.6   < > 3.8 3.9 3.7  CL 104   < > 104 102 102  CO2 28   < > 25 24 22   GLUCOSE 119*   < > 136* 123* 131*  BUN 15   < > 18 18 18   CREATININE 0.84   < > 0.67 0.59 0.64  CALCIUM 9.3   < > 9.2 9.0 8.9  GFRNONAA >60   < > >60 >60 >60  GFRAA >60   < > >60 >60 >60  PROT 8.2*  --  8.2*  --  7.5  ALBUMIN 4.6  --  4.2  --  3.9  AST 16  --  23  --  31  ALT 15  --  22  --  73*  ALKPHOS 64  --  73  --  74  BILITOT 0.5  --  0.6  --  0.5   < > = values in this interval not displayed.    RADIOGRAPHIC STUDIES: I have personally reviewed the radiological images as listed and agreed with the findings in the report. US Venous Img Upper Uni Left (DVT)  Result Date: 07/12/2019 CLINICAL DATA:  Left upper extremity pain and edema for the past 5 days. History of lymphoma. Evaluate for DVT. EXAM: LEFT UPPER EXTREMITY VENOUS DOPPLER ULTRASOUND TECHNIQUE: Gray-scale sonography with  graded  compression, as well as color Doppler and duplex ultrasound were performed to evaluate the upper extremity deep venous system from the level of the subclavian vein and including the jugular, axillary, basilic, radial, ulnar and upper cephalic vein. Spectral Doppler was utilized to evaluate flow at rest and with distal augmentation maneuvers. COMPARISON:  None. FINDINGS: Contralateral Subclavian Vein: Respiratory phasicity is normal and symmetric with the symptomatic side. No evidence of thrombus. Normal compressibility. Internal Jugular Vein: No evidence of thrombus. Normal compressibility, respiratory phasicity and response to augmentation. Subclavian Vein: No evidence of thrombus. Normal compressibility, respiratory phasicity and response to augmentation. Axillary Vein: No evidence of thrombus. Normal compressibility, respiratory phasicity and response to augmentation. Cephalic Vein: No evidence of thrombus. Normal compressibility, respiratory phasicity and response to augmentation. Basilic Vein: No evidence of thrombus. Normal compressibility, respiratory phasicity and response to augmentation. Brachial Veins: No evidence of thrombus. Normal compressibility, respiratory phasicity and response to augmentation. Radial Veins: No evidence of thrombus. Normal compressibility, respiratory phasicity and response to augmentation. Ulnar Veins: No evidence of thrombus. Normal compressibility, respiratory phasicity and response to augmentation. Venous Reflux:  None visualized. Other Findings:  None visualized. IMPRESSION: No evidence of DVT within the left upper extremity. Electronically Signed   By: Sandi Mariscal M.D.   On: 07/12/2019 12:05   DG Chest Port 1 View  Result Date: 07/08/2019 CLINICAL DATA:  Port-A-Cath insertion. EXAM: PORTABLE CHEST 1 VIEW COMPARISON:  PET-CT 05/18/2019.  Portable chest 06/26/2018. FINDINGS: PowerPort catheter with tip over upper right atrium. Heart size stable. Low lung volumes. Mild  bilateral pulmonary infiltrates/edema. No pleural effusion or pneumothorax. No acute bony abnormality. Surgical clips right upper quadrant. IMPRESSION: 1.  PowerPort catheter with tip over upper right atrium. 2.  Lung volumes.  Mild bilateral pulmonary infiltrates/edema. Electronically Signed   By: Marcello Moores  Register   On: 07/08/2019 12:08   DG C-Arm 1-60 Min-No Report  Result Date: 07/08/2019 Fluoroscopy was utilized by the requesting physician.  No radiographic interpretation.    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 On Obi-Benda  #Hold Obi-Benda cycle 1 day 15 today. Labs today reviewed;  acceptable for treatment today-given the dysuria/flank pain [see below]  # RIGHT flank pain/ abodminal pain- ? Etiology.  Check UA/culture  # DISPOSITION:call pt's daughter [maria] for appts. # 4/28Kelli Churn; # 4 weeks- MD-labs- cbc/cmp--Benda-Gazyva; D-2-Benda- Dr.B  Addendum: UA negative for infection.  However, plan chemotherapy tomorrow given conflict with chemotherapy schedule.    All questions were answered. The patient knows to call the clinic with any problems, questions or concerns.    [name], MD 07/31/2019 11:43 PM

## 2019-07-25 NOTE — Progress Notes (Unsigned)
Ua

## 2019-07-26 LAB — URINE CULTURE: Culture: NO GROWTH

## 2019-07-27 ENCOUNTER — Inpatient Hospital Stay: Payer: Commercial Managed Care - PPO

## 2019-07-27 ENCOUNTER — Other Ambulatory Visit: Payer: Self-pay

## 2019-07-27 VITALS — BP 115/77 | HR 73 | Temp 97.2°F | Resp 18

## 2019-07-27 DIAGNOSIS — C8238 Follicular lymphoma grade IIIa, lymph nodes of multiple sites: Secondary | ICD-10-CM

## 2019-07-27 DIAGNOSIS — Z7189 Other specified counseling: Secondary | ICD-10-CM

## 2019-07-27 MED ORDER — ACETAMINOPHEN 325 MG PO TABS
650.0000 mg | ORAL_TABLET | Freq: Once | ORAL | Status: AC
  Administered 2019-07-27: 09:00:00 650 mg via ORAL
  Filled 2019-07-27: qty 2

## 2019-07-27 MED ORDER — SODIUM CHLORIDE 0.9% FLUSH
10.0000 mL | INTRAVENOUS | Status: DC | PRN
  Administered 2019-07-27: 10 mL
  Filled 2019-07-27: qty 10

## 2019-07-27 MED ORDER — DIPHENHYDRAMINE HCL 50 MG/ML IJ SOLN
50.0000 mg | Freq: Once | INTRAMUSCULAR | Status: AC
  Administered 2019-07-27: 09:00:00 50 mg via INTRAVENOUS
  Filled 2019-07-27: qty 1

## 2019-07-27 MED ORDER — SODIUM CHLORIDE 0.9 % IV SOLN
1000.0000 mg | Freq: Once | INTRAVENOUS | Status: AC
  Administered 2019-07-27: 1000 mg via INTRAVENOUS
  Filled 2019-07-27: qty 40

## 2019-07-27 MED ORDER — HEPARIN SOD (PORK) LOCK FLUSH 100 UNIT/ML IV SOLN
INTRAVENOUS | Status: AC
  Filled 2019-07-27: qty 5

## 2019-07-27 MED ORDER — HEPARIN SOD (PORK) LOCK FLUSH 100 UNIT/ML IV SOLN
500.0000 [IU] | Freq: Once | INTRAVENOUS | Status: AC | PRN
  Administered 2019-07-27: 14:00:00 500 [IU]
  Filled 2019-07-27: qty 5

## 2019-07-27 MED ORDER — SODIUM CHLORIDE 0.9 % IV SOLN
10.0000 mg | Freq: Once | INTRAVENOUS | Status: AC
  Administered 2019-07-27: 09:00:00 10 mg via INTRAVENOUS
  Filled 2019-07-27: qty 10

## 2019-07-27 MED ORDER — SODIUM CHLORIDE 0.9 % IV SOLN
Freq: Once | INTRAVENOUS | Status: AC
  Filled 2019-07-27: qty 250

## 2019-07-27 NOTE — Progress Notes (Signed)
Clarified treatment plan orders with MD, Dr. Rogue Bussing. Per MD, Dr. Rogue Bussing, secure chat message: patient to receive [name] only today, no Bend[place]a.

## 2019-08-15 NOTE — Progress Notes (Signed)
Pharmacist Chemotherapy Monitoring - Follow Up Assessment    I verify that I have reviewed each item in the below checklist:  . Regimen for the patient is scheduled for the appropriate day and plan matches scheduled date. Marland Kitchen Appropriate non-routine labs are ordered dependent on drug ordered. . If applicable, additional medications reviewed and ordered per protocol based on lifetime cumulative doses and/or treatment regimen.   Plan for follow-up and/or issues identified: No . I-vent associated with next due treatment: No . MD and/or nursing notified: No  Fernie Grimm K 08/15/2019 8:27 AM

## 2019-08-17 NOTE — Progress Notes (Signed)
Pharmacist Chemotherapy Monitoring - Follow Up Assessment    I verify that I have reviewed each item in the below checklist:  . Regimen for the patient is scheduled for the appropriate day and plan matches scheduled date. Marland Kitchen Appropriate non-routine labs are ordered dependent on drug ordered. . If applicable, additional medications reviewed and ordered per protocol based on lifetime cumulative doses and/or treatment regimen.   Plan for follow-up and/or issues identified: No . I-vent associated with next due treatment: No . MD and/or nursing notified: No  Kim Contreras K 08/17/2019 9:06 AM

## 2019-08-22 ENCOUNTER — Inpatient Hospital Stay: Payer: Commercial Managed Care - PPO | Attending: Internal Medicine

## 2019-08-22 ENCOUNTER — Inpatient Hospital Stay: Payer: Commercial Managed Care - PPO

## 2019-08-22 ENCOUNTER — Encounter: Payer: Self-pay | Admitting: Internal Medicine

## 2019-08-22 ENCOUNTER — Other Ambulatory Visit: Payer: Self-pay

## 2019-08-22 ENCOUNTER — Inpatient Hospital Stay: Payer: Commercial Managed Care - PPO | Admitting: Internal Medicine

## 2019-08-22 VITALS — BP 130/85 | HR 63 | Temp 98.0°F | Resp 18

## 2019-08-22 DIAGNOSIS — C8238 Follicular lymphoma grade IIIa, lymph nodes of multiple sites: Secondary | ICD-10-CM | POA: Insufficient documentation

## 2019-08-22 DIAGNOSIS — M1990 Unspecified osteoarthritis, unspecified site: Secondary | ICD-10-CM | POA: Insufficient documentation

## 2019-08-22 DIAGNOSIS — I10 Essential (primary) hypertension: Secondary | ICD-10-CM | POA: Insufficient documentation

## 2019-08-22 DIAGNOSIS — R5383 Other fatigue: Secondary | ICD-10-CM | POA: Diagnosis not present

## 2019-08-22 DIAGNOSIS — R1031 Right lower quadrant pain: Secondary | ICD-10-CM | POA: Insufficient documentation

## 2019-08-22 DIAGNOSIS — R5381 Other malaise: Secondary | ICD-10-CM | POA: Insufficient documentation

## 2019-08-22 DIAGNOSIS — Z7952 Long term (current) use of systemic steroids: Secondary | ICD-10-CM | POA: Insufficient documentation

## 2019-08-22 DIAGNOSIS — Z5111 Encounter for antineoplastic chemotherapy: Secondary | ICD-10-CM | POA: Insufficient documentation

## 2019-08-22 DIAGNOSIS — Z7189 Other specified counseling: Secondary | ICD-10-CM

## 2019-08-22 DIAGNOSIS — Z95828 Presence of other vascular implants and grafts: Secondary | ICD-10-CM

## 2019-08-22 DIAGNOSIS — C8208 Follicular lymphoma grade I, lymph nodes of multiple sites: Secondary | ICD-10-CM

## 2019-08-22 DIAGNOSIS — Z79899 Other long term (current) drug therapy: Secondary | ICD-10-CM | POA: Insufficient documentation

## 2019-08-22 LAB — CBC WITH DIFFERENTIAL/PLATELET
Abs Immature Granulocytes: 0.08 10*3/uL — ABNORMAL HIGH (ref 0.00–0.07)
Basophils Absolute: 0 10*3/uL (ref 0.0–0.1)
Basophils Relative: 0 %
Eosinophils Absolute: 0 10*3/uL (ref 0.0–0.5)
Eosinophils Relative: 0 %
HCT: 34.8 % — ABNORMAL LOW (ref 36.0–46.0)
Hemoglobin: 11.9 g/dL — ABNORMAL LOW (ref 12.0–15.0)
Immature Granulocytes: 1 %
Lymphocytes Relative: 9 %
Lymphs Abs: 0.7 10*3/uL (ref 0.7–4.0)
MCH: 32.1 pg (ref 26.0–34.0)
MCHC: 34.2 g/dL (ref 30.0–36.0)
MCV: 93.8 fL (ref 80.0–100.0)
Monocytes Absolute: 0.9 10*3/uL (ref 0.1–1.0)
Monocytes Relative: 11 %
Neutro Abs: 6.5 10*3/uL (ref 1.7–7.7)
Neutrophils Relative %: 79 %
Platelets: 403 10*3/uL — ABNORMAL HIGH (ref 150–400)
RBC: 3.71 MIL/uL — ABNORMAL LOW (ref 3.87–5.11)
RDW: 14 % (ref 11.5–15.5)
WBC: 8.2 10*3/uL (ref 4.0–10.5)
nRBC: 0 % (ref 0.0–0.2)

## 2019-08-22 LAB — COMPREHENSIVE METABOLIC PANEL
ALT: 26 U/L (ref 0–44)
AST: 18 U/L (ref 15–41)
Albumin: 4 g/dL (ref 3.5–5.0)
Alkaline Phosphatase: 109 U/L (ref 38–126)
Anion gap: 10 (ref 5–15)
BUN: 19 mg/dL (ref 6–20)
CO2: 24 mmol/L (ref 22–32)
Calcium: 8.8 mg/dL — ABNORMAL LOW (ref 8.9–10.3)
Chloride: 105 mmol/L (ref 98–111)
Creatinine, Ser: 0.74 mg/dL (ref 0.44–1.00)
GFR calc Af Amer: >60 mL/min (ref >60)
GFR calc non Af Amer: >60 mL/min (ref >60)
Glucose, Bld: 133 mg/dL — ABNORMAL HIGH (ref 70–99)
Potassium: 3.8 mmol/L (ref 3.5–5.1)
Sodium: 139 mmol/L (ref 135–145)
Total Bilirubin: 0.5 mg/dL (ref 0.3–1.2)
Total Protein: 7.3 g/dL (ref 6.5–8.1)

## 2019-08-22 MED ORDER — PALONOSETRON HCL INJECTION 0.25 MG/5ML
0.2500 mg | Freq: Once | INTRAVENOUS | Status: AC
  Administered 2019-08-22: 0.25 mg via INTRAVENOUS
  Filled 2019-08-22: qty 5

## 2019-08-22 MED ORDER — SODIUM CHLORIDE 0.9 % IV SOLN
Freq: Once | INTRAVENOUS | Status: AC
  Filled 2019-08-22: qty 250

## 2019-08-22 MED ORDER — HEPARIN SOD (PORK) LOCK FLUSH 100 UNIT/ML IV SOLN
500.0000 [IU] | Freq: Once | INTRAVENOUS | Status: DC | PRN
  Filled 2019-08-22: qty 5

## 2019-08-22 MED ORDER — SODIUM CHLORIDE 0.9 % IV SOLN
1000.0000 mg | Freq: Once | INTRAVENOUS | Status: AC
  Administered 2019-08-22: 1000 mg via INTRAVENOUS
  Filled 2019-08-22: qty 40

## 2019-08-22 MED ORDER — SODIUM CHLORIDE 0.9 % IV SOLN
10.0000 mg | Freq: Once | INTRAVENOUS | Status: AC
  Administered 2019-08-22: 10 mg via INTRAVENOUS
  Filled 2019-08-22: qty 10

## 2019-08-22 MED ORDER — DIPHENHYDRAMINE HCL 50 MG/ML IJ SOLN
50.0000 mg | Freq: Once | INTRAMUSCULAR | Status: AC
  Administered 2019-08-22: 50 mg via INTRAVENOUS
  Filled 2019-08-22: qty 1

## 2019-08-22 MED ORDER — HEPARIN SOD (PORK) LOCK FLUSH 100 UNIT/ML IV SOLN
500.0000 [IU] | Freq: Once | INTRAVENOUS | Status: AC
  Administered 2019-08-22: 500 [IU] via INTRAVENOUS
  Filled 2019-08-22: qty 5

## 2019-08-22 MED ORDER — ACETAMINOPHEN 325 MG PO TABS
650.0000 mg | ORAL_TABLET | Freq: Once | ORAL | Status: AC
  Administered 2019-08-22: 650 mg via ORAL
  Filled 2019-08-22: qty 2

## 2019-08-22 MED ORDER — SODIUM CHLORIDE 0.9% FLUSH
10.0000 mL | Freq: Once | INTRAVENOUS | Status: AC
  Administered 2019-08-22: 10 mL via INTRAVENOUS
  Filled 2019-08-22: qty 10

## 2019-08-22 MED ORDER — SODIUM CHLORIDE 0.9 % IV SOLN
90.0000 mg/m2 | Freq: Once | INTRAVENOUS | Status: AC
  Administered 2019-08-22: 150 mg via INTRAVENOUS
  Filled 2019-08-22: qty 6

## 2019-08-22 NOTE — Progress Notes (Signed)
[place] NOTE  Patient Care Team: Center, [facility] as PCP - General (General Practice)  CHIEF COMPLAINTS/PURPOSE OF CONSULTATION: Lymphoma   Oncology History Overview Note  # 5th FEB 2021- ABDOMINAL MASS-  9.3 x 3.0 cm central mesenteric soft tissue mass is identified on image 38/series 2. 3.0x 3.0 cm collar of soft tissue surrounds branches of the superior mesenteric artery and vein on image 47/2 with relatively little mass-effect on the vascular anatomy. 2.0 x 1.5 cm nodular component of this abnormal soft tissue is identified in the mesentery of the right pelvis on 55/2. FEB 2021- PET-  PET scan-February 2021-SUV around 5.5 again highly suggestive of malignancy lymphoma.  Small cluster left supra pelvic lymph node/small retroperitoneal lymph nodes-5 mm-Douville score 3.   # MARCH [id number]- grade 3A follicular lymphoma [Open biopsy Dr. 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 ([place])   07/11/2019 -  Chemotherapy   The patient had palonosetron (ALOXI) injection 0.25 mg, 0.25 mg, Intravenous,  Once, 2 of 6 cycles Administration: 0.25 mg (07/11/2019) bendamustine (BENDEKA) 150 mg in sodium chloride 0.9 % 50 mL (2.6786 mg/mL) chemo infusion, 90 mg/m2 = 150 mg, Intravenous,  Once, 2 of 6 cycles Administration: 150 mg (07/11/2019), 150 mg (07/12/2019) obinutuzumab (GAZYVA) 1,[name], 1,000 mg, Intravenous, Once, 2 of 6 cycles Administration: 1,000 mg (07/11/2019), 1,000 mg (07/18/2019), 1,000 mg (07/27/2019)  for chemotherapy treatment.       HISTORY OF PRESENTING ILLNESS: Patient speaks minimal English/patient daughter [name] in office.  Wendee Copp 59 y.o.  female with newly  diagnosed follicle lymphoma grade 3 is here to proceed with cycle #2 of Gazyva.  Patient denies any nausea vomiting but appetite is good.  She continues to complain of right lower quadrant pain radiating to the back.  Going on for the last many months not any worse.   Review of Systems  Constitutional: Positive for malaise/fatigue. Negative for chills, diaphoresis, fever and weight loss.  HENT: Negative for nosebleeds and sore throat.   Eyes: Negative for double vision.  Respiratory: Negative for cough, hemoptysis, sputum production, shortness of breath and wheezing.   Cardiovascular: Negative for chest pain, palpitations, orthopnea and leg swelling.  Gastrointestinal: Positive for abdominal pain. Negative for blood in stool, constipation, diarrhea, heartburn, melena and vomiting.  Genitourinary: Negative for dysuria, frequency and urgency.  Musculoskeletal: Positive for back pain and joint pain.  Skin: Negative.  Negative for itching and rash.  Neurological: Positive for tingling (in hands x 2 months) and headaches. Negative for dizziness, focal weakness and weakness.  Endo/Heme/Allergies: Does not bruise/bleed easily.  Psychiatric/Behavioral: Negative for depression. The patient is not nervous/anxious and does not have insomnia.      MEDICAL HISTORY:  Past Medical History:  Diagnosis Date  . Anemia 2006  . Anxiety   . Arthritis   . Helicobacter pylori gastritis   . History of hiatal hernia   . Hypertension   . Lymphoma ([place])     SURGICAL HISTORY: Past Surgical History:  Procedure Laterality Date  . APPENDECTOMY    . CHOLECYSTECTOMY N/A 01/19/2015   Procedure: LAPAROSCOPIC CHOLECYSTECTOMY;  [name], MD;  [name];  Service: General;  [name];  . COLONOSCOPY WITH PROPOFOL N/A 10/27/2014   Procedure: COLONOSCOPY WITH PROPOFOL;  [name], MD;  Location: [facility] ENDOSCOPY;  Service: Endoscopy;  [name];  .  ESOPHAGOGASTRODUODENOSCOPY    . EXCISION MASS ABDOMINAL N/A 06/03/2019   Procedure: EXCISION MASS ABDOMINAL;  [name], MD;  [name];  Service: General;  [name];  abdominal node biopsy  . NASAL SINUS SURGERY    . PORTACATH PLACEMENT Left 07/08/2019   Procedure: INSERTION PORT-A-CATH;  [name], MD;  [name];  Service: General;  Laterality: Left;  . TUBAL LIGATION      SOCIAL HISTORY: Social History   Socioeconomic History  . Marital status: Married    Spouse name: Not on file  . Number of children: Not on file  . Years of education: Not on file  . Highest education level: Not on file  Occupational History  . Not on file  Tobacco Use  . Smoking status: Never Smoker  . Smokeless tobacco: Never Used  Substance and Sexual Activity  . Alcohol use: No  . Drug use: No  . Sexual activity: Not on file  Other Topics Concern  . Not on file  Social History Narrative  . Not on file   Social Determinants of Health   Financial Resource Strain:   . Difficulty of Paying Living Expenses:   Food Insecurity:   . Worried About Charity fundraiser in the Last Year:   . Arboriculturist in the Last Year:   Transportation Needs:   . Film/video editor (Medical):   Marland Kitchen Lack of Transportation (Non-Medical):   Physical Activity:   . Days of Exercise per Week:   . Minutes of Exercise per Session:   Stress:   . Feeling of Stress :   Social Connections:   . Frequency of Communication with Friends and Family:   . Frequency of Social Gatherings with Friends and Family:   . Attends Religious Services:   . Active Member of Clubs or Organizations:   . Attends Archivist Meetings:   Marland Kitchen Marital Status:   Intimate Partner Violence:   . Fear of Current or Ex-Partner:   . Emotionally Abused:   Marland Kitchen Physically Abused:   . Sexually Abused:     FAMILY HISTORY: Family History  Problem Relation Age of Onset  . Breast cancer  Other 42    ALLERGIES:  has No Known Allergies.  MEDICATIONS:  Current Outpatient Medications  Medication Sig Dispense Refill  . dexamethasone (DECADRON) 4 MG tablet Take 1 tablet (4 mg total) by mouth 2 (two) times daily. Start taking 2 days prior to each infusion for a total for 2 days. 30 tablet 1  . levocetirizine (XYZAL) 5 MG tablet Take 5 mg by mouth every evening.    . lidocaine-prilocaine (EMLA) cream Apply 1 application topically as needed. 30 g 3  . losartan (COZAAR) 50 MG tablet Take 50 mg by mouth daily.    . montelukast (SINGULAIR) 10 MG tablet Take 1 tablet (10 mg total) by mouth at bedtime. 30 tablet 3  . pantoprazole (PROTONIX) 20 MG tablet Take 20 mg by mouth daily.    Marland Kitchen HYDROcodone-acetaminophen (NORCO/VICODIN) 5-325 MG tablet Take 1-2 tablets by mouth every 4 (four) hours as needed for moderate pain. (Patient not taking: Reported on 06/10/2019) 20 tablet 0  . ondansetron (ZOFRAN ODT) 4 MG disintegrating tablet Take 1 tablet (4 mg total) by  mouth every 8 (eight) hours as needed for nausea or vomiting. (Patient not taking: Reported on 06/16/2019) 20 tablet 0  . senna-docusate (SENOKOT-S) 8.6-50 MG tablet Take 1 tablet by mouth 2 (two) times daily. (Patient not taking: Reported on 07/11/2019) 120 tablet 0  . traMADol (ULTRAM) 50 MG tablet Take 1 tablet (50 mg total) by mouth every 6 (six) hours as needed. (Patient not taking: Reported on 08/22/2019) 30 tablet 0   No current facility-administered medications for this visit.   Facility-Administered Medications Ordered in Other Visits  Medication Dose Route Frequency Provider Last Rate Last Admin  . bendamustine (BENDEKA) 150 mg in sodium chloride 0.9 % 50 mL (2.6786 mg/mL) chemo infusion  90 mg/m2 (Treatment Plan Recorded) Intravenous Once Charlaine Dalton R, MD      . heparin lock flush 100 unit/mL  500 Units Intravenous Once Charlaine Dalton R, MD      . heparin lock flush 100 unit/mL  500 Units Intracatheter Once PRN  [name], MD          .  PHYSICAL EXAMINATION: ECOG PERFORMANCE STATUS: 0 - Asymptomatic  Vitals:   08/22/19 0848  BP: (!) 144/97  Pulse: 63  Resp: 18  Temp: (!) 96.7 F (35.9 C)  SpO2: 99%   Filed Weights   08/22/19 0848  Weight: 143 lb 3.2 oz (65 kg)    Physical Exam  Constitutional: She is oriented to person, place, and time and well-developed, well-nourished, and in no distress.  HENT:  Head: Normocephalic and atraumatic.  Mouth/Throat: Oropharynx is clear and moist. No oropharyngeal exudate.  Eyes: Pupils are equal, round, and reactive to light.  Cardiovascular: Normal rate and regular rhythm.  Pulmonary/Chest: Effort normal and breath sounds normal. No respiratory distress. She has no wheezes.  Abdominal: Soft. Bowel sounds are normal. She exhibits no distension and no mass. There is no abdominal tenderness. There is no rebound and no guarding.  Musculoskeletal:        General: No tenderness or edema. Normal range of motion.     Cervical back: Normal range of motion and neck supple.     Comments: Mild swelling of the left upper extremity; tenderness on deep palpation.  Neurological: She is alert and oriented to person, place, and time.  Skin: Skin is warm.  Psychiatric: Affect normal.     LABORATORY DATA:  I have reviewed the data as listed Lab Results  Component Value Date   WBC 8.2 08/22/2019   HGB 11.9 (L) 08/22/2019   HCT 34.8 (L) 08/22/2019   MCV 93.8 08/22/2019   PLT 403 (H) 08/22/2019   Recent Labs    07/11/19 0816 07/11/19 0816 07/18/19 0839 07/25/19 0813 08/22/19 0813  NA 138   < > 137 136 139  K 3.8   < > 3.9 3.7 3.8  CL 104   < > 102 102 105  CO2 25   < > 24 22 24   GLUCOSE 136*   < > 123* 131* 133*  BUN 18   < > 18 18 19   CREATININE 0.67   < > 0.59 0.64 0.74  CALCIUM 9.2   < > 9.0 8.9 8.8*  GFRNONAA >60   < > >60 >60 >60  GFRAA >60   < > >60 >60 >60  PROT 8.2*  --   --  7.5 7.3  ALBUMIN 4.2  --   --  3.9 4.0  AST 23   --   --  31 18  ALT  22  --   --  73* 26  ALKPHOS 73  --   --  74 109  BILITOT 0.6  --   --  0.5 0.5   < > = values in this interval not displayed.    RADIOGRAPHIC STUDIES: I have personally reviewed the radiological images as listed and agreed with the findings in the report. No results found.  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 On Obi-Benda  #Proceed with Obi-Benda today# 2 today.   # RIGHT flank pain/ abodminal pain- ? Etiology-lymphoma versus others.  Monitor for now.   #Patient okay to go back to work within restrictions;   # DISPOSITION:call pt's daughter [maria] for appts. # today Benda-Gazyva; Oneal Grout- 5/26 # june 24th- MD-labs- cbc/cmp--Benda-Gazyva; D-2-Benda- Dr.B  All questions were answered. The patient knows to call the clinic with any problems, questions or concerns.    [name], MD 08/22/2019 11:56 AM

## 2019-08-22 NOTE — Progress Notes (Signed)
Pt in for follow up, states still having 'same pain in back down to leg on right side".  States does not having any hydrocodone left and is not taking tramadol.

## 2019-08-22 NOTE — Assessment & Plan Note (Addendum)
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 On Obi-Benda  #Proceed with Obi-Benda today# 2 today.   # RIGHT flank pain/ abodminal pain- ? Etiology-lymphoma versus others.  Monitor for now.   #Patient okay to go back to work within restrictions;   # DISPOSITION:call pt's daughter [maria] for appts. # today Benda-Gazyva; Oneal Grout- 5/26 # june 24th- MD-labs- cbc/cmp--Benda-Gazyva; D-2-Benda- Dr.B

## 2019-08-24 ENCOUNTER — Inpatient Hospital Stay: Payer: Commercial Managed Care - PPO

## 2019-08-24 ENCOUNTER — Other Ambulatory Visit: Payer: Self-pay

## 2019-08-24 VITALS — BP 116/80 | HR 61 | Temp 96.0°F | Resp 18

## 2019-08-24 DIAGNOSIS — Z7189 Other specified counseling: Secondary | ICD-10-CM

## 2019-08-24 DIAGNOSIS — C8238 Follicular lymphoma grade IIIa, lymph nodes of multiple sites: Secondary | ICD-10-CM | POA: Diagnosis not present

## 2019-08-24 MED ORDER — SODIUM CHLORIDE 0.9 % IV SOLN
10.0000 mg | Freq: Once | INTRAVENOUS | Status: AC
  Administered 2019-08-24: 10 mg via INTRAVENOUS
  Filled 2019-08-24: qty 10

## 2019-08-24 MED ORDER — HEPARIN SOD (PORK) LOCK FLUSH 100 UNIT/ML IV SOLN
INTRAVENOUS | Status: AC
  Filled 2019-08-24: qty 5

## 2019-08-24 MED ORDER — SODIUM CHLORIDE 0.9 % IV SOLN
Freq: Once | INTRAVENOUS | Status: AC
  Filled 2019-08-24: qty 250

## 2019-08-24 MED ORDER — SODIUM CHLORIDE 0.9 % IV SOLN
90.0000 mg/m2 | Freq: Once | INTRAVENOUS | Status: AC
  Administered 2019-08-24: 150 mg via INTRAVENOUS
  Filled 2019-08-24: qty 6

## 2019-08-24 MED ORDER — SODIUM CHLORIDE 0.9% FLUSH
10.0000 mL | INTRAVENOUS | Status: DC | PRN
  Filled 2019-08-24: qty 10

## 2019-08-24 MED ORDER — HEPARIN SOD (PORK) LOCK FLUSH 100 UNIT/ML IV SOLN
500.0000 [IU] | Freq: Once | INTRAVENOUS | Status: AC | PRN
  Administered 2019-08-24: 500 [IU]
  Filled 2019-08-24: qty 5

## 2019-09-22 ENCOUNTER — Inpatient Hospital Stay: Payer: Commercial Managed Care - PPO

## 2019-09-22 ENCOUNTER — Encounter: Payer: Self-pay | Admitting: *Deleted

## 2019-09-22 ENCOUNTER — Other Ambulatory Visit: Payer: Self-pay

## 2019-09-22 ENCOUNTER — Inpatient Hospital Stay: Payer: Commercial Managed Care - PPO | Attending: Internal Medicine

## 2019-09-22 ENCOUNTER — Inpatient Hospital Stay: Payer: Commercial Managed Care - PPO | Admitting: Internal Medicine

## 2019-09-22 ENCOUNTER — Encounter: Payer: Self-pay | Admitting: Internal Medicine

## 2019-09-22 VITALS — BP 123/61 | HR 67 | Temp 98.7°F | Resp 16

## 2019-09-22 DIAGNOSIS — C8238 Follicular lymphoma grade IIIa, lymph nodes of multiple sites: Secondary | ICD-10-CM

## 2019-09-22 DIAGNOSIS — Z79899 Other long term (current) drug therapy: Secondary | ICD-10-CM | POA: Insufficient documentation

## 2019-09-22 DIAGNOSIS — Z5112 Encounter for antineoplastic immunotherapy: Secondary | ICD-10-CM | POA: Insufficient documentation

## 2019-09-22 DIAGNOSIS — R5381 Other malaise: Secondary | ICD-10-CM | POA: Diagnosis not present

## 2019-09-22 DIAGNOSIS — R5383 Other fatigue: Secondary | ICD-10-CM | POA: Insufficient documentation

## 2019-09-22 DIAGNOSIS — M1990 Unspecified osteoarthritis, unspecified site: Secondary | ICD-10-CM | POA: Diagnosis not present

## 2019-09-22 DIAGNOSIS — I10 Essential (primary) hypertension: Secondary | ICD-10-CM | POA: Diagnosis not present

## 2019-09-22 DIAGNOSIS — Z5111 Encounter for antineoplastic chemotherapy: Secondary | ICD-10-CM | POA: Diagnosis not present

## 2019-09-22 DIAGNOSIS — Z7952 Long term (current) use of systemic steroids: Secondary | ICD-10-CM | POA: Insufficient documentation

## 2019-09-22 DIAGNOSIS — R1030 Lower abdominal pain, unspecified: Secondary | ICD-10-CM | POA: Insufficient documentation

## 2019-09-22 DIAGNOSIS — Z7189 Other specified counseling: Secondary | ICD-10-CM

## 2019-09-22 LAB — COMPREHENSIVE METABOLIC PANEL
ALT: 28 U/L (ref 0–44)
AST: 23 U/L (ref 15–41)
Albumin: 4.2 g/dL (ref 3.5–5.0)
Alkaline Phosphatase: 81 U/L (ref 38–126)
Anion gap: 10 (ref 5–15)
BUN: 17 mg/dL (ref 6–20)
CO2: 23 mmol/L (ref 22–32)
Calcium: 8.9 mg/dL (ref 8.9–10.3)
Chloride: 107 mmol/L (ref 98–111)
Creatinine, Ser: 0.66 mg/dL (ref 0.44–1.00)
GFR calc Af Amer: >60 mL/min (ref >60)
GFR calc non Af Amer: >60 mL/min (ref >60)
Glucose, Bld: 106 mg/dL — ABNORMAL HIGH (ref 70–99)
Potassium: 3.8 mmol/L (ref 3.5–5.1)
Sodium: 140 mmol/L (ref 135–145)
Total Bilirubin: 0.7 mg/dL (ref 0.3–1.2)
Total Protein: 7.4 g/dL (ref 6.5–8.1)

## 2019-09-22 LAB — CBC WITH DIFFERENTIAL/PLATELET
Abs Immature Granulocytes: 0.02 10*3/uL (ref 0.00–0.07)
Basophils Absolute: 0 10*3/uL (ref 0.0–0.1)
Basophils Relative: 0 %
Eosinophils Absolute: 0 10*3/uL (ref 0.0–0.5)
Eosinophils Relative: 0 %
HCT: 36.5 % (ref 36.0–46.0)
Hemoglobin: 12.5 g/dL (ref 12.0–15.0)
Immature Granulocytes: 0 %
Lymphocytes Relative: 7 %
Lymphs Abs: 0.5 10*3/uL — ABNORMAL LOW (ref 0.7–4.0)
MCH: 32.4 pg (ref 26.0–34.0)
MCHC: 34.2 g/dL (ref 30.0–36.0)
MCV: 94.6 fL (ref 80.0–100.0)
Monocytes Absolute: 0.7 10*3/uL (ref 0.1–1.0)
Monocytes Relative: 11 %
Neutro Abs: 5.7 10*3/uL (ref 1.7–7.7)
Neutrophils Relative %: 82 %
Platelets: 253 10*3/uL (ref 150–400)
RBC: 3.86 MIL/uL — ABNORMAL LOW (ref 3.87–5.11)
RDW: 14.4 % (ref 11.5–15.5)
WBC: 7 10*3/uL (ref 4.0–10.5)
nRBC: 0 % (ref 0.0–0.2)

## 2019-09-22 MED ORDER — PALONOSETRON HCL INJECTION 0.25 MG/5ML
0.2500 mg | Freq: Once | INTRAVENOUS | Status: AC
  Administered 2019-09-22: 0.25 mg via INTRAVENOUS
  Filled 2019-09-22: qty 5

## 2019-09-22 MED ORDER — SODIUM CHLORIDE 0.9 % IV SOLN
1000.0000 mg | Freq: Once | INTRAVENOUS | Status: AC
  Administered 2019-09-22: 1000 mg via INTRAVENOUS
  Filled 2019-09-22: qty 40

## 2019-09-22 MED ORDER — SODIUM CHLORIDE 0.9 % IV SOLN
90.0000 mg/m2 | Freq: Once | INTRAVENOUS | Status: AC
  Administered 2019-09-22: 150 mg via INTRAVENOUS
  Filled 2019-09-22: qty 6

## 2019-09-22 MED ORDER — TRAMADOL HCL 50 MG PO TABS: 50.0000 mg | ORAL_TABLET | Freq: Two times a day (BID) | ORAL | 0 refills | Status: DC | PRN

## 2019-09-22 MED ORDER — HEPARIN SOD (PORK) LOCK FLUSH 100 UNIT/ML IV SOLN
INTRAVENOUS | Status: AC
  Filled 2019-09-22: qty 5

## 2019-09-22 MED ORDER — SODIUM CHLORIDE 0.9 % IV SOLN
10.0000 mg | Freq: Once | INTRAVENOUS | Status: AC
  Administered 2019-09-22: 10 mg via INTRAVENOUS
  Filled 2019-09-22: qty 10

## 2019-09-22 MED ORDER — SODIUM CHLORIDE 0.9 % IV SOLN
Freq: Once | INTRAVENOUS | Status: AC
  Filled 2019-09-22: qty 250

## 2019-09-22 MED ORDER — SENNOSIDES-DOCUSATE SODIUM 8.6-50 MG PO TABS: 1.0000 | ORAL_TABLET | Freq: Two times a day (BID) | ORAL | 0 refills | Status: DC

## 2019-09-22 MED ORDER — HEPARIN SOD (PORK) LOCK FLUSH 100 UNIT/ML IV SOLN
500.0000 [IU] | Freq: Once | INTRAVENOUS | Status: AC | PRN
  Administered 2019-09-22: 500 [IU]
  Filled 2019-09-22: qty 5

## 2019-09-22 MED ORDER — DIPHENHYDRAMINE HCL 50 MG/ML IJ SOLN
50.0000 mg | Freq: Once | INTRAMUSCULAR | Status: AC
  Administered 2019-09-22: 50 mg via INTRAVENOUS
  Filled 2019-09-22: qty 1

## 2019-09-22 MED ORDER — SODIUM CHLORIDE 0.9% FLUSH
10.0000 mL | Freq: Once | INTRAVENOUS | Status: AC
  Administered 2019-09-22: 10 mL via INTRAVENOUS
  Filled 2019-09-22: qty 10

## 2019-09-22 MED ORDER — ACETAMINOPHEN 325 MG PO TABS
650.0000 mg | ORAL_TABLET | Freq: Once | ORAL | Status: AC
  Administered 2019-09-22: 650 mg via ORAL
  Filled 2019-09-22: qty 2

## 2019-09-22 NOTE — Progress Notes (Signed)
[place] NOTE  Patient Care Team: Center, [facility] as PCP - General (General Practice)  CHIEF COMPLAINTS/PURPOSE OF CONSULTATION: Lymphoma   Oncology History Overview Note  # 5th FEB 2021- ABDOMINAL MASS-  9.3 x 3.0 cm central mesenteric soft tissue mass is identified on image 38/series 2. 3.0x 3.0 cm collar of soft tissue surrounds branches of the superior mesenteric artery and vein on image 47/2 with relatively little mass-effect on the vascular anatomy. 2.0 x 1.5 cm nodular component of this abnormal soft tissue is identified in the mesentery of the right pelvis on 55/2. FEB 2021- PET-  PET scan-February 2021-SUV around 5.5 again highly suggestive of malignancy lymphoma.  Small cluster left supra pelvic lymph node/small retroperitoneal lymph nodes-5 mm-Douville score 3.   # MARCH 2440- grade 3A follicular lymphoma [Open biopsy Dr. 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 ([place])   07/11/2019 -  Chemotherapy   The patient had dexamethasone (DECADRON) 4 MG tablet, 8 mg, Oral, Daily, 1 of 1 cycle, Start date: --, [name], 0.25 mg, Intravenous,  Once, 3 of 6 cycles Administration: 0.25 mg (07/11/2019), 0.25 mg (08/22/2019) bendamustine (BENDEKA) 150 mg in sodium chloride 0.9 % 50 mL (2.6786 mg/mL) chemo infusion, 90 mg/m2 = 150 mg, Intravenous,  Once, 3 of 6 cycles Administration: 150 mg (07/11/2019), 150 mg (07/12/2019), 150 mg (08/22/2019), 150 mg (08/24/2019) obinutuzumab (GAZYVA) 1,[name], 1,000 mg, Intravenous, Once, 3 of 6 cycles Administration: 1,000 mg (07/11/2019), 1,000 mg (07/18/2019), 1,000 mg (08/22/2019), 1,000 mg  (07/27/2019)  for chemotherapy treatment.       HISTORY OF PRESENTING ILLNESS: Patient speaks minimal English/patient daughter Kim Contreras in office.  Kim Contreras 59 y.o.  female with follicular lymphoma grade 3 is here to proceed with cycle #3 of Gazyva.  Patient continues to complain of pain in the lower abdomen radiating to the right flank and also right thigh.  No nausea no vomiting. Complains of difficulty at work lifting heavy objects. Needs work restriction   Review of Systems  Constitutional: Positive for malaise/fatigue. Negative for chills, diaphoresis, fever and weight loss.  HENT: Negative for nosebleeds and sore throat.   Eyes: Negative for double vision.  Respiratory: Negative for cough, hemoptysis, sputum production, shortness of breath and wheezing.   Cardiovascular: Negative for chest pain, palpitations, orthopnea and leg swelling.  Gastrointestinal: Positive for abdominal pain. Negative for blood in stool, constipation, diarrhea, heartburn, melena and vomiting.  Genitourinary: Negative for dysuria, frequency and urgency.  Musculoskeletal: Positive for back pain and joint pain.  Skin: Negative.  Negative for itching and rash.  Neurological: Positive for tingling (in hands x 2 months) and headaches. Negative for dizziness, focal weakness and weakness.  Endo/Heme/Allergies: Does not bruise/bleed easily.  Psychiatric/Behavioral: Negative for depression. The patient is not nervous/anxious and does not have insomnia.      MEDICAL HISTORY:  Past Medical History:  Diagnosis Date  . Anemia 2006  . Anxiety   . Arthritis   . Helicobacter pylori gastritis   . History of hiatal hernia   . Hypertension   . Lymphoma ([place])     SURGICAL HISTORY: Past Surgical History:  Procedure Laterality  Date  . APPENDECTOMY    . CHOLECYSTECTOMY N/A 01/19/2015   Procedure: LAPAROSCOPIC CHOLECYSTECTOMY;  [name], MD;  [name];  Service: General;  Laterality:  N/A;  . COLONOSCOPY WITH PROPOFOL N/A 10/27/2014   Procedure: COLONOSCOPY WITH PROPOFOL;  [name], MD;  Location: [facility] ENDOSCOPY;  Service: Endoscopy;  [name];  . ESOPHAGOGASTRODUODENOSCOPY    . EXCISION MASS ABDOMINAL N/A 06/03/2019   Procedure: EXCISION MASS ABDOMINAL;  [name], MD;  [name];  Service: General;  [name];  abdominal node biopsy  . NASAL SINUS SURGERY    . PORTACATH PLACEMENT Left 07/08/2019   Procedure: INSERTION PORT-A-CATH;  [name], MD;  [name];  Service: General;  Laterality: Left;  . TUBAL LIGATION      SOCIAL HISTORY: Social History   Socioeconomic History  . Marital status: Married    Spouse name: Not on file  . Number of children: Not on file  . Years of education: Not on file  . Highest education level: Not on file  Occupational History  . Not on file  Tobacco Use  . Smoking status: Never Smoker  . Smokeless tobacco: Never Used  Substance and Sexual Activity  . Alcohol use: No  . Drug use: No  . Sexual activity: Not on file  Other Topics Concern  . Not on file  Social History Narrative  . Not on file   Social Determinants of Health   Financial Resource Strain:   . Difficulty of Paying Living Expenses:   Food Insecurity:   . Worried About Charity fundraiser in the Last Year:   . Arboriculturist in the Last Year:   Transportation Needs:   . Film/video editor (Medical):   Marland Kitchen Lack of Transportation (Non-Medical):   Physical Activity:   . Days of Exercise per Week:   . Minutes of Exercise per Session:   Stress:   . Feeling of Stress :   Social Connections:   . Frequency of Communication with Friends and Family:   . Frequency of Social Gatherings with Friends and Family:   . Attends Religious Services:   . Active Member of Clubs or Organizations:   . Attends Archivist Meetings:   Marland Kitchen Marital Status:   Intimate Partner Violence:   .  Fear of Current or Ex-Partner:   . Emotionally Abused:   Marland Kitchen Physically Abused:   . Sexually Abused:     FAMILY HISTORY: Family History  Problem Relation Age of Onset  . Breast cancer Other 42    ALLERGIES:  has No Known Allergies.  MEDICATIONS:  Current Outpatient Medications  Medication Sig Dispense Refill  . senna-docusate (SENOKOT-S) 8.6-50 MG tablet Take 1 tablet by mouth 2 (two) times daily. 120 tablet 0  . traMADol (ULTRAM) 50 MG tablet Take 1 tablet (50 mg total) by mouth every 12 (twelve) hours as needed. 60 tablet 0  . dexamethasone (DECADRON) 4 MG tablet Take 1 tablet (4 mg total) by mouth 2 (two) times daily. Start taking 2 days prior to each infusion for a total for 2 days. 30 tablet 1  . HYDROcodone-acetaminophen (NORCO/VICODIN) 5-325 MG tablet Take 1-2 tablets by mouth every 4 (four) hours as needed for moderate pain. (Patient not taking: Reported on 06/10/2019) 20 tablet 0  . levocetirizine (XYZAL) 5 MG tablet Take 5 mg by mouth every evening.    . lidocaine-prilocaine (EMLA) cream Apply 1  application topically as needed. 30 g 3  . losartan (COZAAR) 50 MG tablet Take 50 mg by mouth daily.    . montelukast (SINGULAIR) 10 MG tablet Take 1 tablet (10 mg total) by mouth at bedtime. 30 tablet 3  . ondansetron (ZOFRAN ODT) 4 MG disintegrating tablet Take 1 tablet (4 mg total) by mouth every 8 (eight) hours as needed for nausea or vomiting. (Patient not taking: Reported on 06/16/2019) 20 tablet 0  . pantoprazole (PROTONIX) 20 MG tablet Take 20 mg by mouth daily.     No current facility-administered medications for this visit.   Facility-Administered Medications Ordered in Other Visits  Medication Dose Route Frequency Provider Last Rate Last Admin  . bendamustine (BENDEKA) 150 mg in sodium chloride 0.9 % 50 mL (2.6786 mg/mL) chemo infusion  90 mg/m2 (Treatment Plan Recorded) Intravenous Once Charlaine Dalton R, MD      . heparin lock flush 100 unit/mL  500 Units Intracatheter  Once PRN [name], MD          .  PHYSICAL EXAMINATION: ECOG PERFORMANCE STATUS: 0 - Asymptomatic  Vitals:   09/22/19 0836  BP: (!) 153/86  Pulse: (!) 57  Resp: 20  Temp: (!) 96.9 F (36.1 C)   Filed Weights   09/22/19 0836  Weight: 143 lb (64.9 kg)    Physical Exam HENT:     Head: Normocephalic and atraumatic.     Mouth/Throat:     Pharynx: No oropharyngeal exudate.  Eyes:     Pupils: Pupils are equal, round, and reactive to light.  Cardiovascular:     Rate and Rhythm: Normal rate and regular rhythm.  Pulmonary:     Effort: Pulmonary effort is normal. No respiratory distress.     Breath sounds: Normal breath sounds. No wheezing.  Abdominal:     General: Bowel sounds are normal. There is no distension.     Palpations: Abdomen is soft. There is no mass.     Tenderness: There is no abdominal tenderness. There is no guarding or rebound.  Musculoskeletal:        General: No tenderness. Normal range of motion.     Cervical back: Normal range of motion and neck supple.     Comments: Mild swelling of the left upper extremity; tenderness on deep palpation.  Skin:    General: Skin is warm.  Neurological:     Mental Status: She is alert and oriented to person, place, and time.  Psychiatric:        Mood and Affect: Affect normal.      LABORATORY DATA:  I have reviewed the data as listed Lab Results  Component Value Date   WBC 7.0 09/22/2019   HGB 12.5 09/22/2019   HCT 36.5 09/22/2019   MCV 94.6 09/22/2019   PLT 253 09/22/2019   Recent Labs    07/25/19 0813 08/22/19 0813 09/22/19 0817  NA 136 139 140  K 3.7 3.8 3.8  CL 102 105 107  CO2 22 24 23   GLUCOSE 131* 133* 106*  BUN 18 19 17   CREATININE 0.64 0.74 0.66  CALCIUM 8.9 8.8* 8.9  GFRNONAA >60 >60 >60  GFRAA >60 >60 >60  PROT 7.5 7.3 7.4  ALBUMIN 3.9 4.0 4.2  AST 31 18 23   ALT 73* 26 28  ALKPHOS 74 109 81  BILITOT 0.5 0.5 0.7    RADIOGRAPHIC STUDIES: I have personally reviewed the  radiological images as listed and agreed with the findings in the report.  No results found.  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 On Obi-Benda- STABLE.   #Proceed with Obi-Benda today# 3 today. Labs today reviewed;  acceptable for treatment today. Plan PET after this cycle- given worsening abdominal symptoms [below]  # RIGHT flank pain/ abodminal pain- ? Etiology-lymphoma versus others- ? Constipation; recommend sennakot-will get PET in 2 weeks.    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 The patient knows to call the clinic with any problems, questions or concerns.    [name], MD 09/22/2019 2:27 PM

## 2019-09-22 NOTE — Assessment & Plan Note (Addendum)
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 On Obi-Benda- STABLE.   #Proceed with Obi-Benda today# 3 today. Labs today reviewed;  acceptable for treatment today. Plan PET after this cycle- given worsening abdominal symptoms [below]  # RIGHT flank pain/ abodminal pain- ? Etiology-lymphoma versus others- ? Constipation; recommend sennakot-will get PET in 2 weeks.    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

## 2019-09-23 ENCOUNTER — Inpatient Hospital Stay: Payer: Commercial Managed Care - PPO

## 2019-09-23 VITALS — BP 147/85 | HR 68 | Temp 97.0°F | Resp 18

## 2019-09-23 DIAGNOSIS — C8238 Follicular lymphoma grade IIIa, lymph nodes of multiple sites: Secondary | ICD-10-CM

## 2019-09-23 DIAGNOSIS — Z7189 Other specified counseling: Secondary | ICD-10-CM

## 2019-09-23 MED ORDER — SODIUM CHLORIDE 0.9 % IV SOLN
90.0000 mg/m2 | Freq: Once | INTRAVENOUS | Status: AC
  Administered 2019-09-23: 150 mg via INTRAVENOUS
  Filled 2019-09-23: qty 6

## 2019-09-23 MED ORDER — SODIUM CHLORIDE 0.9 % IV SOLN
Freq: Once | INTRAVENOUS | Status: AC
  Filled 2019-09-23: qty 250

## 2019-09-23 MED ORDER — SODIUM CHLORIDE 0.9 % IV SOLN
10.0000 mg | Freq: Once | INTRAVENOUS | Status: AC
  Administered 2019-09-23: 10 mg via INTRAVENOUS
  Filled 2019-09-23: qty 10

## 2019-09-23 MED ORDER — HEPARIN SOD (PORK) LOCK FLUSH 100 UNIT/ML IV SOLN
500.0000 [IU] | Freq: Once | INTRAVENOUS | Status: AC | PRN
  Administered 2019-09-23: 500 [IU]
  Filled 2019-09-23: qty 5

## 2019-10-05 ENCOUNTER — Ambulatory Visit
Admission: RE | Admit: 2019-10-05 | Discharge: 2019-10-05 | Disposition: A | Discharge status: Home / Self Care | Payer: Commercial Managed Care - PPO | Source: Ambulatory Visit | Attending: Internal Medicine | Admitting: Internal Medicine

## 2019-10-05 ENCOUNTER — Other Ambulatory Visit: Payer: Self-pay

## 2019-10-05 DIAGNOSIS — C8238 Follicular lymphoma grade IIIa, lymph nodes of multiple sites: Secondary | ICD-10-CM | POA: Insufficient documentation

## 2019-10-05 LAB — GLUCOSE, CAPILLARY: Glucose-Capillary: 96 mg/dL (ref 70–99)

## 2019-10-05 MED ORDER — FLUDEOXYGLUCOSE F - 18 (FDG) INJECTION
7.4000 | Freq: Once | INTRAVENOUS | Status: AC | PRN
  Administered 2019-10-05: 7.62 via INTRAVENOUS

## 2019-10-07 ENCOUNTER — Inpatient Hospital Stay: Payer: Commercial Managed Care - PPO | Admitting: Internal Medicine

## 2019-10-07 ENCOUNTER — Other Ambulatory Visit: Payer: Self-pay

## 2019-10-07 ENCOUNTER — Inpatient Hospital Stay: Payer: Commercial Managed Care - PPO | Attending: Internal Medicine

## 2019-10-07 DIAGNOSIS — R350 Frequency of micturition: Secondary | ICD-10-CM | POA: Diagnosis not present

## 2019-10-07 DIAGNOSIS — C8238 Follicular lymphoma grade IIIa, lymph nodes of multiple sites: Secondary | ICD-10-CM | POA: Insufficient documentation

## 2019-10-07 DIAGNOSIS — R300 Dysuria: Secondary | ICD-10-CM | POA: Insufficient documentation

## 2019-10-07 DIAGNOSIS — D709 Neutropenia, unspecified: Secondary | ICD-10-CM | POA: Insufficient documentation

## 2019-10-07 DIAGNOSIS — K5900 Constipation, unspecified: Secondary | ICD-10-CM | POA: Insufficient documentation

## 2019-10-07 DIAGNOSIS — R1031 Right lower quadrant pain: Secondary | ICD-10-CM | POA: Insufficient documentation

## 2019-10-07 LAB — CBC WITH DIFFERENTIAL/PLATELET
Abs Immature Granulocytes: 0 10*3/uL (ref 0.00–0.07)
Basophils Absolute: 0 10*3/uL (ref 0.0–0.1)
Basophils Relative: 1 %
Eosinophils Absolute: 0.1 10*3/uL (ref 0.0–0.5)
Eosinophils Relative: 5 %
HCT: 35.6 % — ABNORMAL LOW (ref 36.0–46.0)
Hemoglobin: 12.7 g/dL (ref 12.0–15.0)
Immature Granulocytes: 0 %
Lymphocytes Relative: 10 %
Lymphs Abs: 0.2 10*3/uL — ABNORMAL LOW (ref 0.7–4.0)
MCH: 33.3 pg (ref 26.0–34.0)
MCHC: 35.7 g/dL (ref 30.0–36.0)
MCV: 93.4 fL (ref 80.0–100.0)
Monocytes Absolute: 0.6 10*3/uL (ref 0.1–1.0)
Monocytes Relative: 26 %
Neutro Abs: 1.3 10*3/uL — ABNORMAL LOW (ref 1.7–7.7)
Neutrophils Relative %: 58 %
Platelets: 220 10*3/uL (ref 150–400)
RBC: 3.81 MIL/uL — ABNORMAL LOW (ref 3.87–5.11)
RDW: 14 % (ref 11.5–15.5)
WBC: 2.3 10*3/uL — ABNORMAL LOW (ref 4.0–10.5)
nRBC: 0 % (ref 0.0–0.2)

## 2019-10-07 LAB — COMPREHENSIVE METABOLIC PANEL
ALT: 29 U/L (ref 0–44)
AST: 27 U/L (ref 15–41)
Albumin: 4.1 g/dL (ref 3.5–5.0)
Alkaline Phosphatase: 88 U/L (ref 38–126)
Anion gap: 8 (ref 5–15)
BUN: 12 mg/dL (ref 6–20)
CO2: 26 mmol/L (ref 22–32)
Calcium: 9.2 mg/dL (ref 8.9–10.3)
Chloride: 103 mmol/L (ref 98–111)
Creatinine, Ser: 0.8 mg/dL (ref 0.44–1.00)
GFR calc Af Amer: >60 mL/min (ref >60)
GFR calc non Af Amer: >60 mL/min (ref >60)
Glucose, Bld: 103 mg/dL — ABNORMAL HIGH (ref 70–99)
Potassium: 4.8 mmol/L (ref 3.5–5.1)
Sodium: 137 mmol/L (ref 135–145)
Total Bilirubin: 0.5 mg/dL (ref 0.3–1.2)
Total Protein: 7.4 g/dL (ref 6.5–8.1)

## 2019-10-07 LAB — LACTATE DEHYDROGENASE: LDH: 175 U/L (ref 98–192)

## 2019-10-07 NOTE — Assessment & Plan Note (Addendum)
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 On Obi-benda.  October 04, 2019-PET scan improved response; with significant resolution of the abdominal/retroperitoneal lymph nodes.  #Proceed with Obi-Benda today# 4  In 2 weeks. Labs today reviewed;  acceptable for treatment today.  # RIGHT flank pain/ abodminal pain > 6 months; ? Constipation- 3 days; recommend miralax every day. [place]-2016- 19mm polyp [[place]].   # DISPOSITION:call pt's daughter [maria] for appts.; work letter  # follow upon 22 nd July- MD; labs- cbc/bmp;LDH; Obi-Benda; D-2 Oneal Grout- Dr.B  # I reviewed the blood work- with the patient in detail; also reviewed the imaging independently [as summarized above]; and with the patient in detail.

## 2019-10-07 NOTE — Progress Notes (Signed)
[place] NOTE  Patient Care Team: Center, [facility] as PCP - General (General Practice)  CHIEF COMPLAINTS/PURPOSE OF CONSULTATION: Lymphoma   Oncology History Overview Note  # 5th FEB 2021- ABDOMINAL MASS-  9.3 x 3.0 cm central mesenteric soft tissue mass is identified on image 38/series 2. 3.0x 3.0 cm collar of soft tissue surrounds branches of the superior mesenteric artery and vein on image 47/2 with relatively little mass-effect on the vascular anatomy. 2.0 x 1.5 cm nodular component of this abnormal soft tissue is identified in the mesentery of the right pelvis on 55/2. FEB 2021- PET-  PET scan-February 2021-SUV around 5.5 again highly suggestive of malignancy lymphoma.  Small cluster left supra pelvic lymph node/small retroperitoneal lymph nodes-5 mm-Douville score 3.   # MARCH 2836- grade 3A follicular lymphoma [Open biopsy Dr. 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 ([place])   07/11/2019 -  Chemotherapy   The patient had dexamethasone (DECADRON) 4 MG tablet, 8 mg, Oral, Daily, 1 of 1 cycle, Start date: --, [name], 0.25 mg, Intravenous,  Once, 3 of 6 cycles Administration: 0.25 mg (07/11/2019), 0.25 mg (08/22/2019), 0.25 mg (09/22/2019) bendamustine (BENDEKA) 150 mg in sodium chloride 0.9 % 50 mL (2.6786 mg/mL) chemo infusion, 90 mg/m2 = 150 mg, Intravenous,  Once, 3 of 6 cycles Administration: 150 mg (07/11/2019), 150 mg (07/12/2019), 150 mg (08/22/2019), 150 mg (08/24/2019), 150 mg (09/22/2019), 150 mg (09/23/2019) obinutuzumab (GAZYVA) 1,[name], 1,000 mg, Intravenous, Once, 3 of 6 cycles Administration: 1,000 mg (07/11/2019),  1,000 mg (07/18/2019), 1,000 mg (08/22/2019), 1,000 mg (07/27/2019), 1,000 mg (09/22/2019)  for chemotherapy treatment.       HISTORY OF PRESENTING ILLNESS: Patient speaks minimal English/patient daughter [name] in office.  Kim Contreras 59 y.o.  female with follicular lymphoma grade 3 currently s/p cycle #3 of Gazyva 2 weeks ago is here for follow-up.  Patient continues to have intermittent right lower quadrant abdominal pain radiating to the right flank.  Associated constipation.  No blood in stools or black or stools.  Had a colonoscopy 5 years ago.  Also complains of abdominal bloating.  Mild nausea no vomiting.  Review of Systems  Constitutional: Positive for malaise/fatigue. Negative for chills, diaphoresis, fever and weight loss.  HENT: Negative for nosebleeds and sore throat.   Eyes: Negative for double vision.  Respiratory: Negative for cough, hemoptysis, sputum production, shortness of breath and wheezing.   Cardiovascular: Negative for chest pain, palpitations, orthopnea and leg swelling.  Gastrointestinal: Positive for abdominal pain. Negative for blood in stool, constipation, diarrhea, heartburn, melena and vomiting.  Genitourinary: Negative for dysuria, frequency and urgency.  Musculoskeletal: Positive for back pain and joint pain.  Skin: Negative.  Negative for itching and rash.  Neurological: Positive for tingling (in hands x 2 months) and headaches. Negative for dizziness, focal weakness and weakness.  Endo/Heme/Allergies: Does not bruise/bleed easily.  Psychiatric/Behavioral: Negative for depression. The patient is not nervous/anxious and does not have insomnia.      MEDICAL HISTORY:  Past Medical History:  Diagnosis Date  . Anemia 2006  . Anxiety   . Arthritis   . Helicobacter pylori gastritis   .  History of hiatal hernia   . Hypertension   . Lymphoma ([place])     SURGICAL HISTORY: Past Surgical History:  Procedure Laterality Date  . APPENDECTOMY    .  CHOLECYSTECTOMY N/A 01/19/2015   Procedure: LAPAROSCOPIC CHOLECYSTECTOMY;  [name], MD;  [name];  Service: General;  [name];  . COLONOSCOPY WITH PROPOFOL N/A 10/27/2014   Procedure: COLONOSCOPY WITH PROPOFOL;  [name], MD;  Location: [facility] ENDOSCOPY;  Service: Endoscopy;  [name];  . ESOPHAGOGASTRODUODENOSCOPY    . EXCISION MASS ABDOMINAL N/A 06/03/2019   Procedure: EXCISION MASS ABDOMINAL;  [name], MD;  [name];  Service: General;  [name];  abdominal node biopsy  . NASAL SINUS SURGERY    . PORTACATH PLACEMENT Left 07/08/2019   Procedure: INSERTION PORT-A-CATH;  [name], MD;  [name];  Service: General;  Laterality: Left;  . TUBAL LIGATION      SOCIAL HISTORY: Social History   Socioeconomic History  . Marital status: Married    Spouse name: Not on file  . Number of children: Not on file  . Years of education: Not on file  . Highest education level: Not on file  Occupational History  . Not on file  Tobacco Use  . Smoking status: Never Smoker  . Smokeless tobacco: Never Used  Substance and Sexual Activity  . Alcohol use: No  . Drug use: No  . Sexual activity: Not on file  Other Topics Concern  . Not on file  Social History Narrative  . Not on file   Social Determinants of Health   Financial Resource Strain:   . Difficulty of Paying Living Expenses:   Food Insecurity:   . Worried About Charity fundraiser in the Last Year:   . Arboriculturist in the Last Year:   Transportation Needs:   . Film/video editor (Medical):   Marland Kitchen Lack of Transportation (Non-Medical):   Physical Activity:   . Days of Exercise per Week:   . Minutes of Exercise per Session:   Stress:   . Feeling of Stress :   Social Connections:   . Frequency of Communication with Friends and Family:   . Frequency of Social Gatherings with Friends and Family:   . Attends  Religious Services:   . Active Member of Clubs or Organizations:   . Attends Archivist Meetings:   Marland Kitchen Marital Status:   Intimate Partner Violence:   . Fear of Current or Ex-Partner:   . Emotionally Abused:   Marland Kitchen Physically Abused:   . Sexually Abused:     FAMILY HISTORY: Family History  Problem Relation Age of Onset  . Breast cancer Other 42    ALLERGIES:  has No Known Allergies.  MEDICATIONS:  Current Outpatient Medications  Medication Sig Dispense Refill  . dexamethasone (DECADRON) 4 MG tablet Take 1 tablet (4 mg total) by mouth 2 (two) times daily. Start taking 2 days prior to each infusion for a total for 2 days. 30 tablet 1  . HYDROcodone-acetaminophen (NORCO/VICODIN) 5-325 MG tablet Take 1-2 tablets by mouth every 4 (four) hours as needed for moderate pain. 20 tablet 0  . levocetirizine (XYZAL) 5 MG tablet Take 5 mg by mouth every evening.    . lidocaine-prilocaine (EMLA) cream Apply 1 application topically as needed. 30 g 3  . losartan (COZAAR) 50 MG tablet Take 50 mg by mouth daily.    Marland Kitchen  montelukast (SINGULAIR) 10 MG tablet Take 1 tablet (10 mg total) by mouth at bedtime. 30 tablet 3  . pantoprazole (PROTONIX) 20 MG tablet Take 20 mg by mouth daily.    Marland Kitchen senna-docusate (SENOKOT-S) 8.6-50 MG tablet Take 1 tablet by mouth 2 (two) times daily. 120 tablet 0  . traMADol (ULTRAM) 50 MG tablet Take 1 tablet (50 mg total) by mouth every 12 (twelve) hours as needed. 60 tablet 0  . ondansetron (ZOFRAN ODT) 4 MG disintegrating tablet Take 1 tablet (4 mg total) by mouth every 8 (eight) hours as needed for nausea or vomiting. (Patient not taking: Reported on 06/16/2019) 20 tablet 0   No current facility-administered medications for this visit.      Marland Kitchen  PHYSICAL EXAMINATION: ECOG PERFORMANCE STATUS: 0 - Asymptomatic  Vitals:   10/07/19 1516  BP: (!) 135/96  Pulse: 93  Resp: 16  Temp: 98.7 F (37.1 C)  SpO2: 100%   Filed Weights   10/07/19 1516  Weight: 143 lb  (64.9 kg)    Physical Exam HENT:     Head: Normocephalic and atraumatic.     Mouth/Throat:     Pharynx: No oropharyngeal exudate.  Eyes:     Pupils: Pupils are equal, round, and reactive to light.  Cardiovascular:     Rate and Rhythm: Normal rate and regular rhythm.  Pulmonary:     Effort: Pulmonary effort is normal. No respiratory distress.     Breath sounds: Normal breath sounds. No wheezing.  Abdominal:     General: Bowel sounds are normal. There is no distension.     Palpations: Abdomen is soft. There is no mass.     Tenderness: There is no abdominal tenderness. There is no guarding or rebound.  Musculoskeletal:        General: No tenderness. Normal range of motion.     Cervical back: Normal range of motion and neck supple.     Comments: Mild swelling of the left upper extremity; tenderness on deep palpation.  Skin:    General: Skin is warm.  Neurological:     Mental Status: She is alert and oriented to person, place, and time.  Psychiatric:        Mood and Affect: Affect normal.      LABORATORY DATA:  I have reviewed the data as listed Lab Results  Component Value Date   WBC 2.3 (L) 10/07/2019   HGB 12.7 10/07/2019   HCT 35.6 (L) 10/07/2019   MCV 93.4 10/07/2019   PLT 220 10/07/2019   Recent Labs    08/22/19 0813 09/22/19 0817 10/07/19 1455  NA 139 140 137  K 3.8 3.8 4.8  CL 105 107 103  CO2 24 23 26   GLUCOSE 133* 106* 103*  BUN 19 17 12   CREATININE 0.74 0.66 0.80  CALCIUM 8.8* 8.9 9.2  GFRNONAA >60 >60 >60  GFRAA >60 >60 >60  PROT 7.3 7.4 7.4  ALBUMIN 4.0 4.2 4.1  AST 18 23 27   ALT 26 28 29   ALKPHOS 109 81 88  BILITOT 0.5 0.7 0.5    RADIOGRAPHIC STUDIES: I have personally reviewed the radiological images as listed and agreed with the findings in the report. NM PET Image Restag (PS) Skull Base To Thigh  Result Date: 10/05/2019 CLINICAL DATA:  Subsequent treatment strategy for follicular lymphoma history of last chemotherapy on June 25th. Also  with history of recent COVID vaccination. EXAM: NUCLEAR MEDICINE PET SKULL BASE TO THIGH TECHNIQUE: 7.62 mCi F-18 FDG was  injected intravenously. Full-ring PET imaging was performed from the skull base to thigh after the radiotracer. CT data was obtained and used for attenuation correction and anatomic localization. Fasting blood glucose: 96 mg/dl COMPARISON:  Multiple priors most recent comparison May 18, 2019 FINDINGS: Mediastinal blood pool activity: SUV max 2.17 Liver activity: SUV max 3.23 NECK: No hypermetabolic lymph nodes in the neck. Diminished relative hypermetabolism of the palatine tonsils. Incidental CT findings: Choose 1 CHEST: No hypermetabolic mediastinal or hilar nodes. No suspicious pulmonary nodules on the CT scan. Lymph nodes in the LEFT neck that were less than a cm on the previous study are no longer visible and or nearly completely resolved (image 52 of series 3 4 mm lymph node (SUVmax = 1.0) Incidental CT findings: noncontrast appearance of cardiac structures in the chest is unremarkable. Motion limited assessment of lung parenchyma. Basilar atelectasis. Airways are patent. Granuloma in the LEFT upper lobe. ABDOMEN/PELVIS: No abnormal hypermetabolic activity within the liver, pancreas, adrenal glands, or spleen. No hypermetabolic lymph nodes in the abdomen or pelvis. Spleen is normal size without uptake greater than that of liver. Bulky lymph nodes within the small bowel mesentery have resolved as discrete lymph nodes now with stranding in the central mesentery at the site of previous adenopathy (SUVmax = 2.0 difficult to separate from mesenteric vessels and surrounding bowel loops (image 153, series 3) area in the more central abdominal mesentery previously with a maximum SUV of 5.4 with current SUV of approximately 1.9 on the same image. LEFT retroperitoneal lymph nodes essentially nearly completely resolved (image 145, series 3) 5 mm lymph node in the LEFT retroperitoneum is  diminished in size and maximum uptake in this area is difficult to measure due to surrounding bowel and vascular structures (SUVmax = 2.1) previously 3.2 Incidental CT findings: Post cholecystectomy. Spleen normal size as above. No acute small bowel process. Mesenteric of frank nodal enlargement and soft tissue in the mesentery of the small bowel. Post appendectomy. No adnexal masses. Urinary bladder under distended limiting assessment. SKELETON: No focal hypermetabolic activity to suggest skeletal metastasis. Incidental CT findings: None IMPRESSION: 1. Deauville category 2, response with stranding in the central mesentery at the site of previous adenopathy in the abdomen. No discrete adenopathy currently. 2. Deauville category 2 changes related to retroperitoneal and thoracic inlet lymph nodes. This follows response to therapy in other areas, attention on follow-up. 3. Palatine tonsillar activity with less activity on today's exam than on the previous exam still with symmetry. This area shows a normal appearance. Electronically Signed   By: Zetta Bills M.D.   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 On Obi-benda.  October 04, 2019-PET scan improved response; with significant resolution of the abdominal/retroperitoneal lymph nodes.  #Proceed with Obi-Benda today# 4  In 2 weeks. Labs today reviewed;  acceptable for treatment today.  # RIGHT flank pain/ abodminal pain > 6 months; ? Constipation- 3 days; recommend miralax every day. [place]-2016- 40mm polyp [[place]].   # DISPOSITION:call pt's daughter [maria] for appts.; work letter  # follow upon 22 nd July- MD; labs- cbc/bmp;LDH; Obi-Benda; D-2 Oneal Grout- Dr.B  # I reviewed the blood work- with the patient in detail; also reviewed the imaging independently [as summarized above]; and with the patient in  detail.    All questions were answered. The patient knows to call the  clinic with any problems, questions or concerns.    [name], MD 10/07/2019 4:48 PM

## 2019-10-18 ENCOUNTER — Telehealth: Payer: Self-pay | Admitting: *Deleted

## 2019-10-18 ENCOUNTER — Other Ambulatory Visit: Payer: Self-pay | Admitting: *Deleted

## 2019-10-18 DIAGNOSIS — Z95828 Presence of other vascular implants and grafts: Secondary | ICD-10-CM

## 2019-10-18 DIAGNOSIS — R300 Dysuria: Secondary | ICD-10-CM

## 2019-10-18 DIAGNOSIS — C8238 Follicular lymphoma grade IIIa, lymph nodes of multiple sites: Secondary | ICD-10-CM

## 2019-10-18 NOTE — Telephone Encounter (Signed)
Please order UA/culture.  Thanks GB

## 2019-10-18 NOTE — Telephone Encounter (Signed)
Daughter called back and reports frequency of urination , no color change, burning with urination and with ambulation, Denies fever. Please advise

## 2019-10-18 NOTE — Telephone Encounter (Signed)
Daughter called reporting that patient is having pain with urination and asking if we can check her urine when she comes in tomorrow. I asked her if there is a foul odor, frequency, urgency, discoloration of urine or other symptoms but she did not know. She will ask her mother these questions and call me back

## 2019-10-18 NOTE — Telephone Encounter (Signed)
Urine culture/u/a ordered

## 2019-10-19 ENCOUNTER — Inpatient Hospital Stay: Payer: Commercial Managed Care - PPO | Admitting: Internal Medicine

## 2019-10-19 ENCOUNTER — Other Ambulatory Visit: Payer: Self-pay

## 2019-10-19 ENCOUNTER — Inpatient Hospital Stay: Payer: Commercial Managed Care - PPO

## 2019-10-19 ENCOUNTER — Encounter: Payer: Self-pay | Admitting: Internal Medicine

## 2019-10-19 VITALS — BP 127/84 | HR 72 | Temp 97.8°F | Resp 16 | Ht 60.0 in | Wt 142.8 lb

## 2019-10-19 DIAGNOSIS — R300 Dysuria: Secondary | ICD-10-CM

## 2019-10-19 DIAGNOSIS — C8238 Follicular lymphoma grade IIIa, lymph nodes of multiple sites: Secondary | ICD-10-CM

## 2019-10-19 LAB — CBC WITH DIFFERENTIAL/PLATELET
Abs Immature Granulocytes: 0.01 10*3/uL (ref 0.00–0.07)
Basophils Absolute: 0 10*3/uL (ref 0.0–0.1)
Basophils Relative: 1 %
Eosinophils Absolute: 0.2 10*3/uL (ref 0.0–0.5)
Eosinophils Relative: 9 %
HCT: 37.8 % (ref 36.0–46.0)
Hemoglobin: 13.2 g/dL (ref 12.0–15.0)
Immature Granulocytes: 1 %
Lymphocytes Relative: 24 %
Lymphs Abs: 0.4 10*3/uL — ABNORMAL LOW (ref 0.7–4.0)
MCH: 32.7 pg (ref 26.0–34.0)
MCHC: 34.9 g/dL (ref 30.0–36.0)
MCV: 93.6 fL (ref 80.0–100.0)
Monocytes Absolute: 0.5 10*3/uL (ref 0.1–1.0)
Monocytes Relative: 30 %
Neutro Abs: 0.6 10*3/uL — ABNORMAL LOW (ref 1.7–7.7)
Neutrophils Relative %: 35 %
Platelets: 216 10*3/uL (ref 150–400)
RBC: 4.04 MIL/uL (ref 3.87–5.11)
RDW: 13.3 % (ref 11.5–15.5)
WBC: 1.7 10*3/uL — ABNORMAL LOW (ref 4.0–10.5)
nRBC: 0 % (ref 0.0–0.2)

## 2019-10-19 LAB — HEPATIC FUNCTION PANEL
ALT: 41 U/L (ref 0–44)
AST: 34 U/L (ref 15–41)
Albumin: 4.1 g/dL (ref 3.5–5.0)
Alkaline Phosphatase: 85 U/L (ref 38–126)
Bilirubin, Direct: <0.1 mg/dL (ref 0.0–0.2)
Total Bilirubin: 0.6 mg/dL (ref 0.3–1.2)
Total Protein: 7.4 g/dL (ref 6.5–8.1)

## 2019-10-19 LAB — URINALYSIS, COMPLETE (UACMP) WITH MICROSCOPIC
Bacteria, UA: NONE SEEN
Bilirubin Urine: NEGATIVE
Glucose, UA: NEGATIVE mg/dL
Ketones, ur: NEGATIVE mg/dL
Nitrite: NEGATIVE
Protein, ur: NEGATIVE mg/dL
Specific Gravity, Urine: 1.009 (ref 1.005–1.030)
pH: 5 (ref 5.0–8.0)

## 2019-10-19 LAB — BASIC METABOLIC PANEL
Anion gap: 7 (ref 5–15)
BUN: 15 mg/dL (ref 6–20)
CO2: 25 mmol/L (ref 22–32)
Calcium: 9 mg/dL (ref 8.9–10.3)
Chloride: 108 mmol/L (ref 98–111)
Creatinine, Ser: 0.74 mg/dL (ref 0.44–1.00)
GFR calc Af Amer: >60 mL/min (ref >60)
GFR calc non Af Amer: >60 mL/min (ref >60)
Glucose, Bld: 108 mg/dL — ABNORMAL HIGH (ref 70–99)
Potassium: 4 mmol/L (ref 3.5–5.1)
Sodium: 140 mmol/L (ref 135–145)

## 2019-10-19 LAB — LACTATE DEHYDROGENASE: LDH: 169 U/L (ref 98–192)

## 2019-10-19 MED ORDER — SODIUM CHLORIDE 0.9% FLUSH
10.0000 mL | Freq: Once | INTRAVENOUS | Status: AC
  Administered 2019-10-19: 10 mL via INTRAVENOUS
  Filled 2019-10-19: qty 10

## 2019-10-19 MED ORDER — HEPARIN SOD (PORK) LOCK FLUSH 100 UNIT/ML IV SOLN
500.0000 [IU] | Freq: Once | INTRAVENOUS | Status: AC
  Administered 2019-10-19: 500 [IU] via INTRAVENOUS
  Filled 2019-10-19: qty 5

## 2019-10-19 MED ORDER — HEPARIN SOD (PORK) LOCK FLUSH 100 UNIT/ML IV SOLN
INTRAVENOUS | Status: AC
  Filled 2019-10-19: qty 5

## 2019-10-19 NOTE — Assessment & Plan Note (Addendum)
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 On Obi-benda.  October 04, 2019-PET scan improved response; with significant resolution of the abdominal/retroperitoneal lymph nodes.  # HOLD Obi-Benda today# 4 Labs today reviewed; NOT  acceptable for treatment today-see below.  # Severe Neutropenia- ANC 600; no signs of infection; plan neulasta/onpro with next cycle of chemo.  Hold Neupogen/Granix as no signs of infection.  # RIGHT flank pain/ abodminal pain > 6 months; ? Constipation- 3 days; recommend miralax every day. [place]-2016- 72mm polyp [[place]]. STABLE.   # increased frequency of urination/ lower abdominal pain/ dysuria- UA; neg for infection ; culture- pending. Recommend pyridium [understand urine turn discolored] recommend eval with Urology.   # DISPOSITION:call pt's daughter [maria] for appts. # [facility] referral re: dysuria/increased frequency of urination # HOLD  treatment today; tomorrow.  # Follow up in 2 weeks- MD; labs- cbc/bmp;LDH; Obi-Benda; D-2 Oneal Grout- Dr.B

## 2019-10-19 NOTE — Progress Notes (Signed)
Labs reviewed with MD and treatment team. Per MD to hold treatment today and add hepatic liver function panel to pt.'s labs today. Pt updated and deaccessed by treatment team.   Alanson Puls

## 2019-10-19 NOTE — Progress Notes (Signed)
[place] NOTE  Patient Care Team: Center, [facility] as PCP - General (General Practice)  CHIEF COMPLAINTS/PURPOSE OF CONSULTATION: Lymphoma   Oncology History Overview Note  # 5th FEB 2021- ABDOMINAL MASS-  9.3 x 3.0 cm central mesenteric soft tissue mass is identified on image 38/series 2. 3.0x 3.0 cm collar of soft tissue surrounds branches of the superior mesenteric artery and vein on image 47/2 with relatively little mass-effect on the vascular anatomy. 2.0 x 1.5 cm nodular component of this abnormal soft tissue is identified in the mesentery of the right pelvis on 55/2. FEB 2021- PET-  PET scan-February 2021-SUV around 5.5 again highly suggestive of malignancy lymphoma.  Small cluster left supra pelvic lymph node/small retroperitoneal lymph nodes-5 mm-Douville score 3.   # MARCH 3825- grade 3A follicular lymphoma [Open biopsy Dr. 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 ([place])   07/11/2019 -  Chemotherapy   The patient had dexamethasone (DECADRON) 4 MG tablet, 8 mg, Oral, Daily, 1 of 1 cycle, Start date: --, [name], 0.25 mg, Intravenous,  Once, 3 of 6 cycles Administration: 0.25 mg (07/11/2019), 0.25 mg (08/22/2019), 0.25 mg (09/22/2019) bendamustine (BENDEKA) 150 mg in sodium chloride 0.9 % 50 mL (2.6786 mg/mL) chemo infusion, 90 mg/m2 = 150 mg, Intravenous,  Once, 3 of 6 cycles Administration: 150 mg (07/11/2019), 150 mg (07/12/2019), 150 mg (08/22/2019), 150 mg (08/24/2019), 150 mg (09/22/2019), 150 mg (09/23/2019) obinutuzumab (GAZYVA) 1,[name], 1,000 mg, Intravenous, Once, 3 of 6 cycles Administration: 1,000 mg (07/11/2019),  1,000 mg (07/18/2019), 1,000 mg (08/22/2019), 1,000 mg (07/27/2019), 1,000 mg (09/22/2019)  for chemotherapy treatment.       HISTORY OF PRESENTING ILLNESS: Patient speaks minimal English/patient daughter [name] in office.  Kim Contreras Theall 59 y.o.  female with follicular lymphoma grade 3 currently s/p cycle #3 of Gazyva 4 weeks ago is here for follow-up.  Patient's abdominal pain right lower quadrant/flank pain is overall stable with intermittent constipation.  However in the last 3 to 4 days noted to have increased frequency of urination; mild dysuria.  Also complains of lower abdominal discomfort.  No blood in urine.  No fevers or chills.  Review of Systems  Constitutional: Positive for malaise/fatigue. Negative for chills, diaphoresis, fever and weight loss.  HENT: Negative for nosebleeds and sore throat.   Eyes: Negative for double vision.  Respiratory: Negative for cough, hemoptysis, sputum production, shortness of breath and wheezing.   Cardiovascular: Negative for chest pain, palpitations, orthopnea and leg swelling.  Gastrointestinal: Positive for abdominal pain. Negative for blood in stool, constipation, diarrhea, heartburn, melena and vomiting.  Genitourinary: Positive for dysuria, frequency and urgency.  Musculoskeletal: Positive for back pain and joint pain.  Skin: Negative.  Negative for itching and rash.  Neurological: Positive for tingling and headaches. Negative for dizziness, focal weakness and weakness.  Endo/Heme/Allergies: Does not bruise/bleed easily.  Psychiatric/Behavioral: Negative for depression. The patient is not nervous/anxious and does not have insomnia.      MEDICAL HISTORY:  Past Medical History:  Diagnosis Date  . Anemia 2006  . Anxiety   . Arthritis   . Helicobacter pylori gastritis   . History of  hiatal hernia   . Hypertension   . Lymphoma ([place])     SURGICAL HISTORY: Past Surgical History:  Procedure Laterality Date  . APPENDECTOMY    .  CHOLECYSTECTOMY N/A 01/19/2015   Procedure: LAPAROSCOPIC CHOLECYSTECTOMY;  [name], MD;  [name];  Service: General;  [name];  . COLONOSCOPY WITH PROPOFOL N/A 10/27/2014   Procedure: COLONOSCOPY WITH PROPOFOL;  [name], MD;  Location: [facility] ENDOSCOPY;  Service: Endoscopy;  [name];  . ESOPHAGOGASTRODUODENOSCOPY    . EXCISION MASS ABDOMINAL N/A 06/03/2019   Procedure: EXCISION MASS ABDOMINAL;  [name], MD;  [name];  Service: General;  [name];  abdominal node biopsy  . NASAL SINUS SURGERY    . PORTACATH PLACEMENT Left 07/08/2019   Procedure: INSERTION PORT-A-CATH;  [name], MD;  [name];  Service: General;  Laterality: Left;  . TUBAL LIGATION      SOCIAL HISTORY: Social History   Socioeconomic History  . Marital status: Married    Spouse name: Not on file  . Number of children: Not on file  . Years of education: Not on file  . Highest education level: Not on file  Occupational History  . Not on file  Tobacco Use  . Smoking status: Never Smoker  . Smokeless tobacco: Never Used  Substance and Sexual Activity  . Alcohol use: No  . Drug use: No  . Sexual activity: Not on file  Other Topics Concern  . Not on file  Social History Narrative  . Not on file   Social Determinants of Health   Financial Resource Strain:   . Difficulty of Paying Living Expenses:   Food Insecurity:   . Worried About Charity fundraiser in the Last Year:   . Arboriculturist in the Last Year:   Transportation Needs:   . Film/video editor (Medical):   Marland Kitchen Lack of Transportation (Non-Medical):   Physical Activity:   . Days of Exercise per Week:   . Minutes of Exercise per Session:   Stress:   . Feeling of Stress :   Social Connections:   . Frequency of Communication with Friends and Family:   . Frequency of Social Gatherings with Friends and Family:   . Attends  Religious Services:   . Active Member of Clubs or Organizations:   . Attends Archivist Meetings:   Marland Kitchen Marital Status:   Intimate Partner Violence:   . Fear of Current or Ex-Partner:   . Emotionally Abused:   Marland Kitchen Physically Abused:   . Sexually Abused:     FAMILY HISTORY: Family History  Problem Relation Age of Onset  . Breast cancer Other 42    ALLERGIES:  has No Known Allergies.  MEDICATIONS:  Current Outpatient Medications  Medication Sig Dispense Refill  . dexamethasone (DECADRON) 4 MG tablet Take 1 tablet (4 mg total) by mouth 2 (two) times daily. Start taking 2 days prior to each infusion for a total for 2 days. 30 tablet 1  . HYDROcodone-acetaminophen (NORCO/VICODIN) 5-325 MG tablet Take 1-2 tablets by mouth every 4 (four) hours as needed for moderate pain. 20 tablet 0  . levocetirizine (XYZAL) 5 MG tablet Take 5 mg by mouth every evening.    . lidocaine-prilocaine (EMLA) cream Apply 1 application topically as needed. 30 g 3  . losartan (COZAAR) 50 MG tablet Take 50 mg by mouth daily.    . montelukast (SINGULAIR)  10 MG tablet Take 1 tablet (10 mg total) by mouth at bedtime. 30 tablet 3  . ondansetron (ZOFRAN ODT) 4 MG disintegrating tablet Take 1 tablet (4 mg total) by mouth every 8 (eight) hours as needed for nausea or vomiting. (Patient not taking: Reported on 06/16/2019) 20 tablet 0  . pantoprazole (PROTONIX) 20 MG tablet Take 20 mg by mouth daily.    Marland Kitchen senna-docusate (SENOKOT-S) 8.6-50 MG tablet Take 1 tablet by mouth 2 (two) times daily. 120 tablet 0  . traMADol (ULTRAM) 50 MG tablet Take 1 tablet (50 mg total) by mouth every 12 (twelve) hours as needed. 60 tablet 0   No current facility-administered medications for this visit.      Marland Kitchen  PHYSICAL EXAMINATION: ECOG PERFORMANCE STATUS: 0 - Asymptomatic  Vitals:   10/19/19 0833  BP: 127/84  Pulse: 72  Resp: 16  Temp: 97.8 F (36.6 C)  SpO2: 100%   Filed Weights   10/19/19 0833  Weight: 142 lb 12.8 oz  (64.8 kg)    Physical Exam HENT:     Head: Normocephalic and atraumatic.     Mouth/Throat:     Pharynx: No oropharyngeal exudate.  Eyes:     Pupils: Pupils are equal, round, and reactive to light.  Cardiovascular:     Rate and Rhythm: Normal rate and regular rhythm.  Pulmonary:     Effort: Pulmonary effort is normal. No respiratory distress.     Breath sounds: Normal breath sounds. No wheezing.  Abdominal:     General: Bowel sounds are normal. There is no distension.     Palpations: Abdomen is soft. There is no mass.     Tenderness: There is no abdominal tenderness. There is no guarding or rebound.  Musculoskeletal:        General: No tenderness. Normal range of motion.     Cervical back: Normal range of motion and neck supple.     Comments: Mild swelling of the left upper extremity; tenderness on deep palpation.  Skin:    General: Skin is warm.  Neurological:     Mental Status: She is alert and oriented to person, place, and time.  Psychiatric:        Mood and Affect: Affect normal.      LABORATORY DATA:  I have reviewed the data as listed Lab Results  Component Value Date   WBC 1.7 (L) 10/19/2019   HGB 13.2 10/19/2019   HCT 37.8 10/19/2019   MCV 93.6 10/19/2019   PLT 216 10/19/2019   Recent Labs    09/22/19 0817 10/07/19 1455 10/19/19 0807 10/19/19 0813  NA 140 137 140  --   K 3.8 4.8 4.0  --   CL 107 103 108  --   CO2 23 26 25   --   GLUCOSE 106* 103* 108*  --   BUN 17 12 15   --   CREATININE 0.66 0.80 0.74  --   CALCIUM 8.9 9.2 9.0  --   GFRNONAA >60 >60 >60  --   GFRAA >60 >60 >60  --   PROT 7.4 7.4  --  7.4  ALBUMIN 4.2 4.1  --  4.1  AST 23 27  --  34  ALT 28 29  --  41  ALKPHOS 81 88  --  85  BILITOT 0.7 0.5  --  0.6  BILIDIR  --   --   --  <0.1  IBILI  --   --   --  NOT CALCULATED  RADIOGRAPHIC STUDIES: I have personally reviewed the radiological images as listed and agreed with the findings in the report. NM PET Image Restag (PS) Skull  Base To Thigh  Result Date: 10/05/2019 CLINICAL DATA:  Subsequent treatment strategy for follicular lymphoma history of last chemotherapy on June 25th. Also with history of recent COVID vaccination. EXAM: NUCLEAR MEDICINE PET SKULL BASE TO THIGH TECHNIQUE: 7.62 mCi F-18 FDG was injected intravenously. Full-ring PET imaging was performed from the skull base to thigh after the radiotracer. CT data was obtained and used for attenuation correction and anatomic localization. Fasting blood glucose: 96 mg/dl COMPARISON:  Multiple priors most recent comparison May 18, 2019 FINDINGS: Mediastinal blood pool activity: SUV max 2.17 Liver activity: SUV max 3.23 NECK: No hypermetabolic lymph nodes in the neck. Diminished relative hypermetabolism of the palatine tonsils. Incidental CT findings: Choose 1 CHEST: No hypermetabolic mediastinal or hilar nodes. No suspicious pulmonary nodules on the CT scan. Lymph nodes in the LEFT neck that were less than a cm on the previous study are no longer visible and or nearly completely resolved (image 52 of series 3 4 mm lymph node (SUVmax = 1.0) Incidental CT findings: noncontrast appearance of cardiac structures in the chest is unremarkable. Motion limited assessment of lung parenchyma. Basilar atelectasis. Airways are patent. Granuloma in the LEFT upper lobe. ABDOMEN/PELVIS: No abnormal hypermetabolic activity within the liver, pancreas, adrenal glands, or spleen. No hypermetabolic lymph nodes in the abdomen or pelvis. Spleen is normal size without uptake greater than that of liver. Bulky lymph nodes within the small bowel mesentery have resolved as discrete lymph nodes now with stranding in the central mesentery at the site of previous adenopathy (SUVmax = 2.0 difficult to separate from mesenteric vessels and surrounding bowel loops (image 153, series 3) area in the more central abdominal mesentery previously with a maximum SUV of 5.4 with current SUV of approximately 1.9 on the same  image. LEFT retroperitoneal lymph nodes essentially nearly completely resolved (image 145, series 3) 5 mm lymph node in the LEFT retroperitoneum is diminished in size and maximum uptake in this area is difficult to measure due to surrounding bowel and vascular structures (SUVmax = 2.1) previously 3.2 Incidental CT findings: Post cholecystectomy. Spleen normal size as above. No acute small bowel process. Mesenteric of frank nodal enlargement and soft tissue in the mesentery of the small bowel. Post appendectomy. No adnexal masses. Urinary bladder under distended limiting assessment. SKELETON: No focal hypermetabolic activity to suggest skeletal metastasis. Incidental CT findings: None IMPRESSION: 1. Deauville category 2, response with stranding in the central mesentery at the site of previous adenopathy in the abdomen. No discrete adenopathy currently. 2. Deauville category 2 changes related to retroperitoneal and thoracic inlet lymph nodes. This follows response to therapy in other areas, attention on follow-up. 3. Palatine tonsillar activity with less activity on today's exam than on the previous exam still with symmetry. This area shows a normal appearance. Electronically Signed   By: Zetta Bills M.D.   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 On Obi-benda.  October 04, 2019-PET scan improved response; with significant resolution of the abdominal/retroperitoneal lymph nodes.  # HOLD Obi-Benda today# 4 Labs today reviewed; NOT  acceptable for treatment today-see below.  # Severe Neutropenia- ANC 600; no signs of infection; plan neulasta/onpro with next cycle of chemo.  Hold  Neupogen/Granix as no signs of infection.  # RIGHT flank pain/ abodminal pain > 6 months; ? Constipation- 3 days; recommend miralax every day. [place]-2016- 38mm polyp [[place]].  STABLE.   # increased frequency of urination/ lower abdominal pain/ dysuria- UA; neg for infection ; culture- pending. Recommend pyridium [understand urine turn discolored] recommend eval with Urology.   # DISPOSITION:call pt's daughter [maria] for appts. # [facility] referral re: dysuria/increased frequency of urination # HOLD  treatment today; tomorrow.  # Follow up in 2 weeks- MD; labs- cbc/bmp;LDH; Obi-Benda; D-2 Oneal Grout- Dr.B    All questions were answered. The patient knows to call the clinic with any problems, questions or concerns.    [name], MD 10/19/2019 7:31 PM

## 2019-10-20 ENCOUNTER — Inpatient Hospital Stay: Payer: Commercial Managed Care - PPO

## 2019-10-20 LAB — URINE CULTURE: Culture: <10000 — AB

## 2019-10-24 ENCOUNTER — Telehealth: Payer: Self-pay | Admitting: *Deleted

## 2019-10-24 NOTE — Telephone Encounter (Signed)
Patient was to have been given a prescription for the burning with urination last week but the pharmacy never received prescription for anything. Please advise

## 2019-10-25 MED ORDER — PHENAZOPYRIDINE HCL 100 MG PO TABS
100.0000 mg | ORAL_TABLET | Freq: Three times a day (TID) | ORAL | 0 refills | Status: AC | PRN
Start: 2019-10-25 — End: 2019-11-01

## 2019-10-25 NOTE — Telephone Encounter (Signed)
Call to daughter and informed of prescription sent to [facility] in [place] and went over directions with her. She thanked me for letting her know

## 2019-10-25 NOTE — Telephone Encounter (Signed)
Spoke with Dr. Rogue Bussing. Prescription sent for pyridium 100 mg daily three times a day x 7 days. Sent to patient's pharmacy.   Hassan Rowan - will you call patient's daughter and inform her the script sent to her pharmacy.      I also spoke with Tammy at urology to see if patient's urology apt could be moved up. Patient's apt with Dr. Erlene Quan was on 9/3; she was offered an apt on 8/20. Patient declined the virtual visit apt offered for 8/20

## 2019-10-27 ENCOUNTER — Other Ambulatory Visit: Payer: Self-pay | Admitting: Internal Medicine

## 2019-11-01 ENCOUNTER — Encounter: Payer: Self-pay | Admitting: *Deleted

## 2019-11-02 ENCOUNTER — Telehealth: Payer: Self-pay | Admitting: Internal Medicine

## 2019-11-02 ENCOUNTER — Inpatient Hospital Stay: Payer: Commercial Managed Care - PPO | Admitting: Internal Medicine

## 2019-11-02 ENCOUNTER — Inpatient Hospital Stay: Payer: Commercial Managed Care - PPO

## 2019-11-02 ENCOUNTER — Ambulatory Visit
Admission: RE | Admit: 2019-11-02 | Discharge: 2019-11-02 | Disposition: A | Discharge status: Home / Self Care | Payer: Commercial Managed Care - PPO | Source: Ambulatory Visit | Attending: Internal Medicine | Admitting: Internal Medicine

## 2019-11-02 ENCOUNTER — Inpatient Hospital Stay: Payer: Commercial Managed Care - PPO | Attending: Internal Medicine

## 2019-11-02 ENCOUNTER — Other Ambulatory Visit: Payer: Self-pay

## 2019-11-02 DIAGNOSIS — Z5111 Encounter for antineoplastic chemotherapy: Secondary | ICD-10-CM | POA: Insufficient documentation

## 2019-11-02 DIAGNOSIS — I10 Essential (primary) hypertension: Secondary | ICD-10-CM | POA: Diagnosis not present

## 2019-11-02 DIAGNOSIS — Z5112 Encounter for antineoplastic immunotherapy: Secondary | ICD-10-CM | POA: Diagnosis not present

## 2019-11-02 DIAGNOSIS — Z79899 Other long term (current) drug therapy: Secondary | ICD-10-CM | POA: Insufficient documentation

## 2019-11-02 DIAGNOSIS — M1990 Unspecified osteoarthritis, unspecified site: Secondary | ICD-10-CM | POA: Diagnosis not present

## 2019-11-02 DIAGNOSIS — D709 Neutropenia, unspecified: Secondary | ICD-10-CM | POA: Insufficient documentation

## 2019-11-02 DIAGNOSIS — C8238 Follicular lymphoma grade IIIa, lymph nodes of multiple sites: Secondary | ICD-10-CM | POA: Insufficient documentation

## 2019-11-02 DIAGNOSIS — Z95828 Presence of other vascular implants and grafts: Secondary | ICD-10-CM

## 2019-11-02 DIAGNOSIS — M549 Dorsalgia, unspecified: Secondary | ICD-10-CM | POA: Diagnosis not present

## 2019-11-02 DIAGNOSIS — R350 Frequency of micturition: Secondary | ICD-10-CM | POA: Insufficient documentation

## 2019-11-02 DIAGNOSIS — M7989 Other specified soft tissue disorders: Secondary | ICD-10-CM | POA: Insufficient documentation

## 2019-11-02 DIAGNOSIS — R1031 Right lower quadrant pain: Secondary | ICD-10-CM | POA: Diagnosis not present

## 2019-11-02 DIAGNOSIS — R5383 Other fatigue: Secondary | ICD-10-CM | POA: Diagnosis not present

## 2019-11-02 DIAGNOSIS — R600 Localized edema: Secondary | ICD-10-CM | POA: Insufficient documentation

## 2019-11-02 DIAGNOSIS — R5381 Other malaise: Secondary | ICD-10-CM | POA: Insufficient documentation

## 2019-11-02 LAB — CBC WITH DIFFERENTIAL/PLATELET
Abs Immature Granulocytes: 0.01 10*3/uL (ref 0.00–0.07)
Basophils Absolute: 0 10*3/uL (ref 0.0–0.1)
Basophils Relative: 1 %
Eosinophils Absolute: 0.2 10*3/uL (ref 0.0–0.5)
Eosinophils Relative: 8 %
HCT: 36.2 % (ref 36.0–46.0)
Hemoglobin: 12.5 g/dL (ref 12.0–15.0)
Immature Granulocytes: 1 %
Lymphocytes Relative: 26 %
Lymphs Abs: 0.5 10*3/uL — ABNORMAL LOW (ref 0.7–4.0)
MCH: 33 pg (ref 26.0–34.0)
MCHC: 34.5 g/dL (ref 30.0–36.0)
MCV: 95.5 fL (ref 80.0–100.0)
Monocytes Absolute: 0.4 10*3/uL (ref 0.1–1.0)
Monocytes Relative: 23 %
Neutro Abs: 0.8 10*3/uL — ABNORMAL LOW (ref 1.7–7.7)
Neutrophils Relative %: 41 %
Platelets: 263 10*3/uL (ref 150–400)
RBC: 3.79 MIL/uL — ABNORMAL LOW (ref 3.87–5.11)
RDW: 13 % (ref 11.5–15.5)
WBC: 1.8 10*3/uL — ABNORMAL LOW (ref 4.0–10.5)
nRBC: 0 % (ref 0.0–0.2)

## 2019-11-02 LAB — BASIC METABOLIC PANEL
Anion gap: 9 (ref 5–15)
BUN: 14 mg/dL (ref 6–20)
CO2: 25 mmol/L (ref 22–32)
Calcium: 8.9 mg/dL (ref 8.9–10.3)
Chloride: 105 mmol/L (ref 98–111)
Creatinine, Ser: 0.71 mg/dL (ref 0.44–1.00)
GFR calc Af Amer: >60 mL/min (ref >60)
GFR calc non Af Amer: >60 mL/min (ref >60)
Glucose, Bld: 102 mg/dL — ABNORMAL HIGH (ref 70–99)
Potassium: 3.9 mmol/L (ref 3.5–5.1)
Sodium: 139 mmol/L (ref 135–145)

## 2019-11-02 LAB — LACTATE DEHYDROGENASE: LDH: 168 U/L (ref 98–192)

## 2019-11-02 MED ORDER — SODIUM CHLORIDE 0.9% FLUSH
10.0000 mL | Freq: Once | INTRAVENOUS | Status: AC
  Administered 2019-11-02: 10 mL via INTRAVENOUS
  Filled 2019-11-02: qty 10

## 2019-11-02 MED ORDER — HEPARIN SOD (PORK) LOCK FLUSH 100 UNIT/ML IV SOLN
500.0000 [IU] | Freq: Once | INTRAVENOUS | Status: AC
  Administered 2019-11-02: 500 [IU] via INTRAVENOUS
  Filled 2019-11-02: qty 5

## 2019-11-02 MED ORDER — MELOXICAM 7.5 MG PO TABS
7.5000 mg | ORAL_TABLET | Freq: Every day | ORAL | 0 refills | Status: DC
Start: 2019-11-02 — End: 2019-11-30

## 2019-11-02 NOTE — Progress Notes (Signed)
[place] NOTE  Patient Care Team: Center, [facility] as PCP - General (General Practice)  CHIEF COMPLAINTS/PURPOSE OF CONSULTATION: Lymphoma   Oncology History Overview Note  # 5th FEB 2021- ABDOMINAL MASS-  9.3 x 3.0 cm central mesenteric soft tissue mass is identified on image 38/series 2. 3.0x 3.0 cm collar of soft tissue surrounds branches of the superior mesenteric artery and vein on image 47/2 with relatively little mass-effect on the vascular anatomy. 2.0 x 1.5 cm nodular component of this abnormal soft tissue is identified in the mesentery of the right pelvis on 55/2. FEB 2021- PET-  PET scan-February 2021-SUV around 5.5 again highly suggestive of malignancy lymphoma.  Small cluster left supra pelvic lymph node/small retroperitoneal lymph nodes-5 mm-Douville score 3.   # MARCH 1443- grade 3A follicular lymphoma [Open biopsy Dr. 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 ([place])   07/11/2019 -  Chemotherapy   The patient had dexamethasone (DECADRON) 4 MG tablet, 8 mg, Oral, Daily, 1 of 1 cycle, Start date: --, [name], 0.25 mg, Intravenous,  Once, 3 of 6 cycles Administration: 0.25 mg (07/11/2019), 0.25 mg (08/22/2019), 0.25 mg (09/22/2019) pegfilgrastim-cbqv (UDENYCA) injection 6 mg, 6 mg, Subcutaneous, Once, 0 of 3 cycles bendamustine (BENDEKA) 150 mg in sodium chloride 0.9 % 50 mL (2.6786 mg/mL) chemo infusion, 90 mg/m2 = 150 mg, Intravenous,  Once, 3 of 6 cycles Administration: 150 mg (07/11/2019), 150 mg (07/12/2019), 150 mg (08/22/2019), 150 mg (08/24/2019), 150 mg (09/22/2019), 150 mg (09/23/2019) obinutuzumab (GAZYVA) 1,000 mg in sodium chloride 0.9 % 250 mL (3.4483 mg/mL) chemo  infusion, 1,000 mg, Intravenous, Once, 3 of 6 cycles Administration: 1,000 mg (07/11/2019), 1,000 mg (07/18/2019), 1,000 mg (08/22/2019), 1,000 mg (07/27/2019), 1,000 mg (09/22/2019)  for chemotherapy treatment.       HISTORY OF PRESENTING ILLNESS: Patient speaks minimal English/patient daughter Kim Contreras in office.  Kim Contreras 59 y.o.  female with follicular lymphoma grade 3 currently s/p cycle #3 of Gazyva 6 weeks ago is here for follow-up.  Patient's chemotherapy was held 2 weeks ago because of neutropenia.  Patient here to proceed with cycle #4 of chemotherapy.  Complains of joint pains wrists elbows the last 2 weeks.  Also complains of left lower extremity swelling;  No fever no chills.  Continues to have intermittent right lower quadrant pain.  Increase frequency of urination improved.  S/p Pyridium.  She is currently awaiting urology evaluation.   Review of Systems  Constitutional: Positive for malaise/fatigue. Negative for chills, diaphoresis, fever and weight loss.  HENT: Negative for nosebleeds and sore throat.   Eyes: Negative for double vision.  Respiratory: Negative for cough, hemoptysis, sputum production, shortness of breath and wheezing.   Cardiovascular: Negative for chest pain, palpitations, orthopnea and leg swelling.  Gastrointestinal: Positive for abdominal pain. Negative for blood in stool, constipation, diarrhea, heartburn, melena and vomiting.  Musculoskeletal: Positive for back pain and joint pain.  Skin: Negative.  Negative for itching and rash.  Neurological: Positive for tingling and headaches. Negative for dizziness, focal weakness and weakness.  Endo/Heme/Allergies: Does not bruise/bleed easily.  Psychiatric/Behavioral: Negative for depression. The patient is not nervous/anxious and does not have insomnia.      MEDICAL HISTORY:  Past  Medical History:  Diagnosis Date   Anemia 2006   Anxiety    Arthritis    Helicobacter pylori gastritis    History of  hiatal hernia    Hypertension    Lymphoma ([place])     SURGICAL HISTORY: Past Surgical History:  Procedure Laterality Date   APPENDECTOMY     CHOLECYSTECTOMY N/A 01/19/2015   Procedure: LAPAROSCOPIC CHOLECYSTECTOMY;  [name], MD;  [name];  Service: General;  [name];   COLONOSCOPY WITH PROPOFOL N/A 10/27/2014   Procedure: COLONOSCOPY WITH PROPOFOL;  [name], MD;  Location[facility] ENDOSCOPY;  Service: Endoscopy;  [name];   ESOPHAGOGASTRODUODENOSCOPY     EXCISION MASS ABDOMINAL N/A 06/03/2019   Procedure: EXCISION MASS ABDOMINAL;  [name], MD;  [name];  Service: General;  [name];  abdominal node biopsy   NASAL SINUS SURGERY     PORTACATH PLACEMENT Left 07/08/2019   Procedure: INSERTION PORT-A-CATH;  [name], MD;  [name];  Service: General;  Laterality: Left;   TUBAL LIGATION      SOCIAL HISTORY: Social History   Socioeconomic History   Marital status: Married    Spouse name: Not on file   Number of children: Not on file   Years of education: Not on file   Highest education level: Not on file  Occupational History   Not on file  Tobacco Use   Smoking status: Never Smoker   Smokeless tobacco: Never Used  Substance and Sexual Activity   Alcohol use: No   Drug use: No   Sexual activity: Not on file  Other Topics Concern   Not on file  Social History Narrative   Not on file   Social Determinants of Health   Financial Resource Strain:    Difficulty of Paying Living Expenses:   Food Insecurity:    Worried About Charity fundraiser in the Last Year:    Arboriculturist in the Last Year:   Transportation Needs:    Film/video editor (Medical):    Lack of Transportation (Non-Medical):   Physical Activity:    Days of Exercise per Week:    Minutes of Exercise per Session:   Stress:    Feeling of Stress :    Social Connections:    Frequency of Communication with Friends and Family:    Frequency of Social Gatherings with Friends and Family:    Attends Religious Services:    Active Member of Clubs or Organizations:    Attends Music therapist:    Marital Status:   Intimate Partner Violence:    Fear of Current or Ex-Partner:    Emotionally Abused:    Physically Abused:    Sexually Abused:     FAMILY HISTORY: Family History  Problem Relation Age of Onset   Breast cancer Other 63    ALLERGIES:  has No Known Allergies.  MEDICATIONS:  Current Outpatient Medications  Medication Sig Dispense Refill   dexamethasone (DECADRON) 4 MG tablet Take 1 tablet (4 mg total) by mouth 2 (two) times daily. Start taking 2 days prior to each infusion for a total for 2 days. 30 tablet 1   HYDROcodone-acetaminophen (NORCO/VICODIN) 5-325 MG tablet Take 1-2 tablets by mouth every 4 (four) hours as needed for moderate pain. 20 tablet 0   levocetirizine (XYZAL) 5 MG tablet Take 5 mg by mouth every evening.     lidocaine-prilocaine (EMLA) cream Apply  1 application topically as needed. 30 g 3   losartan (COZAAR) 50 MG tablet Take 50 mg by mouth daily.     montelukast (SINGULAIR) 10 MG tablet Take 1 tablet (10 mg total) by mouth at bedtime. 30 tablet 3   ondansetron (ZOFRAN ODT) 4 MG disintegrating tablet Take 1 tablet (4 mg total) by mouth every 8 (eight) hours as needed for nausea or vomiting. 20 tablet 0   pantoprazole (PROTONIX) 20 MG tablet Take 20 mg by mouth daily.     senna-docusate (SENOKOT-S) 8.6-50 MG tablet Take 1 tablet by mouth 2 (two) times daily. 120 tablet 0   traMADol (ULTRAM) 50 MG tablet Take 1 tablet (50 mg total) by mouth every 12 (twelve) hours as needed. 60 tablet 0   meloxicam (MOBIC) 7.5 MG tablet Take 1 tablet (7.5 mg total) by mouth daily. 30 tablet 0   No current facility-administered medications for this visit.      Marland Kitchen  PHYSICAL  EXAMINATION: ECOG PERFORMANCE STATUS: 0 - Asymptomatic  Vitals:   11/01/19 1549  BP: 135/82  Pulse: 63  Resp: 16  Temp: (!) 96.2 F (35.7 C)  SpO2: 100%   Filed Weights   11/01/19 1549  Weight: 145 lb (65.8 kg)    Physical Exam HENT:     Head: Normocephalic and atraumatic.     Mouth/Throat:     Pharynx: No oropharyngeal exudate.  Eyes:     Pupils: Pupils are equal, round, and reactive to light.  Cardiovascular:     Rate and Rhythm: Normal rate and regular rhythm.  Pulmonary:     Effort: Pulmonary effort is normal. No respiratory distress.     Breath sounds: Normal breath sounds. No wheezing.  Abdominal:     General: Bowel sounds are normal. There is no distension.     Palpations: Abdomen is soft. There is no mass.     Tenderness: There is no abdominal tenderness. There is no guarding or rebound.  Musculoskeletal:        General: No tenderness. Normal range of motion.     Cervical back: Normal range of motion and neck supple.     Comments: Mild swelling of the left lower extremity; mild tenderness noted.  Skin:    General: Skin is warm.  Neurological:     Mental Status: She is alert and oriented to person, place, and time.  Psychiatric:        Mood and Affect: Affect normal.      LABORATORY DATA:  I have reviewed the data as listed Lab Results  Component Value Date   WBC 1.8 (L) 11/02/2019   HGB 12.5 11/02/2019   HCT 36.2 11/02/2019   MCV 95.5 11/02/2019   PLT 263 11/02/2019   Recent Labs    09/22/19 0817 09/22/19 0817 10/07/19 1455 10/19/19 0807 10/19/19 0813 11/02/19 0815  NA 140   < > 137 140  --  139  K 3.8   < > 4.8 4.0  --  3.9  CL 107   < > 103 108  --  105  CO2 23   < > 26 25  --  25  GLUCOSE 106*   < > 103* 108*  --  102*  BUN 17   < > 12 15  --  14  CREATININE 0.66   < > 0.80 0.74  --  0.71  CALCIUM 8.9   < > 9.2 9.0  --  8.9  GFRNONAA >60   < > >  60 >60  --  >60  GFRAA >60   < > >60 >60  --  >60  PROT 7.4  --  7.4  --  7.4  --    ALBUMIN 4.2  --  4.1  --  4.1  --   AST 23  --  27  --  34  --   ALT 28  --  29  --  41  --   ALKPHOS 81  --  88  --  85  --   BILITOT 0.7  --  0.5  --  0.6  --   BILIDIR  --   --   --   --  <0.1  --   IBILI  --   --   --   --  NOT CALCULATED  --    < > = values in this interval not displayed.    RADIOGRAPHIC STUDIES: I have personally reviewed the radiological images as listed and agreed with the findings in the report. NM PET Image Restag (PS) Skull Base To Thigh  Result Date: 10/05/2019 CLINICAL DATA:  Subsequent treatment strategy for follicular lymphoma history of last chemotherapy on June 25th. Also with history of recent COVID vaccination. EXAM: NUCLEAR MEDICINE PET SKULL BASE TO THIGH TECHNIQUE: 7.62 mCi F-18 FDG was injected intravenously. Full-ring PET imaging was performed from the skull base to thigh after the radiotracer. CT data was obtained and used for attenuation correction and anatomic localization. Fasting blood glucose: 96 mg/dl COMPARISON:  Multiple priors most recent comparison May 18, 2019 FINDINGS: Mediastinal blood pool activity: SUV max 2.17 Liver activity: SUV max 3.23 NECK: No hypermetabolic lymph nodes in the neck. Diminished relative hypermetabolism of the palatine tonsils. Incidental CT findings: Choose 1 CHEST: No hypermetabolic mediastinal or hilar nodes. No suspicious pulmonary nodules on the CT scan. Lymph nodes in the LEFT neck that were less than a cm on the previous study are no longer visible and or nearly completely resolved (image 52 of series 3 4 mm lymph node (SUVmax = 1.0) Incidental CT findings: noncontrast appearance of cardiac structures in the chest is unremarkable. Motion limited assessment of lung parenchyma. Basilar atelectasis. Airways are patent. Granuloma in the LEFT upper lobe. ABDOMEN/PELVIS: No abnormal hypermetabolic activity within the liver, pancreas, adrenal glands, or spleen. No hypermetabolic lymph nodes in the abdomen or pelvis.  Spleen is normal size without uptake greater than that of liver. Bulky lymph nodes within the small bowel mesentery have resolved as discrete lymph nodes now with stranding in the central mesentery at the site of previous adenopathy (SUVmax = 2.0 difficult to separate from mesenteric vessels and surrounding bowel loops (image 153, series 3) area in the more central abdominal mesentery previously with a maximum SUV of 5.4 with current SUV of approximately 1.9 on the same image. LEFT retroperitoneal lymph nodes essentially nearly completely resolved (image 145, series 3) 5 mm lymph node in the LEFT retroperitoneum is diminished in size and maximum uptake in this area is difficult to measure due to surrounding bowel and vascular structures (SUVmax = 2.1) previously 3.2 Incidental CT findings: Post cholecystectomy. Spleen normal size as above. No acute small bowel process. Mesenteric of frank nodal enlargement and soft tissue in the mesentery of the small bowel. Post appendectomy. No adnexal masses. Urinary bladder under distended limiting assessment. SKELETON: No focal hypermetabolic activity to suggest skeletal metastasis. Incidental CT findings: None IMPRESSION: 1. Deauville category 2, response with stranding in the central mesentery at the site of  previous adenopathy in the abdomen. No discrete adenopathy currently. 2. Deauville category 2 changes related to retroperitoneal and thoracic inlet lymph nodes. This follows response to therapy in other areas, attention on follow-up. 3. Palatine tonsillar activity with less activity on today's exam than on the previous exam still with symmetry. This area shows a normal appearance. Electronically Signed   By: Zetta Bills M.D.   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 On Obi-benda.  October 04, 2019-PET scan  improved response; with significant resolution of the abdominal/retroperitoneal lymph nodes.  # HOLD Obi-Benda today# 4 Labs today reviewed; Labs today reviewed;  NOT acceptable for treatment today.   # Severe Neutropenia- ANC-800; no signs of infection; plan neulasta/onpro with next cycle of chemo.  Hold Neupogen/Granix as no signs of infection.  # RIGHT flank pain/ abodminal pain > 6 months; ? Constipation- 3 days; recommend miralax every day. Colo-2016- 32mm polyp [[place]]. STABLE.    # increased frequency of urination/ lower abdominal pain/ dysuria- UA; neg for infection ; culture- pending.s/p pyridium; awaiting Urology evaluation.   # Joint pains- ~ 2 weeks-; recommend NSAIDs; ? Tramadol. Recommend meloxicam  # DISPOSITION:call pt's daughter [maria] for appts. # STAT US Left LE # HOLD  treatment today; tomorrow- de-access.  # Follow up in [facility] 23rd- MD; labs- cbc/bmp;LDH; Obi-Benda; D-2 Oneal Grout; D-3 Ellen Henri- Dr.B    All questions were answered. The patient knows to call the clinic with any problems, questions or concerns.    [name], MD 11/02/2019 9:13 AM

## 2019-11-02 NOTE — Assessment & Plan Note (Addendum)
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 On Obi-benda.  October 04, 2019-PET scan improved response; with significant resolution of the abdominal/retroperitoneal lymph nodes.  # HOLD Obi-Benda today# 4 Labs today reviewed; Labs today reviewed;  NOT acceptable for treatment today.   # Severe Neutropenia- ANC-800; no signs of infection; plan neulasta/onpro with next cycle of chemo.  Hold Neupogen/Granix as no signs of infection.  # RIGHT flank pain/ abodminal pain > 6 months; ? Constipation- 3 days; recommend miralax every day. [place]-2016- 67mm polyp [[place]]. STABLE.    # increased frequency of urination/ lower abdominal pain/ dysuria- UA; neg for infection ; culture- pending.s/p pyridium; awaiting Urology evaluation.   # Joint pains- ~ 2 weeks-; recommend NSAIDs; ? Tramadol. Recommend meloxicam  # DISPOSITION:call pt's daughter [maria] for appts. # STAT US Left LE # HOLD  treatment today; tomorrow- de-access.  # Follow up in [facility]- MD; labs- cbc/bmp;LDH; Obi-Benda; D-2 Oneal Grout; D-3 Ellen Henri- Dr.B

## 2019-11-02 NOTE — Telephone Encounter (Signed)
Spoke to patient's daughter [name] regarding results of the ultrasound-negative for DVT.  Question musculoskeletal continue meloxicam.

## 2019-11-03 ENCOUNTER — Inpatient Hospital Stay: Payer: Commercial Managed Care - PPO

## 2019-11-03 ENCOUNTER — Other Ambulatory Visit: Payer: Self-pay | Admitting: Internal Medicine

## 2019-11-16 ENCOUNTER — Other Ambulatory Visit: Payer: Commercial Managed Care - PPO

## 2019-11-16 ENCOUNTER — Ambulatory Visit: Payer: Commercial Managed Care - PPO

## 2019-11-16 ENCOUNTER — Ambulatory Visit: Payer: Commercial Managed Care - PPO | Admitting: Internal Medicine

## 2019-11-17 ENCOUNTER — Ambulatory Visit: Payer: Commercial Managed Care - PPO

## 2019-11-18 ENCOUNTER — Ambulatory Visit: Payer: Self-pay | Admitting: Urology

## 2019-11-21 ENCOUNTER — Other Ambulatory Visit: Payer: Self-pay

## 2019-11-21 ENCOUNTER — Inpatient Hospital Stay: Payer: Commercial Managed Care - PPO | Admitting: Internal Medicine

## 2019-11-21 ENCOUNTER — Inpatient Hospital Stay: Payer: Commercial Managed Care - PPO

## 2019-11-21 DIAGNOSIS — C8238 Follicular lymphoma grade IIIa, lymph nodes of multiple sites: Secondary | ICD-10-CM

## 2019-11-21 DIAGNOSIS — D702 Other drug-induced agranulocytosis: Secondary | ICD-10-CM

## 2019-11-21 DIAGNOSIS — M7989 Other specified soft tissue disorders: Secondary | ICD-10-CM

## 2019-11-21 LAB — CBC WITH DIFFERENTIAL/PLATELET
Abs Immature Granulocytes: 0.02 10*3/uL (ref 0.00–0.07)
Basophils Absolute: 0 10*3/uL (ref 0.0–0.1)
Basophils Relative: 1 %
Eosinophils Absolute: 0.1 10*3/uL (ref 0.0–0.5)
Eosinophils Relative: 5 %
HCT: 36.1 % (ref 36.0–46.0)
Hemoglobin: 12.5 g/dL (ref 12.0–15.0)
Immature Granulocytes: 1 %
Lymphocytes Relative: 22 %
Lymphs Abs: 0.4 10*3/uL — ABNORMAL LOW (ref 0.7–4.0)
MCH: 32.9 pg (ref 26.0–34.0)
MCHC: 34.6 g/dL (ref 30.0–36.0)
MCV: 95 fL (ref 80.0–100.0)
Monocytes Absolute: 0.5 10*3/uL (ref 0.1–1.0)
Monocytes Relative: 30 %
Neutro Abs: 0.7 10*3/uL — ABNORMAL LOW (ref 1.7–7.7)
Neutrophils Relative %: 41 %
Platelets: 251 10*3/uL (ref 150–400)
RBC: 3.8 MIL/uL — ABNORMAL LOW (ref 3.87–5.11)
RDW: 12.9 % (ref 11.5–15.5)
WBC: 1.7 10*3/uL — ABNORMAL LOW (ref 4.0–10.5)
nRBC: 0 % (ref 0.0–0.2)

## 2019-11-21 LAB — BASIC METABOLIC PANEL
Anion gap: 8 (ref 5–15)
BUN: 23 mg/dL — ABNORMAL HIGH (ref 6–20)
CO2: 24 mmol/L (ref 22–32)
Calcium: 8.9 mg/dL (ref 8.9–10.3)
Chloride: 105 mmol/L (ref 98–111)
Creatinine, Ser: 0.79 mg/dL (ref 0.44–1.00)
GFR calc Af Amer: >60 mL/min (ref >60)
GFR calc non Af Amer: >60 mL/min (ref >60)
Glucose, Bld: 105 mg/dL — ABNORMAL HIGH (ref 70–99)
Potassium: 3.9 mmol/L (ref 3.5–5.1)
Sodium: 137 mmol/L (ref 135–145)

## 2019-11-21 LAB — LACTATE DEHYDROGENASE: LDH: 179 U/L (ref 98–192)

## 2019-11-21 MED ORDER — HEPARIN SOD (PORK) LOCK FLUSH 100 UNIT/ML IV SOLN
INTRAVENOUS | Status: AC
  Filled 2019-11-21: qty 5

## 2019-11-21 MED ORDER — HEPARIN SOD (PORK) LOCK FLUSH 100 UNIT/ML IV SOLN
500.0000 [IU] | Freq: Once | INTRAVENOUS | Status: AC
  Administered 2019-11-21: 500 [IU] via INTRAVENOUS
  Filled 2019-11-21: qty 5

## 2019-11-21 MED ORDER — FILGRASTIM-SNDZ 480 MCG/0.8ML IJ SOSY
480.0000 ug | PREFILLED_SYRINGE | Freq: Once | INTRAMUSCULAR | Status: AC
  Administered 2019-11-21: 480 ug via SUBCUTANEOUS
  Filled 2019-11-21: qty 0.8

## 2019-11-21 MED ORDER — SODIUM CHLORIDE 0.9% FLUSH
10.0000 mL | INTRAVENOUS | Status: DC | PRN
  Administered 2019-11-21: 10 mL via INTRAVENOUS
  Filled 2019-11-21: qty 10

## 2019-11-21 NOTE — Progress Notes (Signed)
[place] NOTE  Patient Care Team: Center, [facility] as PCP - General (General Practice)  CHIEF COMPLAINTS/PURPOSE OF CONSULTATION: Lymphoma   Oncology History Overview Note  # 5th FEB 2021- ABDOMINAL MASS-  9.3 x 3.0 cm central mesenteric soft tissue mass is identified on image 38/series 2. 3.0x 3.0 cm collar of soft tissue surrounds branches of the superior mesenteric artery and vein on image 47/2 with relatively little mass-effect on the vascular anatomy. 2.0 x 1.5 cm nodular component of this abnormal soft tissue is identified in the mesentery of the right pelvis on 55/2. FEB 2021- PET-  PET scan-February 2021-SUV around 5.5 again highly suggestive of malignancy lymphoma.  Small cluster left supra pelvic lymph node/small retroperitoneal lymph nodes-5 mm-Douville score 3.   # MARCH 3244- grade 3A follicular lymphoma [Open biopsy Dr. 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 ([place])   07/11/2019 -  Chemotherapy   The patient had dexamethasone (DECADRON) 4 MG tablet, 8 mg, Oral, Daily, 1 of 1 cycle, Start date: --, [name], 0.25 mg, Intravenous,  Once, 3 of 6 cycles Administration: 0.25 mg (07/11/2019), 0.25 mg (08/22/2019), 0.25 mg (09/22/2019) pegfilgrastim-cbqv (UDENYCA) injection 6 mg, 6 mg, Subcutaneous, Once, 0 of 3 cycles bendamustine (BENDEKA) 150 mg in sodium chloride 0.9 % 50 mL (2.6786 mg/mL) chemo infusion, 90 mg/m2 = 150 mg, Intravenous,  Once, 3 of 6 cycles Administration: 150 mg (07/11/2019), 150 mg (07/12/2019), 150 mg (08/22/2019), 150 mg (08/24/2019), 150 mg (09/22/2019), 150 mg (09/23/2019) obinutuzumab (GAZYVA) 1,000 mg in sodium chloride 0.9 % 250 mL (3.4483 mg/mL) chemo  infusion, 1,000 mg, Intravenous, Once, 3 of 6 cycles Administration: 1,000 mg (07/11/2019), 1,000 mg (07/18/2019), 1,000 mg (08/22/2019), 1,000 mg (07/27/2019), 1,000 mg (09/22/2019)  for chemotherapy treatment.       HISTORY OF PRESENTING ILLNESS: Patient speaks minimal English/patient daughter Kim Contreras in office.  Kim Contreras 59 y.o.  female with follicular lymphoma grade 3 currently s/p cycle #3 of Gazyva 8 weeks ago is here for follow-up.  Patient's chemotherapy was held 4 weeks ago because of neutropenia.  Patient here to proceed with cycle #4 of chemotherapy.  Patient continues to complain of joint pains wrist pains.  Not any worse.  Chronic low abdominal pain not any worse.   Review of Systems  Constitutional: Positive for malaise/fatigue. Negative for chills, diaphoresis, fever and weight loss.  HENT: Negative for nosebleeds and sore throat.   Eyes: Negative for double vision.  Respiratory: Negative for cough, hemoptysis, sputum production, shortness of breath and wheezing.   Cardiovascular: Negative for chest pain, palpitations, orthopnea and leg swelling.  Gastrointestinal: Positive for abdominal pain. Negative for blood in stool, constipation, diarrhea, heartburn, melena and vomiting.  Musculoskeletal: Positive for back pain and joint pain.  Skin: Negative.  Negative for itching and rash.  Neurological: Positive for tingling and headaches. Negative for dizziness, focal weakness and weakness.  Endo/Heme/Allergies: Does not bruise/bleed easily.  Psychiatric/Behavioral: Negative for depression. The patient is not nervous/anxious and does not have insomnia.      MEDICAL HISTORY:  Past Medical History:  Diagnosis Date  . Anemia 2006  . Anxiety   . Arthritis   . Helicobacter pylori gastritis   . History of  hiatal hernia   . Hypertension   . Lymphoma ([place])     SURGICAL HISTORY: Past Surgical History:  Procedure Laterality Date  . APPENDECTOMY    . CHOLECYSTECTOMY N/A  01/19/2015   Procedure: LAPAROSCOPIC CHOLECYSTECTOMY;  [name], MD;  [name];  Service: General;  [name];  . COLONOSCOPY WITH PROPOFOL N/A 10/27/2014   Procedure: COLONOSCOPY WITH PROPOFOL;  [name], MD;  Location: [facility]COPY;  Service: Endoscopy;  [name];  . ESOPHAGOGASTRODUODENOSCOPY    . EXCISION MASS ABDOMINAL N/A 06/03/2019   Procedure: EXCISION MASS ABDOMINAL;  [name], MD;  [name];  Service: General;  [name];  abdominal node biopsy  . NASAL SINUS SURGERY    . PORTACATH PLACEMENT Left 07/08/2019   Procedure: INSERTION PORT-A-CATH;  [name], MD;  [name];  Service: General;  Laterality: Left;  . TUBAL LIGATION      SOCIAL HISTORY: Social History   Socioeconomic History  . Marital status: Married    Spouse name: Not on file  . Number of children: Not on file  . Years of education: Not on file  . Highest education level: Not on file  Occupational History  . Not on file  Tobacco Use  . Smoking status: Never Smoker  . Smokeless tobacco: Never Used  Substance and Sexual Activity  . Alcohol use: No  . Drug use: No  . Sexual activity: Not on file  Other Topics Concern  . Not on file  Social History Narrative  . Not on file   Social Determinants of Health   Financial Resource Strain:   . Difficulty of Paying Living Expenses: Not on file  Food Insecurity:   . Worried About Charity fundraiser in the Last Year: Not on file  . Ran Out of Food in the Last Year: Not on file  Transportation Needs:   . Lack of Transportation (Medical): Not on file  . Lack of Transportation (Non-Medical): Not on file  Physical Activity:   . Days of Exercise per Week: Not on file  . Minutes of Exercise per Session: Not on file  Stress:   . Feeling of Stress : Not on file  Social Connections:   . Frequency of Communication with Friends and Family: Not on  file  . Frequency of Social Gatherings with Friends and Family: Not on file  . Attends Religious Services: Not on file  . Active Member of Clubs or Organizations: Not on file  . Attends Archivist Meetings: Not on file  . Marital Status: Not on file  Intimate Partner Violence:   . Fear of Current or Ex-Partner: Not on file  . Emotionally Abused: Not on file  . Physically Abused: Not on file  . Sexually Abused: Not on file    FAMILY HISTORY: Family History  Problem Relation Age of Onset  . Breast cancer Other 42    ALLERGIES:  has No Known Allergies.  MEDICATIONS:  Current Outpatient Medications  Medication Sig Dispense Refill  . dexamethasone (DECADRON) 4 MG tablet Take 1 tablet (4 mg total) by mouth 2 (two) times daily. Start taking 2 days prior to each infusion for a total for 2 days. 30 tablet 1  . HYDROcodone-acetaminophen (NORCO/VICODIN) 5-325 MG tablet Take 1-2 tablets by mouth every 4 (four) hours as needed for moderate pain. 20 tablet 0  . levocetirizine (XYZAL) 5 MG tablet Take 5 mg by mouth every  evening.    . lidocaine-prilocaine (EMLA) cream Apply 1 application topically as needed. 30 g 3  . losartan (COZAAR) 50 MG tablet Take 50 mg by mouth daily.    . meloxicam (MOBIC) 7.5 MG tablet Take 1 tablet (7.5 mg total) by mouth daily. 30 tablet 0  . montelukast (SINGULAIR) 10 MG tablet Take 1 tablet (10 mg total) by mouth at bedtime. 30 tablet 3  . ondansetron (ZOFRAN ODT) 4 MG disintegrating tablet Take 1 tablet (4 mg total) by mouth every 8 (eight) hours as needed for nausea or vomiting. 20 tablet 0  . pantoprazole (PROTONIX) 20 MG tablet Take 20 mg by mouth daily.    Marland Kitchen senna-docusate (SENOKOT-S) 8.6-50 MG tablet Take 1 tablet by mouth 2 (two) times daily. 120 tablet 0  . traMADol (ULTRAM) 50 MG tablet Take 1 tablet (50 mg total) by mouth every 12 (twelve) hours as needed. 60 tablet 0   No current facility-administered medications for this visit.    Facility-Administered Medications Ordered in Other Visits  Medication Dose Route Frequency Provider Last Rate Last Admin  . sodium chloride flush (NS) 0.9 % injection 10 mL  10 mL Intravenous PRN [name], MD   10 mL at 11/21/19 0818      .  PHYSICAL EXAMINATION: ECOG PERFORMANCE STATUS: 0 - Asymptomatic  Vitals:   11/21/19 0827  BP: 130/87  Pulse: 72  Resp: 16  Temp: 97.7 F (36.5 C)  SpO2: 100%   Filed Weights   11/21/19 0827  Weight: 144 lb 9.6 oz (65.6 kg)    Physical Exam HENT:     Head: Normocephalic and atraumatic.     Mouth/Throat:     Pharynx: No oropharyngeal exudate.  Eyes:     Pupils: Pupils are equal, round, and reactive to light.  Cardiovascular:     Rate and Rhythm: Normal rate and regular rhythm.  Pulmonary:     Effort: Pulmonary effort is normal. No respiratory distress.     Breath sounds: Normal breath sounds. No wheezing.  Abdominal:     General: Bowel sounds are normal. There is no distension.     Palpations: Abdomen is soft. There is no mass.     Tenderness: There is no abdominal tenderness. There is no guarding or rebound.  Musculoskeletal:        General: No tenderness. Normal range of motion.     Cervical back: Normal range of motion and neck supple.     Comments: Mild swelling of the left lower extremity; mild tenderness noted.  Skin:    General: Skin is warm.  Neurological:     Mental Status: She is alert and oriented to person, place, and time.  Psychiatric:        Mood and Affect: Affect normal.      LABORATORY DATA:  I have reviewed the data as listed Lab Results  Component Value Date   WBC 1.7 (L) 11/21/2019   HGB 12.5 11/21/2019   HCT 36.1 11/21/2019   MCV 95.0 11/21/2019   PLT 251 11/21/2019   Recent Labs    09/22/19 0817 09/22/19 0817 10/07/19 1455 10/07/19 1455 10/19/19 0807 10/19/19 0813 11/02/19 0815 11/21/19 0818  NA 140   < > 137   < > 140  --  139 137  K 3.8   < > 4.8   < > 4.0  --  3.9  3.9  CL 107   < > 103   < > 108  --  105 105  CO2 23   < > 26   < > 25  --  25 24  GLUCOSE 106*   < > 103*   < > 108*  --  102* 105*  BUN 17   < > 12   < > 15  --  14 23*  CREATININE 0.66   < > 0.80   < > 0.74  --  0.71 0.79  CALCIUM 8.9   < > 9.2   < > 9.0  --  8.9 8.9  GFRNONAA >60   < > >60   < > >60  --  >60 >60  GFRAA >60   < > >60   < > >60  --  >60 >60  PROT 7.4  --  7.4  --   --  7.4  --   --   ALBUMIN 4.2  --  4.1  --   --  4.1  --   --   AST 23  --  27  --   --  34  --   --   ALT 28  --  29  --   --  41  --   --   ALKPHOS 81  --  88  --   --  85  --   --   BILITOT 0.7  --  0.5  --   --  0.6  --   --   BILIDIR  --   --   --   --   --  <0.1  --   --   IBILI  --   --   --   --   --  NOT CALCULATED  --   --    < > = values in this interval not displayed.    RADIOGRAPHIC STUDIES: I have personally reviewed the radiological images as listed and agreed with the findings in the report. US Venous Img Lower Unilateral Left (DVT)  Result Date: 11/02/2019 CLINICAL DATA:  Left leg pain and swelling for 1 day EXAM: LEFT LOWER EXTREMITY VENOUS DOPPLER ULTRASOUND TECHNIQUE: Gray-scale sonography with graded compression, as well as color Doppler and duplex ultrasound were performed to evaluate the lower extremity deep venous systems from the level of the common femoral vein and including the common femoral, femoral, profunda femoral, popliteal and calf veins including the posterior tibial, peroneal and gastrocnemius veins when visible. The superficial great saphenous vein was also interrogated. Spectral Doppler was utilized to evaluate flow at rest and with distal augmentation maneuvers in the common femoral, femoral and popliteal veins. COMPARISON:  None. FINDINGS: Contralateral Common Femoral Vein: Respiratory phasicity is normal and symmetric with the symptomatic side. No evidence of thrombus. Normal compressibility. Common Femoral Vein: No evidence of thrombus. Normal compressibility, respiratory  phasicity and response to augmentation. Saphenofemoral Junction: No evidence of thrombus. Normal compressibility and flow on color Doppler imaging. Profunda Femoral Vein: No evidence of thrombus. Normal compressibility and flow on color Doppler imaging. Femoral Vein: No evidence of thrombus. Normal compressibility, respiratory phasicity and response to augmentation. Popliteal Vein: No evidence of thrombus. Normal compressibility, respiratory phasicity and response to augmentation. Calf Veins: No evidence of thrombus. Normal compressibility and flow on color Doppler imaging. Superficial Great Saphenous Vein: No evidence of thrombus. Normal compressibility. Venous Reflux:  None. Other Findings:  None. IMPRESSION: No evidence of deep venous thrombosis. Electronically Signed   By: Inez Catalina M.D.   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 On Obi-benda.  October 04, 2019-PET scan improved response; with significant resolution of the abdominal/retroperitoneal lymph nodes.stable.  # HOLD Obi-Benda today# 4 Labs today reviewed; Labs today reviewed;  NOT acceptable for treatment today.   # Severe Neutropenia- ANC-700; no signs of infection;however-causing delay in starting cycle #4.  We will plan zarxio.  For the next 5 days.  plan neulasta/onpro with next cycle of chemo.    # RIGHT flank pain/ abodminal pain > 6 months; ? Constipation- 3 days; recommend miralax every day. Colo-2016- 41mm polyp [[place]].  Stable  # Joint pains- ~ 2 weeks; continue meloxicam.  # COVID BOOSTER: Discussed given patient's diagnosis and other comorbidities/therapies-patient would be considered immunocompromised.  As per CDC recommendation/FDA approval-I would recommend booster vaccine.  Patient is interested.  #Given immunocompromise status/ongoing Covid pandemic I think is  reasonable to take him off work.  Letter given.  # DISPOSITION:call pt's daughter [maria] for appts; 4 weeks- time off # HOLD  treatment today; tomorrow- de-access.  # Janey Genta today- until 8/27.  # Follow up in [place]- MD; labs- cbc/bmp;LDH; Obi-Benda; D-2 Oneal Grout; D-3 Ellen Henri- Dr.B    All questions were answered. The patient knows to call the clinic with any problems, questions or concerns.    [name], MD 11/21/2019 9:44 AM

## 2019-11-21 NOTE — Assessment & Plan Note (Addendum)
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 On Obi-benda.  October 04, 2019-PET scan improved response; with significant resolution of the abdominal/retroperitoneal lymph nodes.stable.  # HOLD Obi-Benda today# 4 Labs today reviewed; Labs today reviewed;  NOT acceptable for treatment today.   # Severe Neutropenia- ANC-700; no signs of infection;however-causing delay in starting cycle #4.  We will plan zarxio.  For the next 5 days.  plan neulasta/onpro with next cycle of chemo.    # RIGHT flank pain/ abodminal pain > 6 months; ? Constipation- 3 days; recommend miralax every day. [place]-2016- 44mm polyp [[place]].  Stable  # Joint pains- ~ 2 weeks; continue meloxicam.  # COVID BOOSTER: Discussed given patient's diagnosis and other comorbidities/therapies-patient would be considered immunocompromised.  As per CDC recommendation/FDA approval-I would recommend booster vaccine.  Patient is interested.  #Given immunocompromise status/ongoing Covid pandemic I think is reasonable to take him off work.  Letter given.  # DISPOSITION:call pt's daughter [maria] for appts; 4 weeks- time off # HOLD  treatment today; tomorrow- de-access.  # Janey Genta today- until 8/27.  # Follow up in [place]- MD; labs- cbc/bmp;LDH; Obi-Benda; D-2 Oneal Grout; D-3 Ellen Henri- Dr.B

## 2019-11-22 ENCOUNTER — Inpatient Hospital Stay: Payer: Commercial Managed Care - PPO

## 2019-11-22 DIAGNOSIS — C8238 Follicular lymphoma grade IIIa, lymph nodes of multiple sites: Secondary | ICD-10-CM | POA: Diagnosis not present

## 2019-11-22 DIAGNOSIS — D702 Other drug-induced agranulocytosis: Secondary | ICD-10-CM

## 2019-11-22 MED ORDER — FILGRASTIM-SNDZ 480 MCG/0.8ML IJ SOSY
480.0000 ug | PREFILLED_SYRINGE | Freq: Once | INTRAMUSCULAR | Status: AC
  Administered 2019-11-22: 480 ug via SUBCUTANEOUS
  Filled 2019-11-22: qty 0.8

## 2019-11-23 ENCOUNTER — Inpatient Hospital Stay: Payer: Commercial Managed Care - PPO

## 2019-11-23 ENCOUNTER — Other Ambulatory Visit: Payer: Self-pay

## 2019-11-23 DIAGNOSIS — C8238 Follicular lymphoma grade IIIa, lymph nodes of multiple sites: Secondary | ICD-10-CM

## 2019-11-23 DIAGNOSIS — D702 Other drug-induced agranulocytosis: Secondary | ICD-10-CM

## 2019-11-23 MED ORDER — FILGRASTIM-SNDZ 480 MCG/0.8ML IJ SOSY
480.0000 ug | PREFILLED_SYRINGE | Freq: Once | INTRAMUSCULAR | Status: AC
  Administered 2019-11-23: 480 ug via SUBCUTANEOUS
  Filled 2019-11-23: qty 0.8

## 2019-11-24 ENCOUNTER — Inpatient Hospital Stay: Payer: Commercial Managed Care - PPO

## 2019-11-24 DIAGNOSIS — C8238 Follicular lymphoma grade IIIa, lymph nodes of multiple sites: Secondary | ICD-10-CM

## 2019-11-24 DIAGNOSIS — D702 Other drug-induced agranulocytosis: Secondary | ICD-10-CM

## 2019-11-24 MED ORDER — FILGRASTIM-SNDZ 480 MCG/0.8ML IJ SOSY
480.0000 ug | PREFILLED_SYRINGE | Freq: Once | INTRAMUSCULAR | Status: AC
  Administered 2019-11-24: 480 ug via SUBCUTANEOUS
  Filled 2019-11-24: qty 0.8

## 2019-11-25 ENCOUNTER — Other Ambulatory Visit: Payer: Self-pay

## 2019-11-25 ENCOUNTER — Inpatient Hospital Stay: Payer: Commercial Managed Care - PPO

## 2019-11-25 DIAGNOSIS — C8238 Follicular lymphoma grade IIIa, lymph nodes of multiple sites: Secondary | ICD-10-CM | POA: Diagnosis not present

## 2019-11-25 DIAGNOSIS — D702 Other drug-induced agranulocytosis: Secondary | ICD-10-CM

## 2019-11-25 MED ORDER — FILGRASTIM-SNDZ 480 MCG/0.8ML IJ SOSY
480.0000 ug | PREFILLED_SYRINGE | Freq: Once | INTRAMUSCULAR | Status: AC
  Administered 2019-11-25: 480 ug via SUBCUTANEOUS
  Filled 2019-11-25: qty 0.8

## 2019-11-28 ENCOUNTER — Inpatient Hospital Stay (HOSPITAL_BASED_OUTPATIENT_CLINIC_OR_DEPARTMENT_OTHER): Payer: Commercial Managed Care - PPO | Admitting: Internal Medicine

## 2019-11-28 ENCOUNTER — Encounter: Payer: Self-pay | Admitting: *Deleted

## 2019-11-28 ENCOUNTER — Inpatient Hospital Stay: Payer: Commercial Managed Care - PPO

## 2019-11-28 ENCOUNTER — Encounter: Payer: Self-pay | Admitting: Internal Medicine

## 2019-11-28 ENCOUNTER — Other Ambulatory Visit: Payer: Self-pay

## 2019-11-28 VITALS — BP 133/76 | HR 58 | Temp 98.4°F | Resp 18

## 2019-11-28 DIAGNOSIS — Z7189 Other specified counseling: Secondary | ICD-10-CM

## 2019-11-28 DIAGNOSIS — C8238 Follicular lymphoma grade IIIa, lymph nodes of multiple sites: Secondary | ICD-10-CM | POA: Diagnosis not present

## 2019-11-28 DIAGNOSIS — D702 Other drug-induced agranulocytosis: Secondary | ICD-10-CM

## 2019-11-28 LAB — COMPREHENSIVE METABOLIC PANEL
ALT: 44 U/L (ref 0–44)
AST: 30 U/L (ref 15–41)
Albumin: 4.3 g/dL (ref 3.5–5.0)
Alkaline Phosphatase: 178 U/L — ABNORMAL HIGH (ref 38–126)
Anion gap: 11 (ref 5–15)
BUN: 21 mg/dL — ABNORMAL HIGH (ref 6–20)
CO2: 24 mmol/L (ref 22–32)
Calcium: 9 mg/dL (ref 8.9–10.3)
Chloride: 105 mmol/L (ref 98–111)
Creatinine, Ser: 0.86 mg/dL (ref 0.44–1.00)
GFR calc Af Amer: >60 mL/min (ref >60)
GFR calc non Af Amer: >60 mL/min (ref >60)
Glucose, Bld: 136 mg/dL — ABNORMAL HIGH (ref 70–99)
Potassium: 3.6 mmol/L (ref 3.5–5.1)
Sodium: 140 mmol/L (ref 135–145)
Total Bilirubin: 0.6 mg/dL (ref 0.3–1.2)
Total Protein: 7.7 g/dL (ref 6.5–8.1)

## 2019-11-28 LAB — CBC WITH DIFFERENTIAL/PLATELET
Abs Immature Granulocytes: 1.18 10*3/uL — ABNORMAL HIGH (ref 0.00–0.07)
Basophils Absolute: 0 10*3/uL (ref 0.0–0.1)
Basophils Relative: 0 %
Eosinophils Absolute: 0 10*3/uL (ref 0.0–0.5)
Eosinophils Relative: 0 %
HCT: 35.6 % — ABNORMAL LOW (ref 36.0–46.0)
Hemoglobin: 12.5 g/dL (ref 12.0–15.0)
Immature Granulocytes: 9 %
Lymphocytes Relative: 4 %
Lymphs Abs: 0.6 10*3/uL — ABNORMAL LOW (ref 0.7–4.0)
MCH: 32.7 pg (ref 26.0–34.0)
MCHC: 35.1 g/dL (ref 30.0–36.0)
MCV: 93.2 fL (ref 80.0–100.0)
Monocytes Absolute: 1.4 10*3/uL — ABNORMAL HIGH (ref 0.1–1.0)
Monocytes Relative: 10 %
Neutro Abs: 10.6 10*3/uL — ABNORMAL HIGH (ref 1.7–7.7)
Neutrophils Relative %: 77 %
Platelets: 175 10*3/uL (ref 150–400)
RBC: 3.82 MIL/uL — ABNORMAL LOW (ref 3.87–5.11)
RDW: 14.6 % (ref 11.5–15.5)
Smear Review: NORMAL
WBC: 13.8 10*3/uL — ABNORMAL HIGH (ref 4.0–10.5)
nRBC: 0.5 % — ABNORMAL HIGH (ref 0.0–0.2)

## 2019-11-28 LAB — LACTATE DEHYDROGENASE: LDH: 363 U/L — ABNORMAL HIGH (ref 98–192)

## 2019-11-28 MED ORDER — SODIUM CHLORIDE 0.9 % IV SOLN
90.0000 mg/m2 | Freq: Once | INTRAVENOUS | Status: AC
  Administered 2019-11-28: 150 mg via INTRAVENOUS
  Filled 2019-11-28: qty 6

## 2019-11-28 MED ORDER — SODIUM CHLORIDE 0.9 % IV SOLN
10.0000 mg | Freq: Once | INTRAVENOUS | Status: AC
  Administered 2019-11-28: 10 mg via INTRAVENOUS
  Filled 2019-11-28: qty 10

## 2019-11-28 MED ORDER — ACETAMINOPHEN 325 MG PO TABS
650.0000 mg | ORAL_TABLET | Freq: Once | ORAL | Status: AC
  Administered 2019-11-28: 650 mg via ORAL
  Filled 2019-11-28: qty 2

## 2019-11-28 MED ORDER — HEPARIN SOD (PORK) LOCK FLUSH 100 UNIT/ML IV SOLN
500.0000 [IU] | Freq: Once | INTRAVENOUS | Status: AC | PRN
  Administered 2019-11-28: 500 [IU]
  Filled 2019-11-28: qty 5

## 2019-11-28 MED ORDER — SODIUM CHLORIDE 0.9 % IV SOLN
1000.0000 mg | Freq: Once | INTRAVENOUS | Status: AC
  Administered 2019-11-28: 1000 mg via INTRAVENOUS
  Filled 2019-11-28: qty 40

## 2019-11-28 MED ORDER — SODIUM CHLORIDE 0.9 % IV SOLN
Freq: Once | INTRAVENOUS | Status: AC
  Filled 2019-11-28: qty 250

## 2019-11-28 MED ORDER — PALONOSETRON HCL INJECTION 0.25 MG/5ML
0.2500 mg | Freq: Once | INTRAVENOUS | Status: AC
  Administered 2019-11-28: 0.25 mg via INTRAVENOUS
  Filled 2019-11-28: qty 5

## 2019-11-28 MED ORDER — DIPHENHYDRAMINE HCL 50 MG/ML IJ SOLN
50.0000 mg | Freq: Once | INTRAMUSCULAR | Status: AC
  Administered 2019-11-28: 50 mg via INTRAVENOUS
  Filled 2019-11-28: qty 1

## 2019-11-28 NOTE — Progress Notes (Signed)
Kim Contreras NOTE  Patient Care Team: Center, [facility] ([facility]) as PCP - General (General Practice)  CHIEF COMPLAINTS/PURPOSE OF CONSULTATION: Lymphoma   Oncology History Overview Note  # 5th FEB 2021- ABDOMINAL MASS-  9.3 x 3.0 cm central mesenteric soft tissue mass is identified on image 38/series 2. 3.0x 3.0 cm collar of soft tissue surrounds branches of the superior mesenteric artery and vein on image 47/2 with relatively little mass-effect on the vascular anatomy. 2.0 x 1.5 cm nodular component of this abnormal soft tissue is identified in the mesentery of the right pelvis on 55/2. FEB 2021- PET-  PET scan-February 2021-SUV around 5.5 again highly suggestive of malignancy lymphoma.  Small cluster left supra pelvic lymph node/small retroperitoneal lymph nodes-5 mm-Douville score 3.   # MARCH 2952- grade 3A follicular lymphoma [Open biopsy Dr. 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 ([place])   07/11/2019 -  Chemotherapy   The patient had dexamethasone (DECADRON) 4 MG tablet, 8 mg, Oral, Daily, 1 of 1 cycle, Start date: --, [name], 0.25 mg, Intravenous,  Once, 4 of 6 cycles Administration: 0.25 mg (07/11/2019), 0.25 mg (08/22/2019), 0.25 mg (09/22/2019) pegfilgrastim-cbqv (UDENYCA) injection 6 mg, 6 mg, Subcutaneous, Once, 1 of 3 cycles bendamustine (BENDEKA) 150 mg in sodium chloride 0.9 % 50 mL (2.6786 mg/mL) chemo infusion, 90 mg/m2 = 150 mg, Intravenous,  Once, 4 of 6 cycles Administration: 150 mg (07/11/2019), 150 mg (07/12/2019), 150 mg (08/22/2019), 150 mg (08/24/2019), 150 mg (09/22/2019), 150 mg (09/23/2019) obinutuzumab (GAZYVA) 1,000 mg in sodium chloride 0.9 % 250 mL (3.4483 mg/mL) chemo  infusion, 1,000 mg, Intravenous, Once, 4 of 6 cycles Administration: 1,000 mg (07/11/2019), 1,000 mg (07/18/2019), 1,000 mg (08/22/2019), 1,000 mg (07/27/2019), 1,000 mg (09/22/2019)  for chemotherapy treatment.       HISTORY OF PRESENTING ILLNESS: Patient speaks minimal English/patient daughter Kim Contreras in office.  Kim Contreras 59 y.o.  female with follicular lymphoma grade 3 currently s/p cycle #3 of Gazyva 8 weeks ago is here for follow-up.  Cycle #4 had been held because of continued neutropenia.  Last treatment was approximately 6 weeks ago.  Patient underwent Zarxio daily x5 days last week  Complains of joint pains/back pain.  Not taking meloxicam as recommended.  Abdominal pain is stable.  No new constipation.  Complains of difficulty sleeping.  Review of Systems  Constitutional: Positive for malaise/fatigue. Negative for chills, diaphoresis, fever and weight loss.  HENT: Negative for nosebleeds and sore throat.   Eyes: Negative for double vision.  Respiratory: Negative for cough, hemoptysis, sputum production, shortness of breath and wheezing.   Cardiovascular: Negative for chest pain, palpitations, orthopnea and leg swelling.  Gastrointestinal: Positive for abdominal pain. Negative for blood in stool, constipation, diarrhea, heartburn, melena and vomiting.  Musculoskeletal: Positive for back pain and joint pain.  Skin: Negative.  Negative for itching and rash.  Neurological: Positive for tingling and headaches. Negative for dizziness, focal weakness and weakness.  Endo/Heme/Allergies: Does not bruise/bleed easily.  Psychiatric/Behavioral: Negative for depression. The patient has insomnia. The patient is not nervous/anxious.      MEDICAL HISTORY:  Past Medical History:  Diagnosis Date  . Anemia 2006  . Anxiety   . Arthritis   .  Helicobacter pylori gastritis   . History of hiatal hernia   . Hypertension   . Lymphoma ([place])     SURGICAL HISTORY: Past Surgical History:   Procedure Laterality Date  . APPENDECTOMY    . CHOLECYSTECTOMY N/A 01/19/2015   Procedure: LAPAROSCOPIC CHOLECYSTECTOMY;  [name], MD;  [name];  Service: General;  [name];  . COLONOSCOPY WITH PROPOFOL N/A 10/27/2014   Procedure: COLONOSCOPY WITH PROPOFOL;  [name], MD;  Location: [facility] ENDOSCOPY;  Service: Endoscopy;  [name];  . ESOPHAGOGASTRODUODENOSCOPY    . EXCISION MASS ABDOMINAL N/A 06/03/2019   Procedure: EXCISION MASS ABDOMINAL;  [name], MD;  [name];  Service: General;  [name];  abdominal node biopsy  . NASAL SINUS SURGERY    . PORTACATH PLACEMENT Left 07/08/2019   Procedure: INSERTION PORT-A-CATH;  [name], MD;  [name];  Service: General;  Laterality: Left;  . TUBAL LIGATION      SOCIAL HISTORY: Social History   Socioeconomic History  . Marital status: Married    Spouse name: Not on file  . Number of children: Not on file  . Years of education: Not on file  . Highest education level: Not on file  Occupational History  . Not on file  Tobacco Use  . Smoking status: Never Smoker  . Smokeless tobacco: Never Used  Substance and Sexual Activity  . Alcohol use: No  . Drug use: No  . Sexual activity: Not on file  Other Topics Concern  . Not on file  Social History Narrative  . Not on file   Social Determinants of Health   Financial Resource Strain:   . Difficulty of Paying Living Expenses: Not on file  Food Insecurity:   . Worried About Charity fundraiser in the Last Year: Not on file  . Ran Out of Food in the Last Year: Not on file  Transportation Needs:   . Lack of Transportation (Medical): Not on file  . Lack of Transportation (Non-Medical): Not on file  Physical Activity:   . Days of Exercise per Week: Not on file  . Minutes of Exercise per Session: Not on file  Stress:   . Feeling of Stress : Not on file  Social  Connections:   . Frequency of Communication with Friends and Family: Not on file  . Frequency of Social Gatherings with Friends and Family: Not on file  . Attends Religious Services: Not on file  . Active Member of Clubs or Organizations: Not on file  . Attends Archivist Meetings: Not on file  . Marital Status: Not on file  Intimate Partner Violence:   . Fear of Current or Ex-Partner: Not on file  . Emotionally Abused: Not on file  . Physically Abused: Not on file  . Sexually Abused: Not on file    FAMILY HISTORY: Family History  Problem Relation Age of Onset  . Breast cancer Other 42    ALLERGIES:  has No Known Allergies.  MEDICATIONS:  Current Outpatient Medications  Medication Sig Dispense Refill  . dexamethasone (DECADRON) 4 MG tablet Take 1 tablet (4 mg total) by mouth 2 (two) times daily. Start taking 2 days prior to each infusion for a total for 2 days. 30 tablet 1  . HYDROcodone-acetaminophen (NORCO/VICODIN) 5-325 MG tablet Take 1-2 tablets by mouth every 4 (four) hours as needed for moderate pain. 20 tablet 0  . levocetirizine (XYZAL) 5  MG tablet Take 5 mg by mouth every evening.    . lidocaine-prilocaine (EMLA) cream Apply 1 application topically as needed. 30 g 3  . losartan (COZAAR) 50 MG tablet Take 50 mg by mouth daily.    . meloxicam (MOBIC) 7.5 MG tablet Take 1 tablet (7.5 mg total) by mouth daily. 30 tablet 0  . montelukast (SINGULAIR) 10 MG tablet Take 1 tablet (10 mg total) by mouth at bedtime. 30 tablet 3  . ondansetron (ZOFRAN ODT) 4 MG disintegrating tablet Take 1 tablet (4 mg total) by mouth every 8 (eight) hours as needed for nausea or vomiting. 20 tablet 0  . pantoprazole (PROTONIX) 20 MG tablet Take 20 mg by mouth daily.    Marland Kitchen senna-docusate (SENOKOT-S) 8.6-50 MG tablet Take 1 tablet by mouth 2 (two) times daily. 120 tablet 0  . traMADol (ULTRAM) 50 MG tablet Take 1 tablet (50 mg total) by mouth every 12 (twelve) hours as needed. 60 tablet 0    No current facility-administered medications for this visit.   Facility-Administered Medications Ordered in Other Visits  Medication Dose Route Frequency Provider Last Rate Last Admin  . bendamustine (BENDEKA) 150 mg in sodium chloride 0.9 % 50 mL (2.6786 mg/mL) chemo infusion  90 mg/m2 (Treatment Plan Recorded) Intravenous Once [name], MD      . dexamethasone (DECADRON) 10 mg in sodium chloride 0.9 % 50 mL IVPB  10 mg Intravenous Once [name], MD      . [COMPLETED] diphenhydrAMINE (BENADRYL) injection 50 mg  50 mg Intravenous Once Charlaine Dalton R, MD   50 mg at 11/28/19 0947  . heparin lock flush 100 unit/mL  500 Units Intracatheter Once PRN [name], MD      . obinutuzumab (GAZYVA) 1,[name]  1,000 mg Intravenous Once [name], MD          .  PHYSICAL EXAMINATION: ECOG PERFORMANCE STATUS: 0 - Asymptomatic  Vitals:   11/28/19 0831  BP: (!) 150/92  Pulse: (!) 59  Resp: 16  Temp: (!) 97.1 F (36.2 C)  SpO2: 100%   Filed Weights   11/28/19 0831 11/28/19 0834  Weight: 145 lb (65.8 kg) 145 lb (65.8 kg)    Physical Exam HENT:     Head: Normocephalic and atraumatic.     Mouth/Throat:     Pharynx: No oropharyngeal exudate.  Eyes:     Pupils: Pupils are equal, round, and reactive to light.  Cardiovascular:     Rate and Rhythm: Normal rate and regular rhythm.  Pulmonary:     Effort: Pulmonary effort is normal. No respiratory distress.     Breath sounds: Normal breath sounds. No wheezing.  Abdominal:     General: Bowel sounds are normal. There is no distension.     Palpations: Abdomen is soft. There is no mass.     Tenderness: There is no abdominal tenderness. There is no guarding or rebound.  Musculoskeletal:        General: No tenderness. Normal range of motion.     Cervical back: Normal range of motion and neck supple.     Comments: Mild swelling of  the left lower extremity; mild tenderness noted.  Skin:    General: Skin is warm.  Neurological:     Mental Status: She is alert and oriented to person, place, and time.  Psychiatric:        Mood and Affect:  Affect normal.      LABORATORY DATA:  I have reviewed the data as listed Lab Results  Component Value Date   WBC 13.8 (H) 11/28/2019   HGB 12.5 11/28/2019   HCT 35.6 (L) 11/28/2019   MCV 93.2 11/28/2019   PLT 175 11/28/2019   Recent Labs    10/07/19 1455 10/19/19 0807 10/19/19 0813 11/02/19 0815 11/21/19 0818 11/28/19 0814  NA 137   < >  --  139 137 140  K 4.8   < >  --  3.9 3.9 3.6  CL 103   < >  --  105 105 105  CO2 26   < >  --  25 24 24   GLUCOSE 103*   < >  --  102* 105* 136*  BUN 12   < >  --  14 23* 21*  CREATININE 0.80   < >  --  0.71 0.79 0.86  CALCIUM 9.2   < >  --  8.9 8.9 9.0  GFRNONAA >60   < >  --  >60 >60 >60  GFRAA >60   < >  --  >60 >60 >60  PROT 7.4  --  7.4  --   --  7.7  ALBUMIN 4.1  --  4.1  --   --  4.3  AST 27  --  34  --   --  30  ALT 29  --  41  --   --  44  ALKPHOS 88  --  85  --   --  178*  BILITOT 0.5  --  0.6  --   --  0.6  BILIDIR  --   --  <0.1  --   --   --   IBILI  --   --  NOT CALCULATED  --   --   --    < > = values in this interval not displayed.    RADIOGRAPHIC STUDIES: I have personally reviewed the radiological images as listed and agreed with the findings in the report. US Venous Img Lower Unilateral Left (DVT)  Result Date: 11/02/2019 CLINICAL DATA:  Left leg pain and swelling for 1 day EXAM: LEFT LOWER EXTREMITY VENOUS DOPPLER ULTRASOUND TECHNIQUE: Gray-scale sonography with graded compression, as well as color Doppler and duplex ultrasound were performed to evaluate the lower extremity deep venous systems from the level of the common femoral vein and including the common femoral, femoral, profunda femoral, popliteal and calf veins including the posterior tibial, peroneal and gastrocnemius veins when visible. The  superficial great saphenous vein was also interrogated. Spectral Doppler was utilized to evaluate flow at rest and with distal augmentation maneuvers in the common femoral, femoral and popliteal veins. COMPARISON:  None. FINDINGS: Contralateral Common Femoral Vein: Respiratory phasicity is normal and symmetric with the symptomatic side. No evidence of thrombus. Normal compressibility. Common Femoral Vein: No evidence of thrombus. Normal compressibility, respiratory phasicity and response to augmentation. Saphenofemoral Junction: No evidence of thrombus. Normal compressibility and flow on color Doppler imaging. Profunda Femoral Vein: No evidence of thrombus. Normal compressibility and flow on color Doppler imaging. Femoral Vein: No evidence of thrombus. Normal compressibility, respiratory phasicity and response to augmentation. Popliteal Vein: No evidence of thrombus. Normal compressibility, respiratory phasicity and response to augmentation. Calf Veins: No evidence of thrombus. Normal compressibility and flow on color Doppler imaging. Superficial Great Saphenous Vein: No evidence of thrombus. Normal compressibility. Venous Reflux:  None. Other Findings:  None. IMPRESSION: No evidence of deep  venous thrombosis. Electronically Signed   By: Inez Catalina M.D.   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 On Obi-benda.  October 04, 2019-PET scan improved response; with significant resolution of the abdominal/retroperitoneal lymph nodes.stable.  LDH slightly elevated-clinically not significant monitor for now  # Proceed Obi-Benda today# 4 Labs today reviewed; Labs today reviewed;acceptable for treatment today.   # Severe Neutropenia- last week-ANC-700; needing zarxio x5 ; today white count is 13  # RIGHT flank pain/ abodminal pain > 6 months; ? Constipation- 3  days; recommend miralax every day.STABLE.   # Joint pains/sec to growth factors-continue meloxicam; add claritin daily OTC.   # Insomnia: recommend melatonin qhs/OTC.   # DISPOSITION:call pt's daughter Kim Contreras go back to October 4th. # proceed  treatment today; tomorrow;D-2 Ellen Henri,  # Follow up on Sep 29th;MD; labs- cbc/cmp;LDH; Obi-Benda; D-2 Oneal Grout; D-3 Ellen Henri- Dr.B    All questions were answered. The patient knows to call the clinic with any problems, questions or concerns.    [name], MD 11/28/2019 9:46 AM

## 2019-11-28 NOTE — Assessment & Plan Note (Addendum)
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 On Obi-benda.  October 04, 2019-PET scan improved response; with significant resolution of the abdominal/retroperitoneal lymph nodes.stable.  LDH slightly elevated-clinically not significant monitor for now  # Proceed Obi-Benda today# 4 Labs today reviewed; Labs today reviewed;acceptable for treatment today.   # Severe Neutropenia- last week-ANC-700; needing zarxio x5 ; today white count is 13  # RIGHT flank pain/ abodminal pain > 6 months; ? Constipation- 3 days; recommend miralax every day.STABLE.   # Joint pains/sec to growth factors-continue meloxicam; add claritin daily OTC.   # Insomnia: recommend melatonin qhs/OTC.   # DISPOSITION:call pt's daughter [name] go back to October 4th. # proceed  treatment today; tomorrow;D-2 Ellen Henri,  # Follow up on Sep 29th;MD; labs- cbc/cmp;LDH; Obi-Benda; D-2 Oneal Grout; D-3 Ellen Henri- Dr.B

## 2019-11-29 ENCOUNTER — Inpatient Hospital Stay: Payer: Commercial Managed Care - PPO

## 2019-11-29 ENCOUNTER — Other Ambulatory Visit: Payer: Self-pay | Admitting: Internal Medicine

## 2019-11-29 VITALS — BP 122/76 | HR 67 | Temp 97.7°F | Resp 20

## 2019-11-29 DIAGNOSIS — Z7189 Other specified counseling: Secondary | ICD-10-CM

## 2019-11-29 DIAGNOSIS — C8238 Follicular lymphoma grade IIIa, lymph nodes of multiple sites: Secondary | ICD-10-CM | POA: Diagnosis not present

## 2019-11-29 MED ORDER — SODIUM CHLORIDE 0.9% FLUSH
10.0000 mL | INTRAVENOUS | Status: DC | PRN
  Administered 2019-11-29: 10 mL
  Filled 2019-11-29: qty 10

## 2019-11-29 MED ORDER — HEPARIN SOD (PORK) LOCK FLUSH 100 UNIT/ML IV SOLN
500.0000 [IU] | Freq: Once | INTRAVENOUS | Status: AC | PRN
  Administered 2019-11-29: 500 [IU]
  Filled 2019-11-29: qty 5

## 2019-11-29 MED ORDER — HEPARIN SOD (PORK) LOCK FLUSH 100 UNIT/ML IV SOLN
INTRAVENOUS | Status: AC
  Filled 2019-11-29: qty 5

## 2019-11-29 MED ORDER — SODIUM CHLORIDE 0.9 % IV SOLN
10.0000 mg | Freq: Once | INTRAVENOUS | Status: AC
  Administered 2019-11-29: 10 mg via INTRAVENOUS
  Filled 2019-11-29: qty 10

## 2019-11-29 MED ORDER — SODIUM CHLORIDE 0.9 % IV SOLN
90.0000 mg/m2 | Freq: Once | INTRAVENOUS | Status: AC
  Administered 2019-11-29: 150 mg via INTRAVENOUS
  Filled 2019-11-29: qty 6

## 2019-11-29 MED ORDER — SODIUM CHLORIDE 0.9 % IV SOLN
Freq: Once | INTRAVENOUS | Status: AC
  Filled 2019-11-29: qty 250

## 2019-11-30 ENCOUNTER — Other Ambulatory Visit: Payer: Self-pay

## 2019-11-30 ENCOUNTER — Inpatient Hospital Stay: Payer: Commercial Managed Care - PPO | Attending: Internal Medicine

## 2019-11-30 DIAGNOSIS — Z9221 Personal history of antineoplastic chemotherapy: Secondary | ICD-10-CM | POA: Insufficient documentation

## 2019-11-30 DIAGNOSIS — Z7189 Other specified counseling: Secondary | ICD-10-CM

## 2019-11-30 DIAGNOSIS — K5900 Constipation, unspecified: Secondary | ICD-10-CM | POA: Diagnosis not present

## 2019-11-30 DIAGNOSIS — C8238 Follicular lymphoma grade IIIa, lymph nodes of multiple sites: Secondary | ICD-10-CM | POA: Insufficient documentation

## 2019-11-30 DIAGNOSIS — R5381 Other malaise: Secondary | ICD-10-CM | POA: Insufficient documentation

## 2019-11-30 DIAGNOSIS — G4700 Insomnia, unspecified: Secondary | ICD-10-CM | POA: Diagnosis not present

## 2019-11-30 DIAGNOSIS — M549 Dorsalgia, unspecified: Secondary | ICD-10-CM | POA: Insufficient documentation

## 2019-11-30 DIAGNOSIS — Z5189 Encounter for other specified aftercare: Secondary | ICD-10-CM | POA: Diagnosis not present

## 2019-11-30 DIAGNOSIS — R1010 Upper abdominal pain, unspecified: Secondary | ICD-10-CM | POA: Insufficient documentation

## 2019-11-30 DIAGNOSIS — I10 Essential (primary) hypertension: Secondary | ICD-10-CM | POA: Insufficient documentation

## 2019-11-30 DIAGNOSIS — R5383 Other fatigue: Secondary | ICD-10-CM | POA: Diagnosis not present

## 2019-11-30 DIAGNOSIS — M1990 Unspecified osteoarthritis, unspecified site: Secondary | ICD-10-CM | POA: Insufficient documentation

## 2019-11-30 DIAGNOSIS — G8929 Other chronic pain: Secondary | ICD-10-CM | POA: Diagnosis not present

## 2019-11-30 DIAGNOSIS — Z79899 Other long term (current) drug therapy: Secondary | ICD-10-CM | POA: Diagnosis not present

## 2019-11-30 MED ORDER — PEGFILGRASTIM-CBQV 6 MG/0.6ML ~~LOC~~ SOSY
6.0000 mg | PREFILLED_SYRINGE | Freq: Once | SUBCUTANEOUS | Status: AC
  Administered 2019-11-30: 6 mg via SUBCUTANEOUS
  Filled 2019-11-30: qty 0.6

## 2019-12-01 NOTE — Progress Notes (Signed)
12/02/2019 1:53 PM   Wendee Copp 06/03/1960 [id number]  Referring provider: [name], MD [place],  [place] [address] Chief Complaint  Patient presents with  . Dysuria    New Patient    HPI: Kim Contreras is a 59 y.o. female who is seen today for evaluation and management of dysuria and increased frequency in urination. She is accompanied by an interpreter today.   The patient saw Dr. Rogue Bussing on 11/02/2019 and noted increased frequency of urination was improving. Reported dysuria. She had intermittent right lower quadrant pain. Patient was s/p Pyridium. UA was negative.   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 She is currently undergoing chemotherapy. She recently completed cycle #3 of Gazyva; cycle #4 was held due to neutropenia. Patient underwent Zarxio daily x5 days last week.   PVR is 0 mL today. She reports frequency, urgency and nocturia. She has a burning sensation with urination. Denies vaginal burning. She leaks when laughing, coughing and sneezing. Symptoms are chronic in nature (multiple years but worsening). She has occasional constipation. Denies infection.   The patient is drinking water and coffee.   She reports having a vaginal birth with her children.   Patient is postmenopausal.  Never smoker.    PMH: Past Medical History:  Diagnosis Date  . Anemia 2006  . Anxiety   . Arthritis   . Helicobacter pylori gastritis   . History of hiatal hernia   . Hypertension   . Lymphoma [facility])     Surgical History: Past Surgical History:  Procedure Laterality Date  . APPENDECTOMY    . CHOLECYSTECTOMY N/A 01/19/2015   Procedure: LAPAROSCOPIC CHOLECYSTECTOMY;  [name], MD;  [name];  Service: General;  [name];  . COLONOSCOPY WITH PROPOFOL N/A 10/27/2014   Procedure: COLONOSCOPY WITH PROPOFOL;  [name], MD;   Location: [facility] ENDOSCOPY;  Service: Endoscopy;  [name];  . ESOPHAGOGASTRODUODENOSCOPY    . EXCISION MASS ABDOMINAL N/A 06/03/2019   Procedure: EXCISION MASS ABDOMINAL;  [name], MD;  [name];  Service: General;  [name];  abdominal node biopsy  . NASAL SINUS SURGERY    . PORTACATH PLACEMENT Left 07/08/2019   Procedure: INSERTION PORT-A-CATH;  [name], MD;  [name];  Service: General;  Laterality: Left;  . TUBAL LIGATION      Home Medications:  Allergies as of 12/02/2019   No Known Allergies     Medication List       Accurate as of December 02, 2019  1:53 PM. If you have any questions, ask your nurse or doctor.        dexamethasone 4 MG tablet Commonly known as: DECADRON Take 1 tablet (4 mg total) by mouth 2 (two) times daily. Start taking 2 days prior to each infusion for a total for 2 days.   HYDROcodone-acetaminophen 5-325 MG tablet Commonly known as: NORCO/VICODIN Take 1-2 tablets by mouth every 4 (four) hours as needed for moderate pain.   levocetirizine 5 MG tablet Commonly known as: XYZAL Take 5 mg by mouth every evening.   lidocaine-prilocaine cream Commonly known as: EMLA Apply 1 application topically as needed.   losartan 50 MG tablet Commonly known as: COZAAR Take 50 mg by mouth daily.   meloxicam 7.5 MG tablet Commonly known as: MOBIC TAKE 1 TABLET(7.5 MG) BY MOUTH DAILY   montelukast  10 MG tablet Commonly known as: SINGULAIR Take 1 tablet (10 mg total) by mouth at bedtime.   ondansetron 4 MG disintegrating tablet Commonly known as: Zofran ODT Take 1 tablet (4 mg total) by mouth every 8 (eight) hours as needed for nausea or vomiting.   pantoprazole 20 MG tablet Commonly known as: PROTONIX Take 20 mg by mouth daily.   senna-docusate 8.6-50 MG tablet Commonly known as: Senokot-S Take 1 tablet by mouth 2 (two) times daily.   traMADol 50 MG tablet Commonly known as:  ULTRAM Take 1 tablet (50 mg total) by mouth every 12 (twelve) hours as needed.       Allergies: No Known Allergies  Family History: Family History  Problem Relation Age of Onset  . Breast cancer Other 2    Social History:  reports that she has never smoked. She has never used smokeless tobacco. She reports that she does not drink alcohol and does not use drugs.   Physical Exam: LMP 01/18/2005   Constitutional:  Alert and oriented, No acute distress. HEENT: [place] AT, moist mucus membranes.  Trachea midline, no masses. Cardiovascular: No clubbing, cyanosis, or edema. Respiratory: Normal respiratory effort, no increased work of breathing. Skin: No rashes, bruises or suspicious lesions. Neurologic: Grossly intact, no focal deficits, moving all 4 extremities. Psychiatric: Normal mood and affect.  Laboratory Data:  Lab Results  Component Value Date   CREATININE 0.86 11/28/2019   Urinalysis: There were a few WBCs present.  Pertinent Imaging: Results for orders placed or performed in visit on 12/02/19  BLADDER SCAN AMB NON-IMAGING  Result Value Ref Range   Scan Result 5ml       Assessment & Plan:    1. Stress incontinence  Encouraged patient to try kegel exercises. Patient provided with literature.   2. Urge incontinence/urinary frequency/OAB PVR is 0 mL, adequate emptying  Behavioral modification discussed.   -Given 4  weeksMyrbetriq 25 mg daily, # 28 samples; I have advised the patient of the side effects of Myrbetriq, such as: elevation in BP, urinary retention and/or HA   3. Dysuria UA shows a few WBCs present, will culture today to rule out infection although not suspected  Etiology is somewhat unclear, will assess with a pelvic exam and cystoscopy.   -Urine culture, mycoplasma and ureaplasma culture sent.   Follow up in 1 month recheck symptoms, cystoscopy/pelvic exam  [facility] [address] [place], [place]  [address] [phone]  I, Selena Batten, am acting as a scribe for Dr. Hollice Espy.  I have reviewed the above documentation for accuracy and completeness, and I agree with the above.   Hollice Espy, MD

## 2019-12-02 ENCOUNTER — Other Ambulatory Visit
Admission: RE | Admit: 2019-12-02 | Discharge: 2019-12-02 | Disposition: A | Discharge status: Home / Self Care | Payer: Commercial Managed Care - PPO | Attending: Urology | Admitting: Urology

## 2019-12-02 ENCOUNTER — Other Ambulatory Visit: Payer: Self-pay

## 2019-12-02 ENCOUNTER — Ambulatory Visit: Payer: Commercial Managed Care - PPO | Admitting: Urology

## 2019-12-02 ENCOUNTER — Encounter: Payer: Self-pay | Admitting: Urology

## 2019-12-02 VITALS — BP 118/86 | HR 98 | Ht 60.0 in | Wt 145.0 lb

## 2019-12-02 DIAGNOSIS — R350 Frequency of micturition: Secondary | ICD-10-CM

## 2019-12-02 DIAGNOSIS — R300 Dysuria: Secondary | ICD-10-CM | POA: Diagnosis not present

## 2019-12-02 LAB — URINALYSIS, COMPLETE (UACMP) WITH MICROSCOPIC
Bilirubin Urine: NEGATIVE
Glucose, UA: NEGATIVE mg/dL
Ketones, ur: NEGATIVE mg/dL
Leukocytes,Ua: NEGATIVE
Nitrite: NEGATIVE
Protein, ur: NEGATIVE mg/dL
Specific Gravity, Urine: 1.015 (ref 1.005–1.030)
pH: 5.5 (ref 5.0–8.0)

## 2019-12-02 LAB — BLADDER SCAN AMB NON-IMAGING

## 2019-12-04 LAB — URINE CULTURE: Culture: NO GROWTH

## 2019-12-09 LAB — MISC LABCORP TEST (SEND OUT): Labcorp test code: 86884

## 2019-12-27 ENCOUNTER — Telehealth: Payer: Self-pay | Admitting: *Deleted

## 2019-12-27 NOTE — Telephone Encounter (Signed)
Received message - from [place] in [place] department. Chemotherapy is not yet approved by her insurance. I called patient prior to the apt to see what she would like to do. I explained that Dr. Aletha Halim preference is to reschedule her chemotherapy and wait on the authorization. Patient's daughter spoke to patient. She prefers to come into the clinic tomorrow for her lab/md apt. Pt's daughter also asked for clarify to see if patient was eligible for free drug. Pt "signed patient assistance forms" at last visit to help with medication payment. Will follow-up with Jon Billings on this on behalf of patient.

## 2019-12-28 ENCOUNTER — Encounter: Payer: Self-pay | Admitting: Internal Medicine

## 2019-12-28 ENCOUNTER — Inpatient Hospital Stay: Payer: Commercial Managed Care - PPO

## 2019-12-28 ENCOUNTER — Other Ambulatory Visit: Payer: Self-pay

## 2019-12-28 ENCOUNTER — Inpatient Hospital Stay (HOSPITAL_BASED_OUTPATIENT_CLINIC_OR_DEPARTMENT_OTHER): Payer: Commercial Managed Care - PPO | Admitting: Internal Medicine

## 2019-12-28 DIAGNOSIS — C8238 Follicular lymphoma grade IIIa, lymph nodes of multiple sites: Secondary | ICD-10-CM | POA: Diagnosis not present

## 2019-12-28 LAB — CBC WITH DIFFERENTIAL/PLATELET
Abs Immature Granulocytes: 0.03 10*3/uL (ref 0.00–0.07)
Basophils Absolute: 0 10*3/uL (ref 0.0–0.1)
Basophils Relative: 0 %
Eosinophils Absolute: 0 10*3/uL (ref 0.0–0.5)
Eosinophils Relative: 0 %
HCT: 35.2 % — ABNORMAL LOW (ref 36.0–46.0)
Hemoglobin: 12.6 g/dL (ref 12.0–15.0)
Immature Granulocytes: 0 %
Lymphocytes Relative: 3 %
Lymphs Abs: 0.2 10*3/uL — ABNORMAL LOW (ref 0.7–4.0)
MCH: 33.9 pg (ref 26.0–34.0)
MCHC: 35.8 g/dL (ref 30.0–36.0)
MCV: 94.6 fL (ref 80.0–100.0)
Monocytes Absolute: 0.3 10*3/uL (ref 0.1–1.0)
Monocytes Relative: 4 %
Neutro Abs: 7.6 10*3/uL (ref 1.7–7.7)
Neutrophils Relative %: 93 %
Platelets: 271 10*3/uL (ref 150–400)
RBC: 3.72 MIL/uL — ABNORMAL LOW (ref 3.87–5.11)
RDW: 13.9 % (ref 11.5–15.5)
WBC: 8.1 10*3/uL (ref 4.0–10.5)
nRBC: 0 % (ref 0.0–0.2)

## 2019-12-28 LAB — COMPREHENSIVE METABOLIC PANEL
ALT: 25 U/L (ref 0–44)
AST: 21 U/L (ref 15–41)
Albumin: 4.4 g/dL (ref 3.5–5.0)
Alkaline Phosphatase: 84 U/L (ref 38–126)
Anion gap: 12 (ref 5–15)
BUN: 20 mg/dL (ref 6–20)
CO2: 25 mmol/L (ref 22–32)
Calcium: 9 mg/dL (ref 8.9–10.3)
Chloride: 100 mmol/L (ref 98–111)
Creatinine, Ser: 0.61 mg/dL (ref 0.44–1.00)
GFR calc Af Amer: >60 mL/min (ref >60)
GFR calc non Af Amer: >60 mL/min (ref >60)
Glucose, Bld: 147 mg/dL — ABNORMAL HIGH (ref 70–99)
Potassium: 3.4 mmol/L — ABNORMAL LOW (ref 3.5–5.1)
Sodium: 137 mmol/L (ref 135–145)
Total Bilirubin: 0.5 mg/dL (ref 0.3–1.2)
Total Protein: 7.6 g/dL (ref 6.5–8.1)

## 2019-12-28 LAB — LACTATE DEHYDROGENASE: LDH: 179 U/L (ref 98–192)

## 2019-12-28 MED ORDER — HEPARIN SOD (PORK) LOCK FLUSH 100 UNIT/ML IV SOLN
500.0000 [IU] | Freq: Once | INTRAVENOUS | Status: AC
  Administered 2019-12-28: 500 [IU] via INTRAVENOUS
  Filled 2019-12-28: qty 5

## 2019-12-28 MED ORDER — SODIUM CHLORIDE 0.9% FLUSH
10.0000 mL | INTRAVENOUS | Status: AC | PRN
  Administered 2019-12-28: 10 mL via INTRAVENOUS
  Filled 2019-12-28: qty 10

## 2019-12-28 NOTE — Progress Notes (Signed)
[place] NOTE  Patient Care Team: Center, [facility] as PCP - General (General Practice)  CHIEF COMPLAINTS/PURPOSE OF CONSULTATION: Lymphoma   Oncology History Overview Note  # 5th FEB 2021- ABDOMINAL MASS-  9.3 x 3.0 cm central mesenteric soft tissue mass is identified on image 38/series 2. 3.0x 3.0 cm collar of soft tissue surrounds branches of the superior mesenteric artery and vein on image 47/2 with relatively little mass-effect on the vascular anatomy. 2.0 x 1.5 cm nodular component of this abnormal soft tissue is identified in the mesentery of the right pelvis on 55/2. FEB 2021- PET-  PET scan-February 2021-SUV around 5.5 again highly suggestive of malignancy lymphoma.  Small cluster left supra pelvic lymph node/small retroperitoneal lymph nodes-5 mm-Douville score 3.   # MARCH 1031- grade 3A follicular lymphoma [Open biopsy Dr. 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 ([place])   07/11/2019 -  Chemotherapy   The patient had dexamethasone (DECADRON) 4 MG tablet, 8 mg, Oral, Daily, 1 of 1 cycle, Start date: --, [name], 0.25 mg, Intravenous,  Once, 4 of 6 cycles Administration: 0.25 mg (07/11/2019), 0.25 mg (08/22/2019), 0.25 mg (09/22/2019), 0.25 mg (11/28/2019) pegfilgrastim-cbqv (UDENYCA) injection 6 mg, 6 mg, Subcutaneous, Once, 1 of 3 cycles Administration: 6 mg (11/30/2019) bendamustine (BENDEKA) 150 mg in sodium chloride 0.9 % 50 mL (2.6786 mg/mL) chemo infusion, 90 mg/m2 = 150 mg, Intravenous,  Once, 4 of 6 cycles Administration: 150 mg (07/11/2019), 150 mg (07/12/2019), 150 mg (08/22/2019), 150 mg (08/24/2019), 150 mg (09/22/2019), 150 mg (09/23/2019), 150 mg (11/28/2019), 150 mg  (11/29/2019) obinutuzumab (GAZYVA) 1,[name], 1,000 mg, Intravenous, Once, 4 of 6 cycles Administration: 1,000 mg (07/11/2019), 1,000 mg (07/18/2019), 1,000 mg (08/22/2019), 1,000 mg (07/27/2019), 1,000 mg (09/22/2019), 1,000 mg (11/28/2019)  for chemotherapy treatment.       HISTORY OF PRESENTING ILLNESS: Patient speaks minimal English/patient daughter [name] in office.  Kim Contreras 59 y.o.  female with follicular lymphoma grade 3 currently s/p cycle # 4 of Gazyva 4 weeks ago is here for follow-up.  Patient continues to have reflux-like symptoms; upper abdominal pain.  Patient was taking Protonix stopped 2 weeks ago as she did not feel it was helping.  Otherwise no fever no chills.  Chronic back pain joint pains.  Intermittent constipation. Review of Systems  Constitutional: Positive for malaise/fatigue. Negative for chills, diaphoresis, fever and weight loss.  HENT: Negative for nosebleeds and sore throat.   Eyes: Negative for double vision.  Respiratory: Negative for cough, hemoptysis, sputum production, shortness of breath and wheezing.   Cardiovascular: Negative for chest pain, palpitations, orthopnea and leg swelling.  Gastrointestinal: Positive for abdominal pain. Negative for blood in stool, constipation, diarrhea, heartburn, melena and vomiting.  Musculoskeletal: Positive for back pain and joint pain.  Skin: Negative.  Negative for itching and rash.  Neurological: Positive for tingling and headaches. Negative for dizziness, focal weakness and weakness.  Endo/Heme/Allergies: Does not bruise/bleed easily.  Psychiatric/Behavioral: Negative for depression. The patient has insomnia. The patient is not nervous/anxious.      MEDICAL HISTORY:  Past Medical History:  Diagnosis Date   Anemia 2006   Anxiety  Arthritis    Helicobacter pylori gastritis    History of hiatal hernia    Hypertension    Lymphoma ([place])      SURGICAL HISTORY: Past Surgical History:  Procedure Laterality Date   APPENDECTOMY     CHOLECYSTECTOMY N/A 01/19/2015   Procedure: LAPAROSCOPIC CHOLECYSTECTOMY;  [name], MD;  [name];  Service: General;  [name];   COLONOSCOPY WITH PROPOFOL N/A 10/27/2014   Procedure: COLONOSCOPY WITH PROPOFOL;  [name], MD;  Location: [facility] ENDOSCOPY;  Service: Endoscopy;  [name];   ESOPHAGOGASTRODUODENOSCOPY     EXCISION MASS ABDOMINAL N/A 06/03/2019   Procedure: EXCISION MASS ABDOMINAL;  [name], MD;  [name];  Service: General;  [name];  abdominal node biopsy   NASAL SINUS SURGERY     PORTACATH PLACEMENT Left 07/08/2019   Procedure: INSERTION PORT-A-CATH;  [name], MD;  [name];  Service: General;  Laterality: Left;   TUBAL LIGATION      SOCIAL HISTORY: Social History   Socioeconomic History   Marital status: Married    Spouse name: Not on file   Number of children: Not on file   Years of education: Not on file   Highest education level: Not on file  Occupational History   Not on file  Tobacco Use   Smoking status: Never Smoker   Smokeless tobacco: Never Used  Substance and Sexual Activity   Alcohol use: No   Drug use: No   Sexual activity: Not on file  Other Topics Concern   Not on file  Social History Narrative   Not on file   Social Determinants of Health   Financial Resource Strain:    Difficulty of Paying Living Expenses: Not on file  Food Insecurity:    Worried About [place] in the Last Year: Not on file   Ran Out of Food in the Last Year: Not on file  Transportation Needs:    Lack of Transportation (Medical): Not on file   Lack of Transportation (Non-Medical): Not on file  Physical Activity:    Days of Exercise per Week: Not on file   Minutes of Exercise per Session: Not on file  Stress:     Feeling of Stress : Not on file  Social Connections:    Frequency of Communication with Friends and Family: Not on file   Frequency of Social Gatherings with Friends and Family: Not on file   Attends Religious Services: Not on file   Active Member of Clubs or Organizations: Not on file   Attends Archivist Meetings: Not on file   Marital Status: Not on file  Intimate Partner Violence:    Fear of Current or Ex-Partner: Not on file   Emotionally Abused: Not on file   Physically Abused: Not on file   Sexually Abused: Not on file    FAMILY HISTORY: Family History  Problem Relation Age of Onset   Breast cancer Other 62   Prostate cancer Neg Hx    Bladder Cancer Neg Hx    Kidney cancer Neg Hx     ALLERGIES:  has No Known Allergies.  MEDICATIONS:  Current Outpatient Medications  Medication Sig Dispense Refill   hydrochlorothiazide (HYDRODIURIL) 12.5 MG tablet Take 12.5 mg by mouth daily.     levocetirizine (XYZAL) 5 MG tablet Take 5 mg by mouth every evening.     losartan (COZAAR) 50 MG tablet Take 50  mg by mouth daily.     mirabegron ER (MYRBETRIQ) 25 MG TB24 tablet Take 25 mg by mouth daily.     montelukast (SINGULAIR) 10 MG tablet Take 1 tablet (10 mg total) by mouth at bedtime. 30 tablet 3   pantoprazole (PROTONIX) 20 MG tablet Take 20 mg by mouth daily.     No current facility-administered medications for this visit.   Facility-Administered Medications Ordered in Other Visits  Medication Dose Route Frequency Provider Last Rate Last Admin   sodium chloride flush (NS) 0.9 % injection 10 mL  10 mL Intravenous PRN [name], MD   10 mL at 12/28/19 0815      .  PHYSICAL EXAMINATION: ECOG PERFORMANCE STATUS: 0 - Asymptomatic  Vitals:   12/28/19 0831  BP: (!) 142/80  Pulse: 63  Resp: 16  Temp: 98 F (36.7 C)  SpO2: 100%   Filed Weights   12/28/19 0831  Weight: 144 lb 12.8 oz (65.7 kg)    Physical Exam HENT:     Head:  Normocephalic and atraumatic.     Mouth/Throat:     Pharynx: No oropharyngeal exudate.  Eyes:     Pupils: Pupils are equal, round, and reactive to light.  Cardiovascular:     Rate and Rhythm: Normal rate and regular rhythm.  Pulmonary:     Effort: Pulmonary effort is normal. No respiratory distress.     Breath sounds: Normal breath sounds. No wheezing.  Abdominal:     General: Bowel sounds are normal. There is no distension.     Palpations: Abdomen is soft. There is no mass.     Tenderness: There is no abdominal tenderness. There is no guarding or rebound.  Musculoskeletal:        General: No tenderness. Normal range of motion.     Cervical back: Normal range of motion and neck supple.     Comments: Mild swelling of the left lower extremity; mild tenderness noted.  Skin:    General: Skin is warm.  Neurological:     Mental Status: She is alert and oriented to person, place, and time.  Psychiatric:        Mood and Affect: Affect normal.      LABORATORY DATA:  I have reviewed the data as listed Lab Results  Component Value Date   WBC 8.1 12/28/2019   HGB 12.6 12/28/2019   HCT 35.2 (L) 12/28/2019   MCV 94.6 12/28/2019   PLT 271 12/28/2019   Recent Labs    10/19/19 0813 11/02/19 0815 11/21/19 0818 11/28/19 0814 12/28/19 0815  NA  --    < > 137 140 137  K  --    < > 3.9 3.6 3.4*  CL  --    < > 105 105 100  CO2  --    < > '24 24 25  ' GLUCOSE  --    < > 105* 136* 147*  BUN  --    < > 23* 21* 20  CREATININE  --    < > 0.79 0.86 0.61  CALCIUM  --    < > 8.9 9.0 9.0  GFRNONAA  --    < > >60 >60 >60  GFRAA  --    < > >60 >60 >60  PROT 7.4  --   --  7.7 7.6  ALBUMIN 4.1  --   --  4.3 4.4  AST 34  --   --  30 21  ALT 41  --   --  44 25  ALKPHOS 85  --   --  178* 84  BILITOT 0.6  --   --  0.6 0.5  BILIDIR <0.1  --   --   --   --   IBILI NOT CALCULATED  --   --   --   --    < > = values in this interval not displayed.    RADIOGRAPHIC STUDIES: I have personally  reviewed the radiological images as listed and agreed with the findings in the report. No results found.  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 On Obi-benda.  October 04, 2019-PET scan improved response; with significant resolution of the abdominal/retroperitoneal lymph nodes; STABLE.   # Proceed Obi-Benda today# 5 Labs today reviewed-  Labs today reviewed;acceptable for treatment however, awaiting insurance approval.  Met with [facility], pharmacy-patient will get free Gazyva; but not bendamustine/growth factor.  Awaiting formal report from insurance.  Discussed with patient will need total of 6 cycles.   # constipation/ abodminal pain/reflux- continue miralax every day. STABLE;recommend Protonix BID prior to meals.   # Joint pains/sec to growth factors-continue meloxicam; add claritin daily OTC.   # Insomnia: recommend melatonin qhs/OTC.   # DISPOSITION: HOLD treatment today; port-de-access # Follow up TBD- Dr.B ---------------------------------------------------------------------------------------------------------------------------- # proceed  treatment today; tomorrow;D-2 Ellen Henri,  # Follow up on **;MD; labs- cbc/cmp;LDH; Obi-Benda; D-2 Oneal Grout; D-3 Ellen Henri- Dr.B    All questions were answered. The patient knows to call the clinic with any problems, questions or concerns.    [name], MD 12/28/2019 9:45 AM

## 2019-12-28 NOTE — Assessment & Plan Note (Addendum)
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 On Obi-benda.  October 04, 2019-PET scan improved response; with significant resolution of the abdominal/retroperitoneal lymph nodes; STABLE.   # Proceed Obi-Benda today# 5 Labs today reviewed-  Labs today reviewed;acceptable for treatment however, awaiting insurance approval.  Met with [facility], pharmacy-patient will get free Gazyva; but not bendamustine/growth factor.  Awaiting formal report from insurance.  Discussed with patient will need total of 6 cycles.   # constipation/ abodminal pain/reflux- continue miralax every day. STABLE;recommend Protonix BID prior to meals.   # Joint pains/sec to growth factors-continue meloxicam; add claritin daily OTC.   # Insomnia: recommend melatonin qhs/OTC.   # DISPOSITION: HOLD treatment today; port-de-access # Follow up TBD- Dr.B ---------------------------------------------------------------------------------------------------------------------------- # proceed  treatment today; tomorrow;D-2 Ellen Henri,  # Follow up on **;MD; labs- cbc/cmp;LDH; Obi-Benda; D-2 Oneal Grout; D-3 Ellen Henri- Dr.B

## 2019-12-28 NOTE — Telephone Encounter (Signed)
Kim Contreras spoke with patient in office today to explain medication coverage and etc.

## 2019-12-29 ENCOUNTER — Inpatient Hospital Stay: Payer: Commercial Managed Care - PPO

## 2019-12-29 ENCOUNTER — Other Ambulatory Visit: Payer: Self-pay | Admitting: *Deleted

## 2019-12-29 DIAGNOSIS — R300 Dysuria: Secondary | ICD-10-CM

## 2019-12-29 NOTE — Progress Notes (Signed)
   12/30/2019  CC:  Chief Complaint  Patient presents with  . Cysto    HPI: Kim Contreras is a 59 y.o. female who returns for a cystoscopy.   The patient saw Dr. Rogue Bussing on 11/02/2019 and noted increased frequency of urination was improving. Reported dysuria. She had intermittent right lower quadrant pain. Patient was s/p Pyridium. UA was negative.   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 She is currently undergoing chemotherapy. She recently completed cycle #3 of Gazyva; cycle #4 was held due to neutropenia. Patient underwent Zarxio daily x5 days last week.   PVR was 0 mL today. She reported frequency, urgency and nocturia. She had a burning sensation with urination. Denied vaginal burning. She leaked when laughing, coughing and sneezing. Symptoms were chronic in nature (multiple years but worsening). She had occasional constipation. Denied infection.   The patient was drinking water and coffee.   She reports dramatic symptomatic and behavioral improvement with Mrybetriq 25 mg. Dysuria has resolved.   She reported having a vaginal birth with her children.   Patient was postmenopausal.  Never smoker.   Blood pressure 127/88, pulse 71, height 5' (1.524 m), weight 146 lb (66.2 kg), last menstrual period 01/18/2005. NED. A&Ox3.   No respiratory distress   Abd soft, NT, ND Normal external genitalia with patent urethral meatus  Cystoscopy Procedure Note  Patient identification was confirmed, informed consent was obtained, and patient was prepped using Betadine solution.  Lidocaine jelly was administered per urethral meatus.    Procedure: - Flexible cystoscope introduced, without any difficulty.   - Thorough search of the bladder revealed:    normal urethral meatus    normal urothelium    no stones    no ulcers     no tumors    no urethral polyps    no trabeculation  - Ureteral orifices were  normal in position and appearance.  Post-Procedure: - Patient tolerated the procedure well  Assessment/ Plan:  1. Stress incontinence  Continue kegel exercises.   2. Urge incontinence/urinary frequency/OAB Significant improvement of urinary symptoms. Continue Myrbetriq 25 mg.  Cystoscopy is negative RTC in 6 months with Zara Council, PA-C.   3. Dysuria  Resolved  I, Selena Batten, am acting as a scribe for Dr. Hollice Espy.  I have reviewed the above documentation for accuracy and completeness, and I agree with the above.   Hollice Espy, MD

## 2019-12-30 ENCOUNTER — Inpatient Hospital Stay: Payer: Commercial Managed Care - PPO

## 2019-12-30 ENCOUNTER — Other Ambulatory Visit
Admission: RE | Admit: 2019-12-30 | Discharge: 2019-12-30 | Disposition: A | Discharge status: Home / Self Care | Payer: Commercial Managed Care - PPO | Attending: Urology | Admitting: Urology

## 2019-12-30 ENCOUNTER — Encounter: Payer: Self-pay | Admitting: Urology

## 2019-12-30 ENCOUNTER — Ambulatory Visit (INDEPENDENT_AMBULATORY_CARE_PROVIDER_SITE_OTHER): Payer: Commercial Managed Care - PPO | Admitting: Urology

## 2019-12-30 ENCOUNTER — Other Ambulatory Visit: Payer: Self-pay | Admitting: Internal Medicine

## 2019-12-30 ENCOUNTER — Other Ambulatory Visit: Payer: Self-pay

## 2019-12-30 VITALS — BP 127/88 | HR 71 | Ht 60.0 in | Wt 146.0 lb

## 2019-12-30 DIAGNOSIS — R300 Dysuria: Secondary | ICD-10-CM | POA: Insufficient documentation

## 2019-12-30 DIAGNOSIS — R350 Frequency of micturition: Secondary | ICD-10-CM

## 2019-12-30 LAB — URINALYSIS, COMPLETE (UACMP) WITH MICROSCOPIC
Bacteria, UA: NONE SEEN
Bilirubin Urine: NEGATIVE
Glucose, UA: NEGATIVE mg/dL
Hgb urine dipstick: NEGATIVE
Ketones, ur: NEGATIVE mg/dL
Leukocytes,Ua: NEGATIVE
Nitrite: NEGATIVE
Protein, ur: NEGATIVE mg/dL
RBC / HPF: NONE SEEN RBC/hpf (ref 0–5)
Specific Gravity, Urine: <1.005 — ABNORMAL LOW (ref 1.005–1.030)
WBC, UA: NONE SEEN WBC/hpf (ref 0–5)
pH: 6.5 (ref 5.0–8.0)

## 2019-12-30 MED ORDER — MIRABEGRON ER 25 MG PO TB24
25.0000 mg | ORAL_TABLET | Freq: Every day | ORAL | 11 refills | Status: DC
Start: 2019-12-30 — End: 2021-08-30

## 2020-01-03 ENCOUNTER — Inpatient Hospital Stay: Payer: Commercial Managed Care - PPO | Attending: Internal Medicine

## 2020-01-03 ENCOUNTER — Other Ambulatory Visit: Payer: Self-pay | Admitting: Internal Medicine

## 2020-01-03 ENCOUNTER — Other Ambulatory Visit: Payer: Self-pay

## 2020-01-03 VITALS — BP 121/67 | HR 66 | Temp 97.2°F | Resp 20 | Wt 147.6 lb

## 2020-01-03 DIAGNOSIS — K219 Gastro-esophageal reflux disease without esophagitis: Secondary | ICD-10-CM | POA: Diagnosis not present

## 2020-01-03 DIAGNOSIS — M549 Dorsalgia, unspecified: Secondary | ICD-10-CM | POA: Insufficient documentation

## 2020-01-03 DIAGNOSIS — I10 Essential (primary) hypertension: Secondary | ICD-10-CM | POA: Insufficient documentation

## 2020-01-03 DIAGNOSIS — Z9049 Acquired absence of other specified parts of digestive tract: Secondary | ICD-10-CM | POA: Diagnosis not present

## 2020-01-03 DIAGNOSIS — Z5111 Encounter for antineoplastic chemotherapy: Secondary | ICD-10-CM | POA: Diagnosis present

## 2020-01-03 DIAGNOSIS — Z5112 Encounter for antineoplastic immunotherapy: Secondary | ICD-10-CM | POA: Diagnosis not present

## 2020-01-03 DIAGNOSIS — C8298 Follicular lymphoma, unspecified, lymph nodes of multiple sites: Secondary | ICD-10-CM | POA: Diagnosis present

## 2020-01-03 DIAGNOSIS — Z79899 Other long term (current) drug therapy: Secondary | ICD-10-CM | POA: Insufficient documentation

## 2020-01-03 DIAGNOSIS — G8929 Other chronic pain: Secondary | ICD-10-CM | POA: Insufficient documentation

## 2020-01-03 DIAGNOSIS — Z7189 Other specified counseling: Secondary | ICD-10-CM

## 2020-01-03 DIAGNOSIS — Z5189 Encounter for other specified aftercare: Secondary | ICD-10-CM | POA: Insufficient documentation

## 2020-01-03 DIAGNOSIS — Z791 Long term (current) use of non-steroidal anti-inflammatories (NSAID): Secondary | ICD-10-CM | POA: Diagnosis not present

## 2020-01-03 DIAGNOSIS — C8238 Follicular lymphoma grade IIIa, lymph nodes of multiple sites: Secondary | ICD-10-CM

## 2020-01-03 DIAGNOSIS — K5909 Other constipation: Secondary | ICD-10-CM | POA: Diagnosis not present

## 2020-01-03 MED ORDER — DIPHENHYDRAMINE HCL 50 MG/ML IJ SOLN
50.0000 mg | Freq: Once | INTRAMUSCULAR | Status: AC
  Administered 2020-01-03: 50 mg via INTRAVENOUS

## 2020-01-03 MED ORDER — SODIUM CHLORIDE 0.9 % IV SOLN
10.0000 mg | Freq: Once | INTRAVENOUS | Status: AC
  Administered 2020-01-03: 10 mg via INTRAVENOUS
  Filled 2020-01-03: qty 10

## 2020-01-03 MED ORDER — SODIUM CHLORIDE 0.9% FLUSH
10.0000 mL | INTRAVENOUS | Status: DC | PRN
  Administered 2020-01-03: 10 mL
  Filled 2020-01-03: qty 10

## 2020-01-03 MED ORDER — SODIUM CHLORIDE 0.9 % IV SOLN
Freq: Once | INTRAVENOUS | Status: AC
  Filled 2020-01-03: qty 250

## 2020-01-03 MED ORDER — SODIUM CHLORIDE 0.9 % IV SOLN
90.0000 mg/m2 | Freq: Once | INTRAVENOUS | Status: AC
  Administered 2020-01-03: 150 mg via INTRAVENOUS
  Filled 2020-01-03: qty 6

## 2020-01-03 MED ORDER — SODIUM CHLORIDE 0.9 % IV SOLN
1000.0000 mg | Freq: Once | INTRAVENOUS | Status: AC
  Administered 2020-01-03: 1000 mg via INTRAVENOUS
  Filled 2020-01-03: qty 40

## 2020-01-03 MED ORDER — PALONOSETRON HCL INJECTION 0.25 MG/5ML
0.2500 mg | Freq: Once | INTRAVENOUS | Status: AC
  Administered 2020-01-03: 0.25 mg via INTRAVENOUS
  Filled 2020-01-03: qty 5

## 2020-01-03 MED ORDER — HEPARIN SOD (PORK) LOCK FLUSH 100 UNIT/ML IV SOLN
INTRAVENOUS | Status: AC
  Filled 2020-01-03: qty 5

## 2020-01-03 MED ORDER — ACETAMINOPHEN 325 MG PO TABS
650.0000 mg | ORAL_TABLET | Freq: Once | ORAL | Status: AC
  Administered 2020-01-03: 650 mg via ORAL
  Filled 2020-01-03: qty 2

## 2020-01-03 MED ORDER — HEPARIN SOD (PORK) LOCK FLUSH 100 UNIT/ML IV SOLN
500.0000 [IU] | Freq: Once | INTRAVENOUS | Status: AC | PRN
  Administered 2020-01-03: 500 [IU]
  Filled 2020-01-03: qty 5

## 2020-01-03 NOTE — Progress Notes (Signed)
Per MD, Dr. Rogue Bussing, order: lab work is not needed today; reference lab work from 12/28/2019 and proceed with [name] and [place] treatment today.

## 2020-01-04 ENCOUNTER — Inpatient Hospital Stay: Payer: Commercial Managed Care - PPO

## 2020-01-04 ENCOUNTER — Other Ambulatory Visit: Payer: Self-pay

## 2020-01-04 VITALS — BP 134/79 | HR 61 | Temp 98.0°F | Resp 18

## 2020-01-04 DIAGNOSIS — Z5112 Encounter for antineoplastic immunotherapy: Secondary | ICD-10-CM | POA: Diagnosis not present

## 2020-01-04 DIAGNOSIS — Z7189 Other specified counseling: Secondary | ICD-10-CM

## 2020-01-04 DIAGNOSIS — C8238 Follicular lymphoma grade IIIa, lymph nodes of multiple sites: Secondary | ICD-10-CM

## 2020-01-04 MED ORDER — SODIUM CHLORIDE 0.9 % IV SOLN
10.0000 mg | Freq: Once | INTRAVENOUS | Status: AC
  Administered 2020-01-04: 10 mg via INTRAVENOUS
  Filled 2020-01-04: qty 10

## 2020-01-04 MED ORDER — HEPARIN SOD (PORK) LOCK FLUSH 100 UNIT/ML IV SOLN
INTRAVENOUS | Status: AC
  Filled 2020-01-04: qty 5

## 2020-01-04 MED ORDER — HEPARIN SOD (PORK) LOCK FLUSH 100 UNIT/ML IV SOLN
500.0000 [IU] | Freq: Once | INTRAVENOUS | Status: AC | PRN
  Administered 2020-01-04: 500 [IU]
  Filled 2020-01-04: qty 5

## 2020-01-04 MED ORDER — SODIUM CHLORIDE 0.9 % IV SOLN
90.0000 mg/m2 | Freq: Once | INTRAVENOUS | Status: AC
  Administered 2020-01-04: 150 mg via INTRAVENOUS
  Filled 2020-01-04: qty 6

## 2020-01-04 MED ORDER — SODIUM CHLORIDE 0.9 % IV SOLN
Freq: Once | INTRAVENOUS | Status: AC
  Filled 2020-01-04: qty 250

## 2020-01-05 ENCOUNTER — Inpatient Hospital Stay: Payer: Commercial Managed Care - PPO

## 2020-01-05 DIAGNOSIS — C8238 Follicular lymphoma grade IIIa, lymph nodes of multiple sites: Secondary | ICD-10-CM

## 2020-01-05 DIAGNOSIS — Z7189 Other specified counseling: Secondary | ICD-10-CM

## 2020-01-05 DIAGNOSIS — Z5112 Encounter for antineoplastic immunotherapy: Secondary | ICD-10-CM | POA: Diagnosis not present

## 2020-01-05 MED ORDER — PEGFILGRASTIM-CBQV 6 MG/0.6ML ~~LOC~~ SOSY
6.0000 mg | PREFILLED_SYRINGE | Freq: Once | SUBCUTANEOUS | Status: AC
  Administered 2020-01-05: 6 mg via SUBCUTANEOUS
  Filled 2020-01-05: qty 0.6

## 2020-01-11 ENCOUNTER — Inpatient Hospital Stay: Payer: Commercial Managed Care - PPO

## 2020-01-11 ENCOUNTER — Telehealth: Payer: Self-pay | Admitting: *Deleted

## 2020-01-11 ENCOUNTER — Other Ambulatory Visit: Payer: Self-pay | Admitting: *Deleted

## 2020-01-11 ENCOUNTER — Other Ambulatory Visit: Payer: Self-pay

## 2020-01-11 ENCOUNTER — Inpatient Hospital Stay (HOSPITAL_BASED_OUTPATIENT_CLINIC_OR_DEPARTMENT_OTHER): Payer: Commercial Managed Care - PPO | Admitting: Hospice and Palliative Medicine

## 2020-01-11 ENCOUNTER — Encounter: Payer: Self-pay | Admitting: Hospice and Palliative Medicine

## 2020-01-11 VITALS — BP 126/81 | HR 95 | Temp 97.9°F | Resp 16 | Wt 145.0 lb

## 2020-01-11 DIAGNOSIS — C8238 Follicular lymphoma grade IIIa, lymph nodes of multiple sites: Secondary | ICD-10-CM

## 2020-01-11 DIAGNOSIS — R300 Dysuria: Secondary | ICD-10-CM

## 2020-01-11 DIAGNOSIS — M545 Low back pain, unspecified: Secondary | ICD-10-CM

## 2020-01-11 DIAGNOSIS — Z5112 Encounter for antineoplastic immunotherapy: Secondary | ICD-10-CM | POA: Diagnosis not present

## 2020-01-11 DIAGNOSIS — R1031 Right lower quadrant pain: Secondary | ICD-10-CM

## 2020-01-11 LAB — COMPREHENSIVE METABOLIC PANEL
ALT: 21 U/L (ref 0–44)
AST: 21 U/L (ref 15–41)
Albumin: 4.2 g/dL (ref 3.5–5.0)
Alkaline Phosphatase: 128 U/L — ABNORMAL HIGH (ref 38–126)
Anion gap: 7 (ref 5–15)
BUN: 18 mg/dL (ref 6–20)
CO2: 29 mmol/L (ref 22–32)
Calcium: 8.8 mg/dL — ABNORMAL LOW (ref 8.9–10.3)
Chloride: 102 mmol/L (ref 98–111)
Creatinine, Ser: 0.75 mg/dL (ref 0.44–1.00)
GFR, Estimated: >60 mL/min (ref >60)
Glucose, Bld: 104 mg/dL — ABNORMAL HIGH (ref 70–99)
Potassium: 3.8 mmol/L (ref 3.5–5.1)
Sodium: 138 mmol/L (ref 135–145)
Total Bilirubin: 0.7 mg/dL (ref 0.3–1.2)
Total Protein: 6.9 g/dL (ref 6.5–8.1)

## 2020-01-11 LAB — CBC WITH DIFFERENTIAL/PLATELET
Abs Immature Granulocytes: 0.2 10*3/uL — ABNORMAL HIGH (ref 0.00–0.07)
Band Neutrophils: 18 %
Basophils Absolute: 0 10*3/uL (ref 0.0–0.1)
Basophils Relative: 0 %
Eosinophils Absolute: 0.2 10*3/uL (ref 0.0–0.5)
Eosinophils Relative: 2 %
HCT: 34.4 % — ABNORMAL LOW (ref 36.0–46.0)
Hemoglobin: 12.4 g/dL (ref 12.0–15.0)
Lymphocytes Relative: 3 %
Lymphs Abs: 0.3 10*3/uL — ABNORMAL LOW (ref 0.7–4.0)
MCH: 34.3 pg — ABNORMAL HIGH (ref 26.0–34.0)
MCHC: 36 g/dL (ref 30.0–36.0)
MCV: 95 fL (ref 80.0–100.0)
Metamyelocytes Relative: 2 %
Monocytes Absolute: 0.5 10*3/uL (ref 0.1–1.0)
Monocytes Relative: 5 %
Neutro Abs: 7.9 10*3/uL — ABNORMAL HIGH (ref 1.7–7.7)
Neutrophils Relative %: 70 %
Platelets: 138 10*3/uL — ABNORMAL LOW (ref 150–400)
RBC: 3.62 MIL/uL — ABNORMAL LOW (ref 3.87–5.11)
RDW: 15.5 % (ref 11.5–15.5)
Smear Review: NORMAL
WBC: 9 10*3/uL (ref 4.0–10.5)
nRBC: 0 % (ref 0.0–0.2)

## 2020-01-11 LAB — URINALYSIS, COMPLETE (UACMP) WITH MICROSCOPIC
Bacteria, UA: NONE SEEN
Bilirubin Urine: NEGATIVE
Glucose, UA: NEGATIVE mg/dL
Hgb urine dipstick: NEGATIVE
Ketones, ur: NEGATIVE mg/dL
Leukocytes,Ua: NEGATIVE
Nitrite: NEGATIVE
Protein, ur: NEGATIVE mg/dL
Specific Gravity, Urine: 1.01 (ref 1.005–1.030)
pH: 6 (ref 5.0–8.0)

## 2020-01-11 MED ORDER — PREDNISONE 10 MG (21) PO TBPK: ORAL_TABLET | ORAL | 0 refills | Status: DC

## 2020-01-11 MED ORDER — PANTOPRAZOLE SODIUM 40 MG PO TBEC: 40.0000 mg | DELAYED_RELEASE_TABLET | Freq: Every day | ORAL | 3 refills | Status: DC

## 2020-01-11 NOTE — Progress Notes (Signed)
Pt reports pain in right abdominal area, states it radiates around to back and up through right shoulder and down through right heel.Pt rates pain a 9 and has not taken any pain medication.

## 2020-01-11 NOTE — Progress Notes (Signed)
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  Patient is status post 5 cycles of Gazyva/bendamustine with plan for total 6 cycles before reimaging.  Last PET scan on 10/05/2019 revealed significant disease improvement without any clear adenopathy.  Despite treatment response, patient has continued to have vague and atypical symptomatic complaints including abdominal pain.  She has been treated for constipation and GERD without significant improvement.  Most recently, patient was seen by Dr. Hollice Espy on 12/30/2019 for evaluation of chronic urinary frequency, dysuria, and right lower quadrant pain, which failed to respond with Pyridium and with negative UA.  Patient underwent cystoscopy without any acute findings.  Patient presents to the clinic today for evaluation of back pain, which she reports originating from her low back and shooting up her spine and down her right leg to her feet.  Pain is described to be moderate to severe in intensity.  She characterizes it as a burning sensation.  She denies weakness or incontinence.  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  Patient also continues to endorse burning and reflux not improved with daily omeprazole.  Additionally, she endorses chronic constipation and has not had a bowel movement in several days despite taking daily MiraLAX.  Denies any neurologic complaints. Denies recent fevers or illnesses. Denies any easy bleeding or bruising. Reports good appetite and  denies weight loss. Denies chest pain. Denies any nausea, vomiting, or diarrhea. Denies urinary complaints. Patient offers no further specific complaints today.  PAST MEDICAL HISTORY: Past Medical History:  Diagnosis Date  . Anemia 2006  . Anxiety   . Arthritis   . Helicobacter pylori gastritis   . History of hiatal hernia   . Hypertension   . Lymphoma ([place])     PAST SURGICAL HISTORY:  Past Surgical History:  Procedure Laterality Date  . APPENDECTOMY    . CHOLECYSTECTOMY N/A 01/19/2015   Procedure: LAPAROSCOPIC CHOLECYSTECTOMY;  [name], MD;  [name];  Service: General;  [name];  . COLONOSCOPY WITH PROPOFOL N/A 10/27/2014   Procedure: COLONOSCOPY WITH PROPOFOL;  [name], MD;  Location: [facility] ENDOSCOPY;  Service: Endoscopy;  [name];  . ESOPHAGOGASTRODUODENOSCOPY    . EXCISION MASS ABDOMINAL N/A 06/03/2019   Procedure: EXCISION MASS ABDOMINAL;  [name], MD;  [name];  Service: General;  [name];  abdominal node biopsy  . NASAL SINUS SURGERY    . PORTACATH PLACEMENT Left 07/08/2019   Procedure: INSERTION PORT-A-CATH;  [name], MD;  [name];  Service: General;  Laterality: Left;  . TUBAL LIGATION      HEMATOLOGY/ONCOLOGY HISTORY:  Oncology History Overview Note  # 5th FEB 2021- ABDOMINAL MASS-  9.3 x 3.0 cm central mesenteric soft tissue mass is identified on image 38/series 2. 3.0x 3.0 cm collar of soft tissue surrounds branches of the superior mesenteric artery and vein on image 47/2 with relatively little mass-effect on the vascular anatomy. 2.0 x 1.5 cm nodular component of this abnormal soft  tissue is identified in the mesentery of the right pelvis on 55/2. FEB 2021- PET-  PET scan-February 2021-SUV around 5.5 again highly suggestive of malignancy lymphoma.  Small cluster left supra pelvic lymph node/small retroperitoneal lymph nodes-5 mm-Douville  score 3.   # MARCH 1517- grade 3A follicular lymphoma [Open biopsy Dr. 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 ([place])  06/10/2019 Initial Diagnosis   Grade 3a follicular lymphoma of lymph nodes of multiple regions ([place])   07/11/2019 -  Chemotherapy   The patient had dexamethasone (DECADRON) 4 MG tablet, 8 mg, Oral, Daily, 1 of 1 cycle, Start date: --, [name], 0.25 mg, Intravenous,  Once, 6 of 6 cycles Administration: 0.25 mg (07/11/2019), 0.25 mg (08/22/2019), 0.25 mg (09/22/2019), 0.25 mg (11/28/2019), 0.25 mg (01/03/2020) pegfilgrastim-cbqv (UDENYCA) injection 6 mg, 6 mg, Subcutaneous, Once, 3 of 3 cycles Administration: 6 mg (11/30/2019), 6 mg (01/05/2020) bendamustine (BENDEKA) 150 mg in sodium chloride 0.9 % 50 mL (2.6786 mg/mL) chemo infusion, 90 mg/m2 = 150 mg, Intravenous,  Once, [name]50 mg (07/11/2019), 150 mg (07/12/2019), 150 mg (08/22/2019), 150 mg (08/24/2019), 150 mg (09/22/2019), 150 mg (09/23/2019), 150 mg (11/28/2019), 150 mg (11/29/2019), 150 mg (01/03/2020), 150 mg (01/04/2020) obinutuzumab (GAZYVA) 1,[name], 1,000 mg, Intravenous, Once, [name],000 mg (07/11/2019), 1,000 mg (07/18/2019), 1,000 mg (08/22/2019), 1,000 mg (07/27/2019), 1,000 mg (09/22/2019), 1,000 mg (11/28/2019), 1,000 mg (01/03/2020)  for chemotherapy treatment.      ALLERGIES:  has No Known Allergies.  MEDICATIONS:  Current Outpatient Medications  Medication Sig Dispense Refill  . hydrochlorothiazide (HYDRODIURIL) 12.5 MG tablet Take 12.5 mg by mouth daily.    Marland Kitchen levocetirizine (XYZAL) 5 MG tablet Take 5 mg by mouth every evening.    Marland Kitchen losartan (COZAAR) 50 MG tablet Take 50 mg by mouth daily.    . meloxicam (MOBIC) 7.5 MG tablet TAKE 1 TABLET(7.5 MG) BY  MOUTH DAILY 30 tablet 0  . mirabegron ER (MYRBETRIQ) 25 MG TB24 tablet Take 1 tablet (25 mg total) by mouth daily. 30 tablet 11  . montelukast (SINGULAIR) 10 MG tablet Take 1 tablet (10 mg total) by mouth at bedtime. 30 tablet 3  . pantoprazole (PROTONIX) 20 MG tablet Take 20 mg by mouth daily.     No current facility-administered medications for this visit.   Facility-Administered Medications Ordered in Other Visits  Medication Dose Route Frequency Provider Last Rate Last Admin  . sodium chloride flush (NS) 0.9 % injection 10 mL  10 mL Intravenous PRN [name], MD   10 mL at 12/28/19 0815    VITAL SIGNS: LMP 01/18/2005  There were no vitals filed for this visit.  Estimated body mass index is 28.83 kg/m as calculated from the following:   Height as of 12/30/19: 5' (1.524 m).   Weight as of 01/03/20: 147 lb 9.6 oz (67 kg).  LABS: CBC:    Component Value Date/Time   WBC 8.1 12/28/2019 0815   HGB 12.6 12/28/2019 0815   HCT 35.2 (L) 12/28/2019 0815   PLT 271 12/28/2019 0815   MCV 94.6 12/28/2019 0815   NEUTROABS 7.6 12/28/2019 0815   LYMPHSABS 0.2 (L) 12/28/2019 0815   MONOABS 0.3 12/28/2019 0815   EOSABS 0.0 12/28/2019 0815  BASOSABS 0.0 12/28/2019 0815   Comprehensive Metabolic Panel:    Component Value Date/Time   NA 137 12/28/2019 0815   K 3.4 (L) 12/28/2019 0815   CL 100 12/28/2019 0815   CO2 25 12/28/2019 0815   BUN 20 12/28/2019 0815   CREATININE 0.61 12/28/2019 0815   GLUCOSE 147 (H) 12/28/2019 0815   CALCIUM 9.0 12/28/2019 0815   AST 21 12/28/2019 0815   ALT 25 12/28/2019 0815   ALKPHOS 84 12/28/2019 0815   BILITOT 0.5 12/28/2019 0815   PROT 7.6 12/28/2019 0815   ALBUMIN 4.4 12/28/2019 0815    RADIOGRAPHIC STUDIES: No results found.  PERFORMANCE STATUS (ECOG) : 1 - Symptomatic but completely ambulatory  Review of Systems Unless otherwise noted, a complete review of systems is negative.  Physical Exam General: NAD Cardiovascular: regular  rate and rhythm Pulmonary: clear ant fields Abdomen: soft, nontender, + bowel sounds GU: no suprapubic tenderness, no CVA tenderness Extremities: no edema, no joint deformities Skin: no rashes Neurological: Grossly nonfocal  Assessment and Plan- Patient is a 59 y.o. female grade A follicular lymphoma on Obinutuzumab who presents to the clinic today for evaluation of back pain.  Patient was seen with assistance of an interpreter.  Back pain -labs were grossly unchanged from baseline.  UA was normal.  Suspect that patient may have radiculopathy due to musculoskeletal injury.  It is not entirely clear that her symptoms today are markedly different from her chronic pains.  She has an upcoming appointment on 10/18 to see orthopedics.  Patient instructed to keep that appointment.  Will defer further work-up or imaging to them.  In interim, will start short course of oral steroids.  Patient was previously taking meloxicam but is no longer on this.  She was instructed not to take meloxicam concurrently with steroids.  GERD -will increase dose of Protonix to 40 mg daily  Constipation -suggested increasing her MiraLAX to twice daily with as needed use of senna.  Would aim for a bowel movement at least every other day.  Case and plan discussed with Dr. Rogue Bussing    Patient expressed understanding and was in agreement with this plan. She also understands that She can call clinic at any time with any questions, concerns, or complaints.   Thank you for allowing me to participate in the care of this very pleasant patient.   Time Total: 25 minutes  Visit consisted of counseling and education dealing with the complex and emotionally intense issues of symptom management and palliative care in the setting of serious and potentially life-threatening illness.Greater than 50%  of this time was spent counseling and coordinating care related to the above assessment and plan.  Signed by: Altha Harm, PhD,  NP-C

## 2020-01-11 NOTE — Telephone Encounter (Signed)
Patient evaluated in [facility] today

## 2020-01-11 NOTE — Telephone Encounter (Signed)
Nurse returned call to interpreter and emergency contact [name], who stated pt was at work and could not come into symptom management clinic until later this afternoon. [name] stated that pt told her she having pain in her back, down the back of her right leg and it was a burning sensation.  [name] is calling pt at work and seeing if she can come into clinic at 2pm today and [name] verbalized understanding that she needed to call nurse back so appointment could be made for pt.

## 2020-01-11 NOTE — Telephone Encounter (Signed)
[name] called reporting that patient started having pain from navel around side to back last night and that she had a fever, but did not check to see how high it was and does not feel feverish this morning. She is still having pain, but refused to go to ER last night. She has not taken anything over the counter for the pain and has no prescription pain medicine at home. No diarrhea, voiding normal. No vomiting. Asking for something to be done. Please advise

## 2020-01-13 LAB — URINE CULTURE: Culture: <10000 — AB

## 2020-01-30 ENCOUNTER — Other Ambulatory Visit: Payer: Self-pay

## 2020-01-30 ENCOUNTER — Other Ambulatory Visit: Payer: Self-pay | Admitting: Internal Medicine

## 2020-01-30 DIAGNOSIS — C8238 Follicular lymphoma grade IIIa, lymph nodes of multiple sites: Secondary | ICD-10-CM

## 2020-01-31 ENCOUNTER — Ambulatory Visit: Payer: Commercial Managed Care - PPO | Admitting: Internal Medicine

## 2020-01-31 ENCOUNTER — Ambulatory Visit: Payer: Commercial Managed Care - PPO

## 2020-01-31 ENCOUNTER — Other Ambulatory Visit: Payer: Commercial Managed Care - PPO

## 2020-02-01 ENCOUNTER — Encounter: Payer: Self-pay | Admitting: Internal Medicine

## 2020-02-01 ENCOUNTER — Inpatient Hospital Stay (HOSPITAL_BASED_OUTPATIENT_CLINIC_OR_DEPARTMENT_OTHER): Payer: Commercial Managed Care - PPO | Admitting: Internal Medicine

## 2020-02-01 ENCOUNTER — Inpatient Hospital Stay: Payer: Commercial Managed Care - PPO | Attending: Internal Medicine

## 2020-02-01 ENCOUNTER — Ambulatory Visit: Payer: Commercial Managed Care - PPO

## 2020-02-01 ENCOUNTER — Other Ambulatory Visit: Payer: Self-pay

## 2020-02-01 ENCOUNTER — Inpatient Hospital Stay: Payer: Commercial Managed Care - PPO

## 2020-02-01 DIAGNOSIS — R5381 Other malaise: Secondary | ICD-10-CM | POA: Insufficient documentation

## 2020-02-01 DIAGNOSIS — F419 Anxiety disorder, unspecified: Secondary | ICD-10-CM | POA: Insufficient documentation

## 2020-02-01 DIAGNOSIS — M1990 Unspecified osteoarthritis, unspecified site: Secondary | ICD-10-CM | POA: Insufficient documentation

## 2020-02-01 DIAGNOSIS — Z5189 Encounter for other specified aftercare: Secondary | ICD-10-CM | POA: Insufficient documentation

## 2020-02-01 DIAGNOSIS — C8238 Follicular lymphoma grade IIIa, lymph nodes of multiple sites: Secondary | ICD-10-CM | POA: Insufficient documentation

## 2020-02-01 DIAGNOSIS — I10 Essential (primary) hypertension: Secondary | ICD-10-CM | POA: Diagnosis not present

## 2020-02-01 DIAGNOSIS — Z5112 Encounter for antineoplastic immunotherapy: Secondary | ICD-10-CM | POA: Diagnosis not present

## 2020-02-01 DIAGNOSIS — Z9221 Personal history of antineoplastic chemotherapy: Secondary | ICD-10-CM | POA: Insufficient documentation

## 2020-02-01 DIAGNOSIS — D702 Other drug-induced agranulocytosis: Secondary | ICD-10-CM

## 2020-02-01 DIAGNOSIS — R5383 Other fatigue: Secondary | ICD-10-CM | POA: Insufficient documentation

## 2020-02-01 DIAGNOSIS — Z79899 Other long term (current) drug therapy: Secondary | ICD-10-CM | POA: Diagnosis not present

## 2020-02-01 LAB — COMPREHENSIVE METABOLIC PANEL
ALT: 27 U/L (ref 0–44)
AST: 25 U/L (ref 15–41)
Albumin: 3.8 g/dL (ref 3.5–5.0)
Alkaline Phosphatase: 74 U/L (ref 38–126)
Anion gap: 8 (ref 5–15)
BUN: 12 mg/dL (ref 6–20)
CO2: 25 mmol/L (ref 22–32)
Calcium: 8.9 mg/dL (ref 8.9–10.3)
Chloride: 106 mmol/L (ref 98–111)
Creatinine, Ser: 0.59 mg/dL (ref 0.44–1.00)
GFR, Estimated: >60 mL/min (ref >60)
Glucose, Bld: 105 mg/dL — ABNORMAL HIGH (ref 70–99)
Potassium: 3.6 mmol/L (ref 3.5–5.1)
Sodium: 139 mmol/L (ref 135–145)
Total Bilirubin: 0.6 mg/dL (ref 0.3–1.2)
Total Protein: 6.8 g/dL (ref 6.5–8.1)

## 2020-02-01 LAB — CBC WITH DIFFERENTIAL/PLATELET
Abs Immature Granulocytes: 0.01 10*3/uL (ref 0.00–0.07)
Basophils Absolute: 0 10*3/uL (ref 0.0–0.1)
Basophils Relative: 1 %
Eosinophils Absolute: 0.1 10*3/uL (ref 0.0–0.5)
Eosinophils Relative: 5 %
HCT: 33.7 % — ABNORMAL LOW (ref 36.0–46.0)
Hemoglobin: 11.7 g/dL — ABNORMAL LOW (ref 12.0–15.0)
Immature Granulocytes: 1 %
Lymphocytes Relative: 27 %
Lymphs Abs: 0.6 10*3/uL — ABNORMAL LOW (ref 0.7–4.0)
MCH: 34.3 pg — ABNORMAL HIGH (ref 26.0–34.0)
MCHC: 34.7 g/dL (ref 30.0–36.0)
MCV: 98.8 fL (ref 80.0–100.0)
Monocytes Absolute: 0.6 10*3/uL (ref 0.1–1.0)
Monocytes Relative: 26 %
Neutro Abs: 0.9 10*3/uL — ABNORMAL LOW (ref 1.7–7.7)
Neutrophils Relative %: 40 %
Platelets: 202 10*3/uL (ref 150–400)
RBC: 3.41 MIL/uL — ABNORMAL LOW (ref 3.87–5.11)
RDW: 14.2 % (ref 11.5–15.5)
WBC: 2.2 10*3/uL — ABNORMAL LOW (ref 4.0–10.5)
nRBC: 0 % (ref 0.0–0.2)

## 2020-02-01 LAB — LACTATE DEHYDROGENASE: LDH: 164 U/L (ref 98–192)

## 2020-02-01 MED ORDER — HEPARIN SOD (PORK) LOCK FLUSH 100 UNIT/ML IV SOLN
500.0000 [IU] | Freq: Once | INTRAVENOUS | Status: AC
  Administered 2020-02-01: 500 [IU]
  Filled 2020-02-01: qty 5

## 2020-02-01 MED ORDER — FILGRASTIM-SNDZ 480 MCG/0.8ML IJ SOSY
480.0000 ug | PREFILLED_SYRINGE | Freq: Once | INTRAMUSCULAR | Status: AC
  Administered 2020-02-01: 480 ug via SUBCUTANEOUS
  Filled 2020-02-01: qty 0.8

## 2020-02-01 MED ORDER — HEPARIN SOD (PORK) LOCK FLUSH 100 UNIT/ML IV SOLN
INTRAVENOUS | Status: AC
  Filled 2020-02-01: qty 5

## 2020-02-01 NOTE — Progress Notes (Signed)
[place] NOTE  Patient Care Team: Center, [facility] as PCP - General ([place]) [name], MD as Consulting Physician (Hematology and Oncology)  CHIEF COMPLAINTS/PURPOSE OF CONSULTATION: Lymphoma   Oncology History Overview Note  # 5th FEB 2021- ABDOMINAL MASS-  9.3 x 3.0 cm central mesenteric soft tissue mass is identified on image 38/series 2. 3.0x 3.0 cm collar of soft tissue surrounds branches of the superior mesenteric artery and vein on image 47/2 with relatively little mass-effect on the vascular anatomy. 2.0 x 1.5 cm nodular component of this abnormal soft tissue is identified in the mesentery of the right pelvis on 55/2. FEB 2021- PET-  PET scan-February 2021-SUV around 5.5 again highly suggestive of malignancy lymphoma.  Small cluster left supra pelvic lymph node/small retroperitoneal lymph nodes-5 mm-Douville score 3.   # MARCH 6468- grade 3A follicular lymphoma [Open biopsy Dr. 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 ([place])  06/10/2019 Initial Diagnosis   Grade 3a follicular lymphoma of lymph nodes of multiple regions ([place])   07/11/2019 -  Chemotherapy   The patient had dexamethasone (DECADRON) 4 MG tablet, 8 mg, Oral, Daily, 1 of 1 cycle, Start date: --, [name], 0.25 mg, Intravenous,  Once, 6 of 6 cycles Administration: 0.25 mg (07/11/2019), 0.25 mg (08/22/2019), 0.25 mg (09/22/2019), 0.25 mg (11/28/2019), 0.25 mg (01/03/2020) pegfilgrastim-cbqv (UDENYCA) injection 6 mg, 6 mg, Subcutaneous, Once, 3 of 3 cycles Administration: 6 mg (11/30/2019), 6 mg (01/05/2020) bendamustine (BENDEKA) 150 mg in sodium chloride 0.9 % 50 mL (2.6786 mg/mL) chemo infusion, 90 mg/m2 = 150 mg, Intravenous,  Once, [name]50 mg (07/11/2019), 150 mg (07/12/2019),  150 mg (08/22/2019), 150 mg (08/24/2019), 150 mg (09/22/2019), 150 mg (09/23/2019), 150 mg (11/28/2019), 150 mg (11/29/2019), 150 mg (01/03/2020), 150 mg (01/04/2020) obinutuzumab (GAZYVA) 1,[name], 1,000 mg, Intravenous, Once, [name],000 mg (07/11/2019), 1,000 mg (07/18/2019), 1,000 mg (08/22/2019), 1,000 mg (07/27/2019), 1,000 mg (09/22/2019), 1,000 mg (11/28/2019), 1,000 mg (01/03/2020)  for chemotherapy treatment.       HISTORY OF PRESENTING ILLNESS: Patient speaks minimal English/patient daughter Kim Contreras in office.  Kim Contreras 59 y.o.  female with follicular lymphoma grade 3 currently s/p cycle # 5 of Gazyva 4 weeks ago is here for follow-up.  Patient also received growth factor/after her cycle #5.  No fever no chills.  Chronic back pain joint pains not any worse.  Review of Systems  Constitutional: Positive for malaise/fatigue. Negative for chills, diaphoresis, fever and weight loss.  HENT: Negative for nosebleeds and sore throat.   Eyes: Negative for double vision.  Respiratory: Negative for cough, hemoptysis, sputum production, shortness of breath and wheezing.   Cardiovascular: Negative for chest pain, palpitations, orthopnea and leg swelling.  Gastrointestinal: Positive for abdominal pain. Negative for blood in stool, constipation, diarrhea, heartburn, melena and vomiting.  Musculoskeletal: Positive for back pain and joint pain.  Skin: Negative.  Negative for itching and rash.  Neurological: Positive for tingling and headaches. Negative for dizziness, focal weakness and weakness.  Endo/Heme/Allergies: Does not bruise/bleed easily.  Psychiatric/Behavioral: Negative for depression. The patient has insomnia. The patient is not nervous/anxious.      MEDICAL HISTORY:  Past Medical History:  Diagnosis Date  .  Anemia 2006  . Anxiety   . Arthritis   . Helicobacter pylori gastritis   . History of hiatal  hernia   . Hypertension   . Lymphoma ([place])     SURGICAL HISTORY: Past Surgical History:  Procedure Laterality Date  . APPENDECTOMY    . CHOLECYSTECTOMY N/A 01/19/2015   Procedure: LAPAROSCOPIC CHOLECYSTECTOMY;  [name], MD;  [name];  Service: General;  [name];  . COLONOSCOPY WITH PROPOFOL N/A 10/27/2014   Procedure: COLONOSCOPY WITH PROPOFOL;  [name], MD;  Location: [facility] ENDOSCOPY;  Service: Endoscopy;  [name];  . ESOPHAGOGASTRODUODENOSCOPY    . EXCISION MASS ABDOMINAL N/A 06/03/2019   Procedure: EXCISION MASS ABDOMINAL;  [name], MD;  [name];  Service: General;  [name];  abdominal node biopsy  . NASAL SINUS SURGERY    . PORTACATH PLACEMENT Left 07/08/2019   Procedure: INSERTION PORT-A-CATH;  [name], MD;  [name];  Service: General;  Laterality: Left;  . TUBAL LIGATION      SOCIAL HISTORY: Social History   Socioeconomic History  . Marital status: Married    Spouse name: Not on file  . Number of children: Not on file  . Years of education: Not on file  . Highest education level: Not on file  Occupational History  . Not on file  Tobacco Use  . Smoking status: Never Smoker  . Smokeless tobacco: Never Used  Substance and Sexual Activity  . Alcohol use: No  . Drug use: No  . Sexual activity: Not on file  Other Topics Concern  . Not on file  Social History Narrative  . Not on file   Social Determinants of Health   Financial Resource Strain:   . Difficulty of Paying Living Expenses: Not on file  Food Insecurity:   . Worried About Charity fundraiser in the Last Year: Not on file  . Ran Out of Food in the Last Year: Not on file  Transportation Needs:   . Lack of Transportation (Medical): Not on file  . Lack of Transportation (Non-Medical): Not on file  Physical Activity:   . Days of Exercise per Week: Not on file  . Minutes of  Exercise per Session: Not on file  Stress:   . Feeling of Stress : Not on file  Social Connections:   . Frequency of Communication with Friends and Family: Not on file  . Frequency of Social Gatherings with Friends and Family: Not on file  . Attends Religious Services: Not on file  . Active Member of Clubs or Organizations: Not on file  . Attends Archivist Meetings: Not on file  . Marital Status: Not on file  Intimate Partner Violence:   . Fear of Current or Ex-Partner: Not on file  . Emotionally Abused: Not on file  . Physically Abused: Not on file  . Sexually Abused: Not on file    FAMILY HISTORY: Family History  Problem Relation Age of Onset  . Breast cancer Other 42  . Prostate cancer Neg Hx   . Bladder Cancer Neg Hx   . Kidney cancer Neg Hx     ALLERGIES:  has No Known Allergies.  MEDICATIONS:  Current Outpatient Medications  Medication Sig Dispense Refill  . gabapentin (NEURONTIN) 100 MG capsule Take by mouth.    . hydrochlorothiazide (HYDRODIURIL) 12.5 MG tablet Take 12.5 mg by mouth daily.    Marland Kitchen levocetirizine (XYZAL) 5  MG tablet Take 5 mg by mouth every evening.    Marland Kitchen losartan (COZAAR) 50 MG tablet Take 50 mg by mouth daily.    . meloxicam (MOBIC) 7.5 MG tablet TAKE 1 TABLET(7.5 MG) BY MOUTH DAILY 30 tablet 0  . mirabegron ER (MYRBETRIQ) 25 MG TB24 tablet Take 1 tablet (25 mg total) by mouth daily. 30 tablet 11  . pantoprazole (PROTONIX) 40 MG tablet Take 1 tablet (40 mg total) by mouth daily. 30 tablet 3   No current facility-administered medications for this visit.   Facility-Administered Medications Ordered in Other Visits  Medication Dose Route Frequency Provider Last Rate Last Admin  . sodium chloride flush (NS) 0.9 % injection 10 mL  10 mL Intravenous PRN [name], MD   10 mL at 12/28/19 0815      .  PHYSICAL EXAMINATION: ECOG PERFORMANCE STATUS: 0 - Asymptomatic  Vitals:   02/01/20 0828  BP: 130/80  Pulse: 63  Resp: 16   Temp: (!) 97.5 F (36.4 C)  SpO2: 100%   Filed Weights   02/01/20 0828  Weight: 147 lb (66.7 kg)    Physical Exam HENT:     Head: Normocephalic and atraumatic.     Mouth/Throat:     Pharynx: No oropharyngeal exudate.  Eyes:     Pupils: Pupils are equal, round, and reactive to light.  Cardiovascular:     Rate and Rhythm: Normal rate and regular rhythm.  Pulmonary:     Effort: Pulmonary effort is normal. No respiratory distress.     Breath sounds: Normal breath sounds. No wheezing.  Abdominal:     General: Bowel sounds are normal. There is no distension.     Palpations: Abdomen is soft. There is no mass.     Tenderness: There is no abdominal tenderness. There is no guarding or rebound.  Musculoskeletal:        General: No tenderness. Normal range of motion.     Cervical back: Normal range of motion and neck supple.     Comments: Mild swelling of the left lower extremity; mild tenderness noted.  Skin:    General: Skin is warm.  Neurological:     Mental Status: She is alert and oriented to person, place, and time.  Psychiatric:        Mood and Affect: Affect normal.      LABORATORY DATA:  I have reviewed the data as listed Lab Results  Component Value Date   WBC 2.2 (L) 02/01/2020   HGB 11.7 (L) 02/01/2020   HCT 33.7 (L) 02/01/2020   MCV 98.8 02/01/2020   PLT 202 02/01/2020   Recent Labs    10/19/19 0813 10/19/19 0813 11/02/19 0815 11/21/19 0818 11/28/19 3716 11/28/19 0814 12/28/19 0815 01/11/20 1355 02/01/20 0815  NA  --    < >   < > 137 140   < > 137 138 139  K  --    < >   < > 3.9 3.6   < > 3.4* 3.8 3.6  CL  --    < >   < > 105 105   < > 100 102 106  CO2  --    < >   < > 24 24   < > 25 29 25   GLUCOSE  --    < >   < > 105* 136*   < > 147* 104* 105*  BUN  --    < >   < > 23* 21*   < >  20 18 12   CREATININE  --    < >   < > 0.79 0.86   < > 0.61 0.75 0.59  CALCIUM  --    < >   < > 8.9 9.0   < > 9.0 8.8* 8.9  GFRNONAA  --    < >   < > >60 >60   < > >60  >60 >60  GFRAA  --   --    < > >60 >60  --  >60  --   --   PROT 7.4   < >  --   --  7.7   < > 7.6 6.9 6.8  ALBUMIN 4.1   < >  --   --  4.3   < > 4.4 4.2 3.8  AST 34   < >  --   --  30   < > 21 21 25   ALT 41   < >  --   --  44   < > 25 21 27   ALKPHOS 85   < >  --   --  178*   < > 84 128* 74  BILITOT 0.6   < >  --   --  0.6   < > 0.5 0.7 0.6  BILIDIR <0.1  --   --   --   --   --   --   --   --   IBILI NOT CALCULATED  --   --   --   --   --   --   --   --    < > = values in this interval not displayed.    RADIOGRAPHIC STUDIES: I have personally reviewed the radiological images as listed and agreed with the findings in the report. No results found.  ASSESSMENT & PLAN:   Grade 3a follicular lymphoma of lymph nodes of multiple regions [facility]) #Follicular lymphoma grade 3; stage II [versus stage III-eqivocal neck lymph nodes]. On Obi-benda.  October 04, 2019-PET scan improved response; with significant resolution of the abdominal/retroperitoneal lymph nodes- STABLE.   # HOLD  Obi-Benda today# 6 Labs today reviewed; ANC 0.9; NOT Acceptable for treatments. Plan zaxrio daily x 3 days. Pt also received udencay with cycle # 5.   # constipation/ abodminal pain/reflux- continue miralax every day. STABLE  # Joint pains/sec to growth factors-continue meloxicam; add claritin daily OTC; STABLE..   # DISPOSITION: # HOLD chemo this week; de-access; cancel udenyca this week # Zarxio- today; 11-04/11-05 # Follow up in 1 week- ;MD; labs- cbc/cmp;LDH; Obi-Benda; D-2 Oneal Grout; D-3 Ellen Henri- Dr.B    All questions were answered. The patient knows to call the clinic with any problems, questions or concerns.    [name], MD 02/06/2020 4:08 PM

## 2020-02-01 NOTE — Assessment & Plan Note (Addendum)
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 On Obi-benda.  October 04, 2019-PET scan improved response; with significant resolution of the abdominal/retroperitoneal lymph nodes- STABLE.   # HOLD  Obi-Benda today# 6 Labs today reviewed; ANC 0.9; NOT Acceptable for treatments. Plan zaxrio daily x 3 days. Pt also received udencay with cycle # 5.   # constipation/ abodminal pain/reflux- continue miralax every day. STABLE  # Joint pains/sec to growth factors-continue meloxicam; add claritin daily OTC; STABLE..   # DISPOSITION: # HOLD chemo this week; de-access; cancel udenyca this week # Zarxio- today; 11-04/11-05 # Follow up in 1 week- ;MD; labs- cbc/cmp;LDH; Obi-Benda; D-2 Oneal Grout; D-3 Ellen Henri- Dr.B

## 2020-02-02 ENCOUNTER — Inpatient Hospital Stay: Payer: Commercial Managed Care - PPO

## 2020-02-02 ENCOUNTER — Ambulatory Visit: Payer: Commercial Managed Care - PPO

## 2020-02-02 DIAGNOSIS — D702 Other drug-induced agranulocytosis: Secondary | ICD-10-CM

## 2020-02-02 DIAGNOSIS — C8238 Follicular lymphoma grade IIIa, lymph nodes of multiple sites: Secondary | ICD-10-CM

## 2020-02-02 MED ORDER — FILGRASTIM-SNDZ 480 MCG/0.8ML IJ SOSY
480.0000 ug | PREFILLED_SYRINGE | Freq: Once | INTRAMUSCULAR | Status: AC
  Administered 2020-02-02: 480 ug via SUBCUTANEOUS
  Filled 2020-02-02: qty 0.8

## 2020-02-03 ENCOUNTER — Inpatient Hospital Stay: Payer: Commercial Managed Care - PPO

## 2020-02-03 DIAGNOSIS — C8238 Follicular lymphoma grade IIIa, lymph nodes of multiple sites: Secondary | ICD-10-CM | POA: Diagnosis not present

## 2020-02-03 DIAGNOSIS — D702 Other drug-induced agranulocytosis: Secondary | ICD-10-CM

## 2020-02-03 MED ORDER — FILGRASTIM-SNDZ 480 MCG/0.8ML IJ SOSY
480.0000 ug | PREFILLED_SYRINGE | Freq: Once | INTRAMUSCULAR | Status: AC
  Administered 2020-02-03: 480 ug via SUBCUTANEOUS
  Filled 2020-02-03: qty 0.8

## 2020-02-09 ENCOUNTER — Inpatient Hospital Stay: Payer: Commercial Managed Care - PPO

## 2020-02-09 ENCOUNTER — Ambulatory Visit: Payer: Commercial Managed Care - PPO

## 2020-02-09 ENCOUNTER — Other Ambulatory Visit: Payer: Self-pay

## 2020-02-09 ENCOUNTER — Encounter: Payer: Self-pay | Admitting: Internal Medicine

## 2020-02-09 ENCOUNTER — Inpatient Hospital Stay (HOSPITAL_BASED_OUTPATIENT_CLINIC_OR_DEPARTMENT_OTHER): Payer: Commercial Managed Care - PPO | Admitting: Internal Medicine

## 2020-02-09 VITALS — BP 137/88 | HR 78 | Temp 97.3°F | Resp 20 | Wt 145.6 lb

## 2020-02-09 DIAGNOSIS — C8238 Follicular lymphoma grade IIIa, lymph nodes of multiple sites: Secondary | ICD-10-CM | POA: Diagnosis not present

## 2020-02-09 DIAGNOSIS — Z7189 Other specified counseling: Secondary | ICD-10-CM

## 2020-02-09 LAB — CBC WITH DIFFERENTIAL/PLATELET
Abs Immature Granulocytes: 0.06 10*3/uL (ref 0.00–0.07)
Basophils Absolute: 0 10*3/uL (ref 0.0–0.1)
Basophils Relative: 0 %
Eosinophils Absolute: 0 10*3/uL (ref 0.0–0.5)
Eosinophils Relative: 0 %
HCT: 36.6 % (ref 36.0–46.0)
Hemoglobin: 12.8 g/dL (ref 12.0–15.0)
Immature Granulocytes: 3 %
Lymphocytes Relative: 16 %
Lymphs Abs: 0.4 10*3/uL — ABNORMAL LOW (ref 0.7–4.0)
MCH: 34.2 pg — ABNORMAL HIGH (ref 26.0–34.0)
MCHC: 35 g/dL (ref 30.0–36.0)
MCV: 97.9 fL (ref 80.0–100.0)
Monocytes Absolute: 0.1 10*3/uL (ref 0.1–1.0)
Monocytes Relative: 5 %
Neutro Abs: 1.9 10*3/uL (ref 1.7–7.7)
Neutrophils Relative %: 76 %
Platelets: 248 10*3/uL (ref 150–400)
RBC: 3.74 MIL/uL — ABNORMAL LOW (ref 3.87–5.11)
RDW: 14.1 % (ref 11.5–15.5)
WBC: 2.4 10*3/uL — ABNORMAL LOW (ref 4.0–10.5)
nRBC: 0 % (ref 0.0–0.2)

## 2020-02-09 LAB — COMPREHENSIVE METABOLIC PANEL
ALT: 27 U/L (ref 0–44)
AST: 23 U/L (ref 15–41)
Albumin: 4.5 g/dL (ref 3.5–5.0)
Alkaline Phosphatase: 96 U/L (ref 38–126)
Anion gap: 10 (ref 5–15)
BUN: 21 mg/dL — ABNORMAL HIGH (ref 6–20)
CO2: 23 mmol/L (ref 22–32)
Calcium: 9.5 mg/dL (ref 8.9–10.3)
Chloride: 105 mmol/L (ref 98–111)
Creatinine, Ser: 0.73 mg/dL (ref 0.44–1.00)
GFR, Estimated: >60 mL/min (ref >60)
Glucose, Bld: 135 mg/dL — ABNORMAL HIGH (ref 70–99)
Potassium: 3.9 mmol/L (ref 3.5–5.1)
Sodium: 138 mmol/L (ref 135–145)
Total Bilirubin: 0.9 mg/dL (ref 0.3–1.2)
Total Protein: 7.7 g/dL (ref 6.5–8.1)

## 2020-02-09 LAB — LACTATE DEHYDROGENASE: LDH: 245 U/L — ABNORMAL HIGH (ref 98–192)

## 2020-02-09 MED ORDER — SODIUM CHLORIDE 0.9 % IV SOLN
1000.0000 mg | Freq: Once | INTRAVENOUS | Status: DC
  Filled 2020-02-09: qty 40

## 2020-02-09 MED ORDER — PALONOSETRON HCL INJECTION 0.25 MG/5ML
0.2500 mg | Freq: Once | INTRAVENOUS | Status: AC
  Administered 2020-02-09: 0.25 mg via INTRAVENOUS
  Filled 2020-02-09: qty 5

## 2020-02-09 MED ORDER — SODIUM CHLORIDE 0.9 % IV SOLN
90.0000 mg/m2 | Freq: Once | INTRAVENOUS | Status: AC
  Administered 2020-02-09: 150 mg via INTRAVENOUS
  Filled 2020-02-09: qty 6

## 2020-02-09 MED ORDER — ACETAMINOPHEN 325 MG PO TABS: 650.0000 mg | ORAL_TABLET | Freq: Once | ORAL | Status: DC

## 2020-02-09 MED ORDER — ACETAMINOPHEN 325 MG PO TABS
650.0000 mg | ORAL_TABLET | Freq: Once | ORAL | Status: AC
  Administered 2020-02-09: 650 mg via ORAL
  Filled 2020-02-09: qty 2

## 2020-02-09 MED ORDER — SODIUM CHLORIDE 0.9 % IV SOLN
Freq: Once | INTRAVENOUS | Status: AC
  Filled 2020-02-09: qty 250

## 2020-02-09 MED ORDER — SODIUM CHLORIDE 0.9 % IV SOLN
10.0000 mg | Freq: Once | INTRAVENOUS | Status: AC
  Administered 2020-02-09: 10 mg via INTRAVENOUS
  Filled 2020-02-09: qty 10

## 2020-02-09 MED ORDER — DIPHENHYDRAMINE HCL 50 MG/ML IJ SOLN: 50.0000 mg | Freq: Once | INTRAMUSCULAR | Status: DC

## 2020-02-09 MED ORDER — HEPARIN SOD (PORK) LOCK FLUSH 100 UNIT/ML IV SOLN
500.0000 [IU] | Freq: Once | INTRAVENOUS | Status: AC | PRN
  Administered 2020-02-09: 500 [IU]
  Filled 2020-02-09: qty 5

## 2020-02-09 MED ORDER — SODIUM CHLORIDE 0.9 % IV SOLN
10.0000 mg | Freq: Once | INTRAVENOUS | Status: DC
  Filled 2020-02-09: qty 1

## 2020-02-09 MED ORDER — SODIUM CHLORIDE 0.9% FLUSH
10.0000 mL | Freq: Once | INTRAVENOUS | Status: AC
  Administered 2020-02-09: 10 mL via INTRAVENOUS
  Filled 2020-02-09: qty 10

## 2020-02-09 MED ORDER — SODIUM CHLORIDE 0.9 % IV SOLN
1000.0000 mg | Freq: Once | INTRAVENOUS | Status: AC
  Administered 2020-02-09: 1000 mg via INTRAVENOUS
  Filled 2020-02-09: qty 40

## 2020-02-09 MED ORDER — HEPARIN SOD (PORK) LOCK FLUSH 100 UNIT/ML IV SOLN
INTRAVENOUS | Status: AC
  Filled 2020-02-09: qty 5

## 2020-02-09 MED ORDER — HEPARIN SOD (PORK) LOCK FLUSH 100 UNIT/ML IV SOLN
500.0000 [IU] | Freq: Once | INTRAVENOUS | Status: AC
  Filled 2020-02-09: qty 5

## 2020-02-09 MED ORDER — PALONOSETRON HCL INJECTION 0.25 MG/5ML: 0.2500 mg | Freq: Once | INTRAVENOUS | Status: DC

## 2020-02-09 MED ORDER — SODIUM CHLORIDE 0.9 % IV SOLN
90.0000 mg/m2 | Freq: Once | INTRAVENOUS | Status: DC
  Filled 2020-02-09: qty 6

## 2020-02-09 MED ORDER — DIPHENHYDRAMINE HCL 50 MG/ML IJ SOLN
50.0000 mg | Freq: Once | INTRAMUSCULAR | Status: AC
  Administered 2020-02-09: 50 mg via INTRAVENOUS
  Filled 2020-02-09: qty 1

## 2020-02-09 NOTE — Assessment & Plan Note (Addendum)
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 On Obi-benda.  October 04, 2019-PET scan improved response; with significant resolution of the abdominal/retroperitoneal lymph nodes- STABLE.   # proceed with Obi-Benda today# 6 Labs today reviewed; ANC -1.9; Labs today reviewed;  acceptable for treatment today. Will plan PET after 6 cycles; will order at next visit.   # constipation/ abodminal pain/reflux- continue miralax every day. STABLE.   # Joint pains/sec to growth factors-continue meloxicam; add claritin daily OTC; STABLE  # Allergic conjunctivitis-; new; Start zaditor.   # DISPOSITION: # chemo today; this week; udenyca on 11/15.  # Follow up in 4 weeks- ;MD; labs- cbc/cmp;LDH; on chemo;  Dr.B

## 2020-02-09 NOTE — Patient Instructions (Signed)
#   use zaditor eye drop twice a day [over the counter- anti-histamine eye drops].

## 2020-02-09 NOTE — Progress Notes (Signed)
[place] NOTE  Patient Care Team: Center, [facility] as PCP - General ([place]) [name], MD as Consulting Physician (Hematology and Oncology)  CHIEF COMPLAINTS/PURPOSE OF CONSULTATION: Lymphoma   Oncology History Overview Note  # 5th FEB 2021- ABDOMINAL MASS-  9.3 x 3.0 cm central mesenteric soft tissue mass is identified on image 38/series 2. 3.0x 3.0 cm collar of soft tissue surrounds branches of the superior mesenteric artery and vein on image 47/2 with relatively little mass-effect on the vascular anatomy. 2.0 x 1.5 cm nodular component of this abnormal soft tissue is identified in the mesentery of the right pelvis on 55/2. FEB 2021- PET-  PET scan-February 2021-SUV around 5.5 again highly suggestive of malignancy lymphoma.  Small cluster left supra pelvic lymph node/small retroperitoneal lymph nodes-5 mm-Douville score 3.   # MARCH 4431- grade 3A follicular lymphoma [Open biopsy Dr. 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 ([place])   07/11/2019 -  Chemotherapy   The patient had dexamethasone (DECADRON) 4 MG tablet, 8 mg, Oral, Daily, 1 of 1 cycle, Start date: --, [name], 0.25 mg, Intravenous,  Once, 6 of 6 cycles Administration: 0.25 mg (07/11/2019), 0.25 mg (08/22/2019), 0.25 mg (09/22/2019), 0.25 mg (11/28/2019), 0.25 mg (01/03/2020) pegfilgrastim-cbqv (UDENYCA) injection 6 mg, 6 mg, Subcutaneous, Once, 3 of 3 cycles Administration: 6 mg (11/30/2019), 6 mg (01/05/2020) bendamustine (BENDEKA) 150 mg in sodium chloride 0.9 % 50 mL (2.6786 mg/mL) chemo infusion, 90 mg/m2 = 150 mg, Intravenous,  Once, [name]50 mg (07/11/2019), 150 mg (07/12/2019),  150 mg (08/22/2019), 150 mg (08/24/2019), 150 mg (09/22/2019), 150 mg (09/23/2019), 150 mg (11/28/2019), 150 mg (11/29/2019), 150 mg (01/03/2020), 150 mg (01/04/2020) obinutuzumab (GAZYVA) 1,[name], 1,000 mg, Intravenous, Once, [name],000 mg (07/11/2019), 1,000 mg (07/18/2019), 1,000 mg (08/22/2019), 1,000 mg (07/27/2019), 1,000 mg (09/22/2019), 1,000 mg (11/28/2019), 1,000 mg (01/03/2020)  for chemotherapy treatment.       HISTORY OF PRESENTING ILLNESS: Patient speaks minimal English/patient daughter [name] in office.  Kim Contreras 59 y.o.  female with follicular lymphoma grade 3 currently s/p cycle # 5 of Gazyva 5 weeks ago is here for follow-up.  Patient's chemotherapy was held last week because of neutropenia.  She complains of mild itching in the eyes. No crusting. She has been doing eyedrops-not clear what kind of eyedrops.  Otherwise no fever no chills. No nausea vomiting. Mild headache today.  Review of Systems  Constitutional: Positive for malaise/fatigue. Negative for chills, diaphoresis, fever and weight loss.  HENT: Negative for nosebleeds and sore throat.   Eyes: Negative for double vision.  Respiratory: Negative for cough, hemoptysis, sputum production, shortness of breath and wheezing.   Cardiovascular: Negative for chest pain, palpitations, orthopnea and leg swelling.  Gastrointestinal: Negative for blood in stool, constipation, diarrhea, heartburn, melena and vomiting.  Musculoskeletal: Positive for back pain and joint pain.  Skin: Negative.  Negative for itching and rash.  Neurological: Positive for tingling and headaches. Negative for dizziness, focal weakness and weakness.  Endo/Heme/Allergies: Does not bruise/bleed easily.  Psychiatric/Behavioral: Negative for depression. The patient has insomnia. The patient is not nervous/anxious.  MEDICAL HISTORY:  Past Medical History:  Diagnosis  Date  . Anemia 2006  . Anxiety   . Arthritis   . Helicobacter pylori gastritis   . History of hiatal hernia   . Hypertension   . Lymphoma ([place])     SURGICAL HISTORY: Past Surgical History:  Procedure Laterality Date  . APPENDECTOMY    . CHOLECYSTECTOMY N/A 01/19/2015   Procedure: LAPAROSCOPIC CHOLECYSTECTOMY;  [name], MD;  [name];  Service: General;  [name];  . COLONOSCOPY WITH PROPOFOL N/A 10/27/2014   Procedure: COLONOSCOPY WITH PROPOFOL;  [name], MD;  Location: [facility] END[facility];  Service: Endoscopy;  [name];  . ESOPHAGOGASTRODUODENOSCOPY    . EXCISION MASS ABDOMINAL N/A 06/03/2019   Procedure: EXCISION MASS ABDOMINAL;  [name], MD;  [name];  Service: General;  [name];  abdominal node biopsy  . NASAL SINUS SURGERY    . PORTACATH PLACEMENT Left 07/08/2019   Procedure: INSERTION PORT-A-CATH;  [name], MD;  [name];  Service: General;  Laterality: Left;  . TUBAL LIGATION      SOCIAL HISTORY: Social History   Socioeconomic History  . Marital status: Married    Spouse name: Not on file  . Number of children: Not on file  . Years of education: Not on file  . Highest education level: Not on file  Occupational History  . Not on file  Tobacco Use  . Smoking status: Never Smoker  . Smokeless tobacco: Never Used  Substance and Sexual Activity  . Alcohol use: No  . Drug use: No  . Sexual activity: Not on file  Other Topics Concern  . Not on file  Social History Narrative  . Not on file   Social Determinants of Health   Financial Resource Strain:   . Difficulty of Paying Living Expenses: Not on file  Food Insecurity:   . Worried About Charity fundraiser in the Last Year: Not on file  . Ran Out of Food in the Last Year: Not on file  Transportation Needs:   . Lack of Transportation (Medical): Not on file  . Lack of Transportation  (Non-Medical): Not on file  Physical Activity:   . Days of Exercise per Week: Not on file  . Minutes of Exercise per Session: Not on file  Stress:   . Feeling of Stress : Not on file  Social Connections:   . Frequency of Communication with Friends and Family: Not on file  . Frequency of Social Gatherings with Friends and Family: Not on file  . Attends Religious Services: Not on file  . Active Member of Clubs or Organizations: Not on file  . Attends Archivist Meetings: Not on file  . Marital Status: Not on file  Intimate Partner Violence:   . Fear of Current or Ex-Partner: Not on file  . Emotionally Abused: Not on file  . Physically Abused: Not on file  . Sexually Abused: Not on file    FAMILY HISTORY: Family History  Problem Relation Age of Onset  . Breast cancer Other 43  . Prostate cancer Neg Hx   . Bladder Cancer Neg Hx   . Kidney cancer Neg Hx     ALLERGIES:  has No Known Allergies.  MEDICATIONS:  Current Outpatient Medications  Medication Sig Dispense Refill  . gabapentin (NEURONTIN) 100 MG capsule Take by mouth.    . hydrochlorothiazide (HYDRODIURIL) 12.5 MG tablet Take 12.5  mg by mouth daily.    Marland Kitchen levocetirizine (XYZAL) 5 MG tablet Take 5 mg by mouth every evening.    Marland Kitchen losartan (COZAAR) 50 MG tablet Take 50 mg by mouth daily.    . meloxicam (MOBIC) 7.5 MG tablet TAKE 1 TABLET(7.5 MG) BY MOUTH DAILY 30 tablet 0  . mirabegron ER (MYRBETRIQ) 25 MG TB24 tablet Take 1 tablet (25 mg total) by mouth daily. 30 tablet 11  . pantoprazole (PROTONIX) 40 MG tablet Take 1 tablet (40 mg total) by mouth daily. 30 tablet 3   No current facility-administered medications for this visit.   Facility-Administered Medications Ordered in Other Visits  Medication Dose Route Frequency Provider Last Rate Last Admin  . acetaminophen (TYLENOL) tablet 650 mg  650 mg Oral Once [name], MD      . bendamustine [facility]) 150 mg in sodium chloride 0.9 % 50 mL (2.6786  mg/mL) chemo infusion  90 mg/m2 Intravenous Once Charlaine Dalton R, MD      . dexamethasone (DECADRON) 10 mg in sodium chloride 0.9 % 50 mL IVPB  10 mg Intravenous Once Charlaine Dalton R, MD      . diphenhydrAMINE (BENADRYL) injection 50 mg  50 mg Intravenous Once Charlaine Dalton R, MD      . heparin lock flush 100 unit/mL  500 Units Intravenous Once Charlaine Dalton R, MD      . obinutuzumab (GAZYVA) 1,[name]  1,000 mg Intravenous Once Charlaine Dalton R, MD      . palonosetron (ALOXI) injection 0.25 mg  0.25 mg Intravenous Once Charlaine Dalton R, MD      . sodium chloride flush (NS) 0.9 % injection 10 mL  10 mL Intravenous PRN [name], MD   10 mL at 12/28/19 0815      .  PHYSICAL EXAMINATION: ECOG PERFORMANCE STATUS: 0 - Asymptomatic  Vitals:   02/09/20 0849  BP: 137/88  Pulse: 78  Resp: 20  Temp: (!) 97.3 F (36.3 C)  SpO2: 100%   Filed Weights   02/09/20 0849  Weight: 145 lb 9.6 oz (66 kg)    Physical Exam HENT:     Head: Normocephalic and atraumatic.     Mouth/Throat:     Pharynx: No oropharyngeal exudate.  Eyes:     Pupils: Pupils are equal, round, and reactive to light.  Cardiovascular:     Rate and Rhythm: Normal rate and regular rhythm.  Pulmonary:     Effort: Pulmonary effort is normal. No respiratory distress.     Breath sounds: Normal breath sounds. No wheezing.  Abdominal:     General: Bowel sounds are normal. There is no distension.     Palpations: Abdomen is soft. There is no mass.     Tenderness: There is no abdominal tenderness. There is no guarding or rebound.  Musculoskeletal:        General: No tenderness. Normal range of motion.     Cervical back: Normal range of motion and neck supple.     Comments: Mild swelling of the left lower extremity; mild tenderness noted.  Skin:    General: Skin is warm.  Neurological:     Mental Status: She is alert and  oriented to person, place, and time.  Psychiatric:        Mood and Affect: Affect normal.      LABORATORY DATA:  I have reviewed the data as listed Lab Results  Component Value Date   WBC 2.4 (L) 02/09/2020   HGB 12.8 02/09/2020   HCT 36.6 02/09/2020   MCV 97.9 02/09/2020   PLT 248 02/09/2020   Recent Labs    10/19/19 0813 10/19/19 0813 11/02/19 0815 11/21/19 0818 11/28/19 2426 11/28/19 8341 12/28/19 0815 12/28/19 0815 01/11/20 1355 02/01/20 0815 02/09/20 0822  NA  --    < >   < > 137 140   < > 137   < > 138 139 138  K  --    < >   < > 3.9 3.6   < > 3.4*   < > 3.8 3.6 3.9  CL  --    < >   < > 105 105   < > 100   < > 102 106 105  CO2  --    < >   < > 24 24   < > 25   < > 29 25 23   GLUCOSE  --    < >   < > 105* 136*   < > 147*   < > 104* 105* 135*  BUN  --    < >   < > 23* 21*   < > 20   < > 18 12 21*  CREATININE  --    < >   < > 0.79 0.86   < > 0.61   < > 0.75 0.59 0.73  CALCIUM  --    < >   < > 8.9 9.0   < > 9.0   < > 8.8* 8.9 9.5  GFRNONAA  --    < >   < > >60 >60   < > >60  --  >60 >60 >60  GFRAA  --   --    < > >60 >60  --  >60  --   --   --   --   PROT 7.4   < >  --   --  7.7   < > 7.6   < > 6.9 6.8 7.7  ALBUMIN 4.1   < >  --   --  4.3   < > 4.4   < > 4.2 3.8 4.5  AST 34   < >  --   --  30   < > 21   < > 21 25 23   ALT 41   < >  --   --  44   < > 25   < > 21 27 27   ALKPHOS 85   < >  --   --  178*   < > 84   < > 128* 74 96  BILITOT 0.6   < >  --   --  0.6   < > 0.5   < > 0.7 0.6 0.9  BILIDIR <0.1  --   --   --   --   --   --   --   --   --   --   IBILI NOT CALCULATED  --   --   --   --   --   --   --   --   --   --    < > = values in this interval not displayed.    RADIOGRAPHIC STUDIES: I have personally reviewed the radiological images as listed and agreed with the findings in the report. No results found.  ASSESSMENT & PLAN:   Grade 3a follicular lymphoma of  lymph nodes of multiple regions [facility]) #Follicular lymphoma grade 3; stage II [versus stage  III-eqivocal neck lymph nodes]. On Obi-benda.  October 04, 2019-PET scan improved response; with significant resolution of the abdominal/retroperitoneal lymph nodes- STABLE.   # proceed with Obi-Benda today# 6 Labs today reviewed; ANC -1.9; Labs today reviewed;  acceptable for treatment today. Will plan PET after 6 cycles; will order at next visit.   # constipation/ abodminal pain/reflux- continue miralax every day. STABLE.   # Joint pains/sec to growth factors-continue meloxicam; add claritin daily OTC; STABLE  # Allergic conjunctivitis-; new; Start zaditor.   # DISPOSITION: # chemo today; this week; udenyca on 11/15.  # Follow up in 4 weeks- ;MD; labs- cbc/cmp;LDH; on chemo;  Dr.B    All questions were answered. The patient knows to call the clinic with any problems, questions or concerns.    [name], MD 02/09/2020 10:20 AM

## 2020-02-10 ENCOUNTER — Inpatient Hospital Stay: Payer: Commercial Managed Care - PPO

## 2020-02-10 ENCOUNTER — Ambulatory Visit: Payer: Commercial Managed Care - PPO

## 2020-02-10 VITALS — BP 132/82 | HR 63 | Temp 99.1°F | Resp 16

## 2020-02-10 DIAGNOSIS — C8238 Follicular lymphoma grade IIIa, lymph nodes of multiple sites: Secondary | ICD-10-CM | POA: Diagnosis not present

## 2020-02-10 DIAGNOSIS — Z7189 Other specified counseling: Secondary | ICD-10-CM

## 2020-02-10 MED ORDER — HEPARIN SOD (PORK) LOCK FLUSH 100 UNIT/ML IV SOLN
INTRAVENOUS | Status: AC
  Filled 2020-02-10: qty 5

## 2020-02-10 MED ORDER — SODIUM CHLORIDE 0.9 % IV SOLN
Freq: Once | INTRAVENOUS | Status: AC
  Filled 2020-02-10: qty 250

## 2020-02-10 MED ORDER — SODIUM CHLORIDE 0.9 % IV SOLN
90.0000 mg/m2 | Freq: Once | INTRAVENOUS | Status: AC
  Administered 2020-02-10: 150 mg via INTRAVENOUS
  Filled 2020-02-10: qty 6

## 2020-02-10 MED ORDER — SODIUM CHLORIDE 0.9% FLUSH
10.0000 mL | INTRAVENOUS | Status: DC | PRN
  Administered 2020-02-10: 10 mL
  Filled 2020-02-10: qty 10

## 2020-02-10 MED ORDER — HEPARIN SOD (PORK) LOCK FLUSH 100 UNIT/ML IV SOLN
500.0000 [IU] | Freq: Once | INTRAVENOUS | Status: AC | PRN
  Administered 2020-02-10: 500 [IU]
  Filled 2020-02-10: qty 5

## 2020-02-10 MED ORDER — SODIUM CHLORIDE 0.9 % IV SOLN
10.0000 mg | Freq: Once | INTRAVENOUS | Status: AC
  Administered 2020-02-10: 10 mg via INTRAVENOUS
  Filled 2020-02-10: qty 10

## 2020-02-10 NOTE — Progress Notes (Signed)
Patient tolerated infusion well. Patient and VSS. Discharged home  

## 2020-02-13 ENCOUNTER — Inpatient Hospital Stay: Payer: Commercial Managed Care - PPO

## 2020-02-13 DIAGNOSIS — Z7189 Other specified counseling: Secondary | ICD-10-CM

## 2020-02-13 DIAGNOSIS — C8238 Follicular lymphoma grade IIIa, lymph nodes of multiple sites: Secondary | ICD-10-CM

## 2020-02-13 MED ORDER — PEGFILGRASTIM-CBQV 6 MG/0.6ML ~~LOC~~ SOSY
6.0000 mg | PREFILLED_SYRINGE | Freq: Once | SUBCUTANEOUS | Status: AC
  Administered 2020-02-13: 6 mg via SUBCUTANEOUS
  Filled 2020-02-13: qty 0.6

## 2020-03-07 ENCOUNTER — Other Ambulatory Visit: Payer: Self-pay | Admitting: Internal Medicine

## 2020-03-08 ENCOUNTER — Inpatient Hospital Stay (HOSPITAL_BASED_OUTPATIENT_CLINIC_OR_DEPARTMENT_OTHER): Payer: Commercial Managed Care - PPO | Admitting: Internal Medicine

## 2020-03-08 ENCOUNTER — Inpatient Hospital Stay: Payer: Commercial Managed Care - PPO | Attending: Internal Medicine

## 2020-03-08 ENCOUNTER — Other Ambulatory Visit: Payer: Self-pay

## 2020-03-08 VITALS — BP 127/86 | HR 64 | Temp 97.1°F | Resp 16 | Wt 148.0 lb

## 2020-03-08 DIAGNOSIS — Z7189 Other specified counseling: Secondary | ICD-10-CM

## 2020-03-08 DIAGNOSIS — C8238 Follicular lymphoma grade IIIa, lymph nodes of multiple sites: Secondary | ICD-10-CM

## 2020-03-08 DIAGNOSIS — I10 Essential (primary) hypertension: Secondary | ICD-10-CM | POA: Insufficient documentation

## 2020-03-08 DIAGNOSIS — Z803 Family history of malignant neoplasm of breast: Secondary | ICD-10-CM | POA: Diagnosis not present

## 2020-03-08 DIAGNOSIS — C8230 Follicular lymphoma grade IIIa, unspecified site: Secondary | ICD-10-CM | POA: Diagnosis not present

## 2020-03-08 DIAGNOSIS — Z79899 Other long term (current) drug therapy: Secondary | ICD-10-CM | POA: Diagnosis not present

## 2020-03-08 LAB — COMPREHENSIVE METABOLIC PANEL
ALT: 36 U/L (ref 0–44)
AST: 27 U/L (ref 15–41)
Albumin: 4.2 g/dL (ref 3.5–5.0)
Alkaline Phosphatase: 86 U/L (ref 38–126)
Anion gap: 11 (ref 5–15)
BUN: 13 mg/dL (ref 6–20)
CO2: 26 mmol/L (ref 22–32)
Calcium: 9.2 mg/dL (ref 8.9–10.3)
Chloride: 103 mmol/L (ref 98–111)
Creatinine, Ser: 0.75 mg/dL (ref 0.44–1.00)
GFR, Estimated: >60 mL/min (ref >60)
Glucose, Bld: 98 mg/dL (ref 70–99)
Potassium: 4.1 mmol/L (ref 3.5–5.1)
Sodium: 140 mmol/L (ref 135–145)
Total Bilirubin: 0.4 mg/dL (ref 0.3–1.2)
Total Protein: 6.8 g/dL (ref 6.5–8.1)

## 2020-03-08 LAB — CBC WITH DIFFERENTIAL/PLATELET
Abs Immature Granulocytes: 0 10*3/uL (ref 0.00–0.07)
Basophils Absolute: 0 10*3/uL (ref 0.0–0.1)
Basophils Relative: 2 %
Eosinophils Absolute: 0.2 10*3/uL (ref 0.0–0.5)
Eosinophils Relative: 10 %
HCT: 35.2 % — ABNORMAL LOW (ref 36.0–46.0)
Hemoglobin: 11.9 g/dL — ABNORMAL LOW (ref 12.0–15.0)
Immature Granulocytes: 0 %
Lymphocytes Relative: 22 %
Lymphs Abs: 0.3 10*3/uL — ABNORMAL LOW (ref 0.7–4.0)
MCH: 34.2 pg — ABNORMAL HIGH (ref 26.0–34.0)
MCHC: 33.8 g/dL (ref 30.0–36.0)
MCV: 101.1 fL — ABNORMAL HIGH (ref 80.0–100.0)
Monocytes Absolute: 0.4 10*3/uL (ref 0.1–1.0)
Monocytes Relative: 25 %
Neutro Abs: 0.6 10*3/uL — ABNORMAL LOW (ref 1.7–7.7)
Neutrophils Relative %: 41 %
Platelets: 277 10*3/uL (ref 150–400)
RBC: 3.48 MIL/uL — ABNORMAL LOW (ref 3.87–5.11)
RDW: 14 % (ref 11.5–15.5)
WBC: 1.5 10*3/uL — ABNORMAL LOW (ref 4.0–10.5)
nRBC: 0 % (ref 0.0–0.2)

## 2020-03-08 LAB — LACTATE DEHYDROGENASE: LDH: 160 U/L (ref 98–192)

## 2020-03-08 NOTE — Assessment & Plan Note (Addendum)
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 On Obi-benda.  October 04, 2019-PET scan improved response; with significant resolution of the abdominal/retroperitoneal lymph nodes- STABLE.    # s/p Obi-Benda today# 6- 19month ago;  Labs today reviewed; ANC -0.6 [s/p udenyca with cycle #6]abs today reviewed;  acceptable for treatment today. Will order PET  Today/plan in 2 months  # Joint pains/sec to growth factors-continue meloxicam; add claritin daily OTC- STABLE.   #IV access: Continue port flush for now; would await PET scan for further discussion.  # DISPOSITION: # Follow up in 2 months-- ;MD; labs- cbc/cmp;LDH; PET prior; port flush Dr.B

## 2020-03-08 NOTE — Progress Notes (Signed)
States she wakes up in the morning with a sour taste in her mouth x1 month.

## 2020-03-08 NOTE — Progress Notes (Signed)
[place] NOTE  Patient Care Team: Center, [facility] as PCP - General ([place]) [name], MD as Consulting Physician (Hematology and Oncology)  CHIEF COMPLAINTS/PURPOSE OF CONSULTATION: Lymphoma   Oncology History Overview Note  # 5th FEB 2021- ABDOMINAL MASS-  9.3 x 3.0 cm central mesenteric soft tissue mass is identified on image 38/series 2. 3.0x 3.0 cm collar of soft tissue surrounds branches of the superior mesenteric artery and vein on image 47/2 with relatively little mass-effect on the vascular anatomy. 2.0 x 1.5 cm nodular component of this abnormal soft tissue is identified in the mesentery of the right pelvis on 55/2. FEB 2021- PET-  PET scan-February 2021-SUV around 5.5 again highly suggestive of malignancy lymphoma.  Small cluster left supra pelvic lymph node/small retroperitoneal lymph nodes-5 mm-Douville score 3.   # MARCH 3500- grade 3A follicular lymphoma [Open biopsy Dr. 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 ([place])   07/11/2019 -  Chemotherapy   The patient had dexamethasone (DECADRON) 4 MG tablet, 8 mg, Oral, Daily, 1 of 1 cycle, Start date: --, [name], 0.25 mg, Intravenous,  Once, 6 of 6 cycles Administration: 0.25 mg (07/11/2019), 0.25 mg (08/22/2019), 0.25 mg (09/22/2019), 0.25 mg (11/28/2019), 0.25 mg (01/03/2020) pegfilgrastim-cbqv (UDENYCA) injection 6 mg, 6 mg, Subcutaneous, Once, 3 of 3 cycles Administration: 6 mg (11/30/2019), 6 mg (01/05/2020), 6 mg (02/13/2020) bendamustine (BENDEKA) 150 mg in sodium chloride 0.9 % 50 mL (2.6786 mg/mL) chemo infusion, 90 mg/m2 = 150 mg, Intravenous,  Once, [name]50 mg (07/11/2019),  150 mg (07/12/2019), 150 mg (08/22/2019), 150 mg (08/24/2019), 150 mg (09/22/2019), 150 mg (09/23/2019), 150 mg (11/28/2019), 150 mg (11/29/2019), 150 mg (01/03/2020), 150 mg (01/04/2020), 150 mg (02/10/2020) obinutuzumab (GAZYVA) 1,[name], 1,000 mg, Intravenous, Once, [name],000 mg (07/11/2019), 1,000 mg (07/18/2019), 1,000 mg (08/22/2019), 1,000 mg (07/27/2019), 1,000 mg (09/22/2019), 1,000 mg (11/28/2019), 1,000 mg (01/03/2020)  for chemotherapy treatment.       HISTORY OF PRESENTING ILLNESS: Patient speaks minimal English/patient daughter [name] in office.  Kim Contreras 59 y.o.  female with follicular lymphoma grade 3 currently s/p cycle # 6 of Obi-Benda 4 weeks ago is here for follow-up.  No fever no chills. No nausea no vomiting. Joint pains chronic not any worse.  No new constipation or   Review of Systems  Constitutional: Positive for malaise/fatigue. Negative for chills, diaphoresis, fever and weight loss.  HENT: Negative for nosebleeds and sore throat.   Eyes: Negative for double vision.  Respiratory: Negative for cough, hemoptysis, sputum production, shortness of breath and wheezing.   Cardiovascular: Negative for chest pain, palpitations, orthopnea and leg swelling.  Gastrointestinal: Negative for blood in stool, constipation, diarrhea, heartburn, melena and vomiting.  Musculoskeletal: Positive for back pain and joint pain.  Skin: Negative.  Negative for itching and rash.  Neurological: Positive for tingling and headaches. Negative for dizziness, focal weakness and weakness.  Endo/Heme/Allergies: Does not bruise/bleed easily.  Psychiatric/Behavioral: Negative for depression. The patient has insomnia. The patient is not nervous/anxious.      MEDICAL HISTORY:  Past Medical History:  Diagnosis Date  .  Anemia 2006  . Anxiety   . Arthritis   . Helicobacter pylori gastritis   . History of hiatal hernia    . Hypertension   . Lymphoma ([place])     SURGICAL HISTORY: Past Surgical History:  Procedure Laterality Date  . APPENDECTOMY    . CHOLECYSTECTOMY N/A 01/19/2015   Procedure: LAPAROSCOPIC CHOLECYSTECTOMY;  [name], MD;  [name];  Service: General;  [name];  . COLONOSCOPY WITH PROPOFOL N/A 10/27/2014   Procedure: COLONOSCOPY WITH PROPOFOL;  [name], MD;  Location: [facility] ENDOSCOPY;  Service: Endoscopy;  [name];  . ESOPHAGOGASTRODUODENOSCOPY    . EXCISION MASS ABDOMINAL N/A 06/03/2019   Procedure: EXCISION MASS ABDOMINAL;  [name], MD;  [name];  Service: General;  [name];  abdominal node biopsy  . NASAL SINUS SURGERY    . PORTACATH PLACEMENT Left 07/08/2019   Procedure: INSERTION PORT-A-CATH;  [name], MD;  [name];  Service: General;  Laterality: Left;  . TUBAL LIGATION      SOCIAL HISTORY: Social History   Socioeconomic History  . Marital status: Married    Spouse name: Not on file  . Number of children: Not on file  . Years of education: Not on file  . Highest education level: Not on file  Occupational History  . Not on file  Tobacco Use  . Smoking status: Never Smoker  . Smokeless tobacco: Never Used  Substance and Sexual Activity  . Alcohol use: No  . Drug use: No  . Sexual activity: Not on file  Other Topics Concern  . Not on file  Social History Narrative  . Not on file   Social Determinants of Health   Financial Resource Strain: Not on file  Food Insecurity: Not on file  Transportation Needs: Not on file  Physical Activity: Not on file  Stress: Not on file  Social Connections: Not on file  Intimate Partner Violence: Not on file    FAMILY HISTORY: Family History  Problem Relation Age of Onset  . Breast cancer Other 43  . Prostate cancer Neg Hx   . Bladder Cancer Neg Hx   . Kidney cancer Neg Hx     ALLERGIES:  has No  Known Allergies.  MEDICATIONS:  Current Outpatient Medications  Medication Sig Dispense Refill  . gabapentin (NEURONTIN) 100 MG capsule Take by mouth.    . hydrochlorothiazide (HYDRODIURIL) 12.5 MG tablet Take 12.5 mg by mouth daily.    Marland Kitchen levocetirizine (XYZAL) 5 MG tablet Take 5 mg by mouth every evening.    Marland Kitchen losartan (COZAAR) 50 MG tablet Take 50 mg by mouth daily.    . meloxicam (MOBIC) 7.5 MG tablet TAKE 1 TABLET(7.5 MG) BY MOUTH DAILY 30 tablet 0  . mirabegron ER (MYRBETRIQ) 25 MG TB24 tablet Take 1 tablet (25 mg total) by mouth daily. 30 tablet 11  . pantoprazole (PROTONIX) 40 MG tablet Take 1 tablet (40 mg total) by mouth daily. 30 tablet 3   No current facility-administered medications for this visit.   Facility-Administered Medications Ordered in Other Visits  Medication Dose Route Frequency Provider Last Rate Last Admin  . sodium chloride flush (NS) 0.9 % injection 10 mL  10 mL Intravenous PRN [name], MD   10 mL at 12/28/19 0815      .  PHYSICAL EXAMINATION: ECOG PERFORMANCE STATUS: 0 - Asymptomatic  Vitals:   03/08/20 1007  BP: 127/86  Pulse:  64  Resp: 16  Temp: (!) 97.1 F (36.2 C)  SpO2: 100%   Filed Weights   03/08/20 1007  Weight: 148 lb (67.1 kg)    Physical Exam HENT:     Head: Normocephalic and atraumatic.     Mouth/Throat:     Pharynx: No oropharyngeal exudate.  Eyes:     Pupils: Pupils are equal, round, and reactive to light.  Cardiovascular:     Rate and Rhythm: Normal rate and regular rhythm.  Pulmonary:     Effort: Pulmonary effort is normal. No respiratory distress.     Breath sounds: Normal breath sounds. No wheezing.  Abdominal:     General: Bowel sounds are normal. There is no distension.     Palpations: Abdomen is soft. There is no mass.     Tenderness: There is no abdominal tenderness. There is no guarding or rebound.  Musculoskeletal:        General: No tenderness. Normal range of motion.     Cervical back: Normal  range of motion and neck supple.     Comments: Mild swelling of the left lower extremity; mild tenderness noted.  Skin:    General: Skin is warm.  Neurological:     Mental Status: She is alert and oriented to person, place, and time.  Psychiatric:        Mood and Affect: Affect normal.      LABORATORY DATA:  I have reviewed the data as listed Lab Results  Component Value Date   WBC 1.5 (L) 03/08/2020   HGB 11.9 (L) 03/08/2020   HCT 35.2 (L) 03/08/2020   MCV 101.1 (H) 03/08/2020   PLT 277 03/08/2020   Recent Labs    10/19/19 0813 11/02/19 0815 11/21/19 0818 11/28/19 8466 12/28/19 0815 01/11/20 1355 02/01/20 0815 02/09/20 0822 03/08/20 0927  NA  --    < > 137 140 137   < > 139 138 140  K  --    < > 3.9 3.6 3.4*   < > 3.6 3.9 4.1  CL  --    < > 105 105 100   < > 106 105 103  CO2  --    < > 24 24 25    < > 25 23 26   GLUCOSE  --    < > 105* 136* 147*   < > 105* 135* 98  BUN  --    < > 23* 21* 20   < > 12 21* 13  CREATININE  --    < > 0.79 0.86 0.61   < > 0.59 0.73 0.75  CALCIUM  --    < > 8.9 9.0 9.0   < > 8.9 9.5 9.2  GFRNONAA  --    < > >60 >60 >60   < > >60 >60 >60  GFRAA  --    < > >60 >60 >60  --   --   --   --   PROT 7.4  --   --  7.7 7.6   < > 6.8 7.7 6.8  ALBUMIN 4.1  --   --  4.3 4.4   < > 3.8 4.5 4.2  AST 34  --   --  30 21   < > 25 23 27   ALT 41  --   --  44 25   < > 27 27 36  ALKPHOS 85  --   --  178* 84   < > 74 96 86  BILITOT  0.6  --   --  0.6 0.5   < > 0.6 0.9 0.4  BILIDIR <0.1  --   --   --   --   --   --   --   --   IBILI NOT CALCULATED  --   --   --   --   --   --   --   --    < > = values in this interval not displayed.    RADIOGRAPHIC STUDIES: I have personally reviewed the radiological images as listed and agreed with the findings in the report. No results found.  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 On  Obi-benda.  October 04, 2019-PET scan improved response; with significant resolution of the abdominal/retroperitoneal lymph nodes- STABLE.    # s/p Obi-Benda today# 6- 53month ago;  Labs today reviewed; ANC -0.6 [s/p udenyca with cycle #6]abs today reviewed;  acceptable for treatment today. Will order PET  Today/plan in 2 months  # Joint pains/sec to growth factors-continue meloxicam; add claritin daily OTC- STABLE.   #IV access: Continue port flush for now; would await PET scan for further discussion.  # DISPOSITION: # Follow up in 2 months-- ;MD; labs- cbc/cmp;LDH; PET prior; port flush Dr.B    All questions were answered. The patient knows to call the clinic with any problems, questions or concerns.    [name], MD 03/10/2020 5:44 AM

## 2020-03-26 ENCOUNTER — Telehealth: Payer: Self-pay | Admitting: *Deleted

## 2020-03-26 NOTE — Telephone Encounter (Signed)
Call returned to daughter and advised of physician response and she states that she will get her tested and contact PCP as well as to have her drink more fluids. She states that she will let us know the test results

## 2020-03-26 NOTE — Telephone Encounter (Signed)
Encourage oral hydration, COVID testing. Appreciate if we get updated of her covid result. But she needs to communicate her results and symptoms with her PCP too.

## 2020-03-26 NOTE — Telephone Encounter (Signed)
Daughter called reporting that patient has a sore throat and wants to know what she can take for it. She is not running fever, just burning in her throat which started yesterday

## 2020-03-28 ENCOUNTER — Other Ambulatory Visit: Payer: Self-pay

## 2020-03-28 ENCOUNTER — Emergency Department
Admission: EM | Admit: 2020-03-28 | Discharge: 2020-03-28 | Disposition: A | Discharge status: Home / Self Care | Payer: Commercial Managed Care - PPO | Attending: Emergency Medicine | Admitting: Emergency Medicine

## 2020-03-28 ENCOUNTER — Encounter: Payer: Self-pay | Admitting: *Deleted

## 2020-03-28 ENCOUNTER — Emergency Department: Payer: Commercial Managed Care - PPO

## 2020-03-28 DIAGNOSIS — I10 Essential (primary) hypertension: Secondary | ICD-10-CM | POA: Insufficient documentation

## 2020-03-28 DIAGNOSIS — R059 Cough, unspecified: Secondary | ICD-10-CM

## 2020-03-28 DIAGNOSIS — U071 COVID-19: Secondary | ICD-10-CM | POA: Diagnosis not present

## 2020-03-28 DIAGNOSIS — J029 Acute pharyngitis, unspecified: Secondary | ICD-10-CM | POA: Diagnosis present

## 2020-03-28 LAB — POC SARS CORONAVIRUS 2 AG -  ED: SARS Coronavirus 2 Ag: POSITIVE — AB

## 2020-03-28 MED ORDER — PREDNISONE 50 MG PO TABS: 50.0000 mg | ORAL_TABLET | Freq: Every day | ORAL | 0 refills | Status: DC

## 2020-03-28 MED ORDER — ALBUTEROL SULFATE HFA 108 (90 BASE) MCG/ACT IN AERS: 2.0000 | INHALATION_SPRAY | RESPIRATORY_TRACT | 0 refills | Status: DC | PRN

## 2020-03-28 MED ORDER — PSEUDOEPH-BROMPHEN-DM 30-2-10 MG/5ML PO SYRP: 10.0000 mL | ORAL_SOLUTION | Freq: Four times a day (QID) | ORAL | 0 refills | Status: DC | PRN

## 2020-03-28 NOTE — ED Provider Notes (Signed)
[facility] Emergency Department Provider Note  ____________________________________________  Time seen: Approximately 9:23 PM  I have reviewed the triage vital signs and the nursing notes.   HISTORY  Chief Complaint Sore Throat    HPI Kim Contreras is a 59 y.o. female who presents the emergency department concern for possible Covid infection. Patient states that  she has a history of lymphoma, is currently being followed in the cancer center for same. She developed symptoms consistent with Covid and is concerned as she had a positive home test. Patient states that this is her second Covid infection this year, her first infection was in March of this year. Patient is having body aches, fevers and chills, nasal congestion, cough. No frank shortness of breath. She is able to keep down foods and medicines without difficulty currently. Patient states that she feels like she has a cold but with a positive Covid test she wanted to confirm with testing here in the emergency department. No chest pain, abdominal pain, difficulty breathing.        Past Medical History:  Diagnosis Date  . Anemia 2006  . Anxiety   . Arthritis   . Helicobacter pylori gastritis   . History of hiatal hernia   . Hypertension   . Lymphoma [facility])     Patient Active Problem List   Diagnosis Date Noted  . Drug-induced neutropenia (HCC) 11/21/2019  . Swelling of left lower extremity 11/02/2019  . Goals of care, counseling/discussion 06/13/2019  . Lymphoma of lymph nodes in abdomen (HCC) 06/10/2019  . 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 Lymphoma (HCC) 06/03/2019  . Irregularly shaped mass of abdomen 05/09/2019    Past Surgical History:  Procedure Laterality Date  . APPENDECTOMY    . CHOLECYSTECTOMY N/A 01/19/2015   Procedure: LAPAROSCOPIC CHOLECYSTECTOMY;  Surgeon: Nadeen Landau, MD;  [name];  Service: General;   [name];  . COLONOSCOPY WITH PROPOFOL N/A 10/27/2014   Procedure: COLONOSCOPY WITH PROPOFOL;  Surgeon: Christena Deem, MD;  Location: [facility] ENDOSCOPY;  Service: Endoscopy;  [name];  . ESOPHAGOGASTRODUODENOSCOPY    . EXCISION MASS ABDOMINAL N/A 06/03/2019   Procedure: EXCISION MASS ABDOMINAL;  Surgeon: Earline Mayotte, MD;  [name];  Service: General;  [name];  abdominal node biopsy  . NASAL SINUS SURGERY    . PORTACATH PLACEMENT Left 07/08/2019   Procedure: INSERTION PORT-A-CATH;  Surgeon: Earline Mayotte, MD;  [name];  Service: General;  Laterality: Left;  . TUBAL LIGATION      Prior to Admission medications   Medication Sig Start Date End Date Taking? Authorizing Provider  albuterol (VENTOLIN HFA) 108 (90 Base) MCG/ACT inhaler Inhale 2 puffs into the lungs every 4 (four) hours as needed for wheezing or shortness of breath. 03/28/20  Yes Diany Formosa, Delorise Royals, PA-C  brompheniramine-pseudoephedrine-DM 30-2-10 MG/5ML syrup Take 10 mLs by mouth 4 (four) times daily as needed. 03/28/20  Yes Kairee Kozma, Delorise Royals, PA-C  predniSONE (DELTASONE) 50 MG tablet Take 1 tablet (50 mg total) by mouth daily with breakfast. 03/28/20  Yes Chou Busler, Delorise Royals, PA-C  gabapentin (NEURONTIN) 100 MG capsule Take by mouth. 01/13/20   [provider]  hydrochlorothiazide (HYDRODIURIL) 12.5 MG tablet Take 12.5 mg by mouth daily. 12/15/19   [provider]  levocetirizine (XYZAL) 5 MG tablet Take 5 mg by mouth every evening.    [provider]  losartan (COZAAR) 50 MG tablet Take  50 mg by mouth daily.    [provider]  meloxicam (MOBIC) 7.5 MG tablet TAKE 1 TABLET(7.5 MG) BY MOUTH DAILY 03/07/20   [name], MD  mirabegron ER (MYRBETRIQ) 25 MG TB24 tablet Take 1 tablet (25 mg total) by mouth daily. 12/30/19   Hollice Espy, MD  pantoprazole (PROTONIX) 40 MG tablet Take 1 tablet (40 mg total) by mouth daily. 01/11/20    Borders, Kirt Boys, NP    Allergies Patient has no known allergies.  Family History  Problem Relation Age of Onset  . Breast cancer Other 43  . Prostate cancer Neg Hx   . Bladder Cancer Neg Hx   . Kidney cancer Neg Hx     Social History Social History   Tobacco Use  . Smoking status: Never Smoker  . Smokeless tobacco: Never Used  Substance Use Topics  . Alcohol use: No  . Drug use: No     Review of Systems  Constitutional: Positive fever/chills. Positive for body aches Eyes: No visual changes. No discharge ENT: Nasal congestion and sore throat Cardiovascular: no chest pain. Respiratory: Positive cough. No SOB. Gastrointestinal: No abdominal pain.  No nausea, no vomiting.  No diarrhea.  No constipation. Musculoskeletal: Negative for musculoskeletal pain. Skin: Negative for rash, abrasions, lacerations, ecchymosis. Neurological: Negative for headaches, focal weakness or numbness.  10 System ROS otherwise negative.  ____________________________________________   PHYSICAL EXAM:  VITAL SIGNS: ED Triage Vitals  Enc Vitals Group     BP 03/28/20 1924 (!) 153/103     Pulse Rate 03/28/20 1922 (!) 110     Resp 03/28/20 1922 16     Temp 03/28/20 1922 99.6 F (37.6 C)     Temp src --      SpO2 03/28/20 1922 97 %     Weight --      Height --      Head Circumference --      Peak Flow --      Pain Score 03/28/20 1922 8     Pain Loc --      Pain Edu? --      Excl. in [place]? --      Constitutional: Alert and oriented. Well appearing and in no acute distress. Eyes: Conjunctivae are normal. PERRL. EOMI. Head: Atraumatic. ENT:      Ears:       Nose: No congestion/rhinnorhea.      Mouth/Throat: Mucous membranes are moist. Oropharynx is nonerythematous and nonedematous. Use midline Neck: No stridor. Neck is supple full range of motion Hematological/Lymphatic/Immunilogical: No cervical lymphadenopathy. Cardiovascular: Normal rate, regular rhythm. Normal S1 and S2.  Good  peripheral circulation. Respiratory: Normal respiratory effort without tachypnea or retractions. Lungs CTAB. Good air entry to the bases with no decreased or absent breath sounds. Gastrointestinal: Bowel sounds 4 quadrants. Soft and nontender to palpation. No guarding or rigidity. No palpable masses. No distention. No CVA tenderness. Musculoskeletal: Full range of motion to all extremities. No gross deformities appreciated. Neurologic:  Normal speech and language. No gross focal neurologic deficits are appreciated.  Skin:  Skin is warm, dry and intact. No rash noted. Psychiatric: Mood and affect are normal. Speech and behavior are normal. Patient exhibits appropriate insight and judgement.   ____________________________________________   LABS (all labs ordered are listed, but only abnormal results are displayed)  Labs Reviewed  POC SARS CORONAVIRUS 2 AG -  ED - Abnormal; Notable for the following components:      Result Value   SARS  Coronavirus 2 Ag POSITIVE (*)    All other components within normal limits   ____________________________________________  EKG   ____________________________________________  RADIOLOGY I personally viewed and evaluated these images as part of my medical decision making, as well as reviewing the written report by the radiologist.  ED Provider Interpretation: No consolidations concerning for pneumonia. Reassuring chest x-ray at this time.  DG Chest 2 View  Result Date: 03/28/2020 CLINICAL DATA:  Cough, fever. EXAM: CHEST - 2 VIEW COMPARISON:  July 08, 2019. FINDINGS: The heart size and mediastinal contours are within normal limits. Both lungs are clear. Left subclavian Port-A-Cath is noted with tip in expected position of the SVC. The visualized skeletal structures are unremarkable. IMPRESSION: No active cardiopulmonary disease. Electronically Signed   By: Marijo Conception M.D.   On: 03/28/2020 20:13     ____________________________________________    PROCEDURES  Procedure(s) performed:    Procedures    Medications - No data to display   ____________________________________________   INITIAL IMPRESSION / ASSESSMENT AND PLAN / ED COURSE  Pertinent labs & imaging results that were available during my care of the patient were reviewed by me and considered in my medical decision making (see chart for details).  Review of the [place] CSRS was performed in accordance of the [place] prior to dispensing any controlled drugs.           Patient's diagnosis is consistent with COVID-19. Patient presented to the emergency department with symptoms consistent with COVID-19. Patient is a cancer patient with a diagnosis of lymphoma. Patient is currently being followed by cancer center for same. Patient developed symptoms consistent with Covid and had a positive test at home. This is confirmed in the emergency department. Patient's vital signs are stable. Exam is reassuring. Patient is in no distress and has no chest pain, shortness of breath, is maintaining good oral hydration. At this time there is no indication for further work-up or admission. Given the patient's medical history I have recommended advising her oncologist of her diagnosis so that they may follow her through this course. Patient is agreeable with this plan. Patient will have a short course of prednisone, albuterol, cough medication for her symptoms. She can continue Tylenol and Motrin at home. Drink plenty of fluids. Strict return precautions for any worsening of symptoms are discussed with the patient. Follow-up primary care and oncology as directed..  Patient is given ED precautions to return to the ED for any worsening or new symptoms.     ____________________________________________  FINAL CLINICAL IMPRESSION(S) / ED DIAGNOSES  Final diagnoses:  COVID-19      NEW MEDICATIONS STARTED DURING THIS VISIT:  ED Discharge  Orders         Ordered    predniSONE (DELTASONE) 50 MG tablet  Daily with breakfast        03/28/20 2123    albuterol (VENTOLIN HFA) 108 (90 Base) MCG/ACT inhaler  Every 4 hours PRN        03/28/20 2123    brompheniramine-pseudoephedrine-DM 30-2-10 MG/5ML syrup  4 times daily PRN        03/28/20 2123              This chart was dictated using voice recognition software/Dragon. Despite best efforts to proofread, errors can occur which can change the meaning. Any change was purely unintentional.    Darletta Moll, PA-C 03/28/20 2127    Delman Kitten, MD 03/28/20 2244

## 2020-03-28 NOTE — ED Triage Notes (Signed)
Headache, sore throat, cough, fever since Monday. Positive home Covid test today, wants to make sure the test was correct.

## 2020-04-05 ENCOUNTER — Other Ambulatory Visit: Payer: Self-pay | Admitting: Internal Medicine

## 2020-04-05 NOTE — Telephone Encounter (Signed)
Kim Contreras - Please consider RF 

## 2020-04-13 ENCOUNTER — Ambulatory Visit
Admission: RE | Admit: 2020-04-13 | Discharge: 2020-04-13 | Disposition: A | Discharge status: Home / Self Care | Payer: Commercial Managed Care - PPO | Source: Ambulatory Visit | Attending: Primary Care | Admitting: Primary Care

## 2020-04-13 ENCOUNTER — Ambulatory Visit
Admission: RE | Admit: 2020-04-13 | Discharge: 2020-04-13 | Disposition: A | Discharge status: Home / Self Care | Payer: Commercial Managed Care - PPO | Attending: Primary Care | Admitting: Primary Care

## 2020-04-13 ENCOUNTER — Other Ambulatory Visit: Payer: Self-pay | Admitting: Primary Care

## 2020-04-13 DIAGNOSIS — R051 Acute cough: Secondary | ICD-10-CM | POA: Diagnosis not present

## 2020-04-17 ENCOUNTER — Emergency Department: Payer: Commercial Managed Care - PPO

## 2020-04-17 ENCOUNTER — Inpatient Hospital Stay
Admission: EM | Admit: 2020-04-17 | Discharge: 2020-04-20 | DRG: 177 | Disposition: A | Discharge status: Home / Self Care | Payer: Commercial Managed Care - PPO | Attending: Internal Medicine | Admitting: Internal Medicine

## 2020-04-17 ENCOUNTER — Other Ambulatory Visit: Payer: Self-pay

## 2020-04-17 DIAGNOSIS — C8230 Follicular lymphoma grade IIIa, unspecified site: Secondary | ICD-10-CM | POA: Diagnosis present

## 2020-04-17 DIAGNOSIS — Z7952 Long term (current) use of systemic steroids: Secondary | ICD-10-CM | POA: Diagnosis not present

## 2020-04-17 DIAGNOSIS — J159 Unspecified bacterial pneumonia: Secondary | ICD-10-CM | POA: Diagnosis not present

## 2020-04-17 DIAGNOSIS — J1282 Pneumonia due to coronavirus disease 2019: Secondary | ICD-10-CM | POA: Diagnosis not present

## 2020-04-17 DIAGNOSIS — U071 COVID-19: Principal | ICD-10-CM | POA: Diagnosis present

## 2020-04-17 DIAGNOSIS — Z9049 Acquired absence of other specified parts of digestive tract: Secondary | ICD-10-CM | POA: Diagnosis not present

## 2020-04-17 DIAGNOSIS — D701 Agranulocytosis secondary to cancer chemotherapy: Secondary | ICD-10-CM | POA: Diagnosis not present

## 2020-04-17 DIAGNOSIS — E876 Hypokalemia: Secondary | ICD-10-CM | POA: Diagnosis not present

## 2020-04-17 DIAGNOSIS — C8238 Follicular lymphoma grade IIIa, lymph nodes of multiple sites: Secondary | ICD-10-CM | POA: Diagnosis not present

## 2020-04-17 DIAGNOSIS — Z791 Long term (current) use of non-steroidal anti-inflammatories (NSAID): Secondary | ICD-10-CM | POA: Diagnosis not present

## 2020-04-17 DIAGNOSIS — Z79899 Other long term (current) drug therapy: Secondary | ICD-10-CM | POA: Diagnosis not present

## 2020-04-17 DIAGNOSIS — Z803 Family history of malignant neoplasm of breast: Secondary | ICD-10-CM | POA: Diagnosis not present

## 2020-04-17 DIAGNOSIS — T451X5A Adverse effect of antineoplastic and immunosuppressive drugs, initial encounter: Secondary | ICD-10-CM | POA: Diagnosis not present

## 2020-04-17 DIAGNOSIS — I10 Essential (primary) hypertension: Secondary | ICD-10-CM | POA: Diagnosis not present

## 2020-04-17 LAB — BASIC METABOLIC PANEL
Anion gap: 10 (ref 5–15)
BUN: 9 mg/dL (ref 6–20)
CO2: 26 mmol/L (ref 22–32)
Calcium: 8.6 mg/dL — ABNORMAL LOW (ref 8.9–10.3)
Chloride: 103 mmol/L (ref 98–111)
Creatinine, Ser: 0.67 mg/dL (ref 0.44–1.00)
GFR, Estimated: >60 mL/min (ref >60)
Glucose, Bld: 109 mg/dL — ABNORMAL HIGH (ref 70–99)
Potassium: 3.7 mmol/L (ref 3.5–5.1)
Sodium: 139 mmol/L (ref 135–145)

## 2020-04-17 LAB — CBC WITH DIFFERENTIAL/PLATELET
Abs Immature Granulocytes: 0 10*3/uL (ref 0.00–0.07)
Basophils Absolute: 0 10*3/uL (ref 0.0–0.1)
Basophils Relative: 0 %
Eosinophils Absolute: 0 10*3/uL (ref 0.0–0.5)
Eosinophils Relative: 0 %
HCT: 38.7 % (ref 36.0–46.0)
Hemoglobin: 13.1 g/dL (ref 12.0–15.0)
Lymphocytes Relative: 38 %
Lymphs Abs: 0.5 10*3/uL — ABNORMAL LOW (ref 0.7–4.0)
MCH: 32.9 pg (ref 26.0–34.0)
MCHC: 33.9 g/dL (ref 30.0–36.0)
MCV: 97.2 fL (ref 80.0–100.0)
Monocytes Absolute: 0.2 10*3/uL (ref 0.1–1.0)
Monocytes Relative: 14 %
Neutro Abs: 0.6 10*3/uL — ABNORMAL LOW (ref 1.7–7.7)
Neutrophils Relative %: 48 %
Platelets: 168 10*3/uL (ref 150–400)
RBC: 3.98 MIL/uL (ref 3.87–5.11)
RDW: 13 % (ref 11.5–15.5)
WBC: 1.3 10*3/uL — CL (ref 4.0–10.5)
nRBC: 0 % (ref 0.0–0.2)

## 2020-04-17 LAB — CBC
HCT: 38.3 % (ref 36.0–46.0)
Hemoglobin: 13 g/dL (ref 12.0–15.0)
MCH: 33.2 pg (ref 26.0–34.0)
MCHC: 33.9 g/dL (ref 30.0–36.0)
MCV: 97.7 fL (ref 80.0–100.0)
Platelets: 171 10*3/uL (ref 150–400)
RBC: 3.92 MIL/uL (ref 3.87–5.11)
RDW: 13 % (ref 11.5–15.5)
WBC: 1.2 10*3/uL — CL (ref 4.0–10.5)
nRBC: 0 % (ref 0.0–0.2)

## 2020-04-17 LAB — TROPONIN I (HIGH SENSITIVITY): Troponin I (High Sensitivity): 7 ng/L (ref <18)

## 2020-04-17 MED ORDER — HYDROCOD POLST-CPM POLST ER 10-8 MG/5ML PO SUER
5.0000 mL | Freq: Two times a day (BID) | ORAL | Status: DC | PRN
  Administered 2020-04-18 – 2020-04-19 (×3): 5 mL via ORAL
  Filled 2020-04-17 (×3): qty 5

## 2020-04-17 MED ORDER — ONDANSETRON HCL 4 MG PO TABS
4.0000 mg | ORAL_TABLET | Freq: Four times a day (QID) | ORAL | Status: DC | PRN
  Administered 2020-04-18: 4 mg via ORAL
  Filled 2020-04-17: qty 1

## 2020-04-17 MED ORDER — SODIUM CHLORIDE 0.9 % IV SOLN
200.0000 mg | Freq: Once | INTRAVENOUS | Status: AC
  Administered 2020-04-18: 200 mg via INTRAVENOUS
  Filled 2020-04-17: qty 200

## 2020-04-17 MED ORDER — ZINC SULFATE 220 (50 ZN) MG PO CAPS
220.0000 mg | ORAL_CAPSULE | Freq: Every day | ORAL | Status: DC
  Administered 2020-04-18 – 2020-04-20 (×3): 220 mg via ORAL
  Filled 2020-04-17 (×3): qty 1

## 2020-04-17 MED ORDER — ENOXAPARIN SODIUM 40 MG/0.4ML ~~LOC~~ SOLN
40.0000 mg | SUBCUTANEOUS | Status: DC
  Administered 2020-04-18 – 2020-04-19 (×2): 40 mg via SUBCUTANEOUS
  Filled 2020-04-17 (×3): qty 0.4

## 2020-04-17 MED ORDER — GUAIFENESIN-DM 100-10 MG/5ML PO SYRP
10.0000 mL | ORAL_SOLUTION | ORAL | Status: DC | PRN
  Administered 2020-04-18 – 2020-04-19 (×2): 10 mL via ORAL
  Filled 2020-04-17 (×2): qty 10

## 2020-04-17 MED ORDER — SODIUM CHLORIDE 0.9 % IV SOLN
2.0000 g | Freq: Once | INTRAVENOUS | Status: AC
  Administered 2020-04-17: 2 g via INTRAVENOUS
  Filled 2020-04-17: qty 2

## 2020-04-17 MED ORDER — ACETAMINOPHEN 325 MG PO TABS
650.0000 mg | ORAL_TABLET | Freq: Four times a day (QID) | ORAL | Status: DC | PRN
  Administered 2020-04-18 (×2): 650 mg via ORAL
  Filled 2020-04-17 (×2): qty 2

## 2020-04-17 MED ORDER — VANCOMYCIN HCL IN DEXTROSE 1-5 GM/200ML-% IV SOLN
1000.0000 mg | Freq: Once | INTRAVENOUS | Status: AC
  Administered 2020-04-18: 1000 mg via INTRAVENOUS
  Filled 2020-04-17: qty 200

## 2020-04-17 MED ORDER — SODIUM CHLORIDE 0.9 % IV BOLUS
1000.0000 mL | Freq: Once | INTRAVENOUS | Status: AC
  Administered 2020-04-17: 1000 mL via INTRAVENOUS

## 2020-04-17 MED ORDER — SODIUM CHLORIDE 0.9 % IV SOLN
500.0000 mg | Freq: Every day | INTRAVENOUS | Status: DC
  Administered 2020-04-18: 500 mg via INTRAVENOUS
  Filled 2020-04-17 (×2): qty 500

## 2020-04-17 MED ORDER — SODIUM CHLORIDE 0.9 % IV SOLN: 200.0000 mg | Freq: Once | INTRAVENOUS | Status: DC

## 2020-04-17 MED ORDER — ONDANSETRON HCL 4 MG/2ML IJ SOLN: 4.0000 mg | Freq: Four times a day (QID) | INTRAMUSCULAR | Status: DC | PRN

## 2020-04-17 MED ORDER — SODIUM CHLORIDE 0.9 % IV SOLN: 100.0000 mg | Freq: Every day | INTRAVENOUS | Status: DC

## 2020-04-17 MED ORDER — CEFTRIAXONE SODIUM 2 G IJ SOLR
2.0000 g | INTRAMUSCULAR | Status: DC
  Filled 2020-04-17: qty 20

## 2020-04-17 MED ORDER — ASCORBIC ACID 500 MG PO TABS
500.0000 mg | ORAL_TABLET | Freq: Every day | ORAL | Status: DC
  Administered 2020-04-18 – 2020-04-20 (×3): 500 mg via ORAL
  Filled 2020-04-17 (×3): qty 1

## 2020-04-17 MED ORDER — ALBUTEROL SULFATE HFA 108 (90 BASE) MCG/ACT IN AERS
2.0000 | INHALATION_SPRAY | Freq: Four times a day (QID) | RESPIRATORY_TRACT | Status: DC
  Administered 2020-04-18 – 2020-04-20 (×8): 2 via RESPIRATORY_TRACT
  Filled 2020-04-17 (×2): qty 6.7

## 2020-04-17 NOTE — H&P (Addendum)
History and Physical    Kim Contreras WTU:[id number] DOB: September 21, 1960 DOA: 04/17/2020  [facility]   Patient coming from: Home  I have personally briefly reviewed patient's old medical records in [place]  Chief Complaint: Shortness of breath, cough  HPI: Kim Contreras is a 60 y.o. female with medical history significant for follicular lymphoma, and hypertension, diagnosed with COVID on 12/29 and treated with Levaquin for possible superimposed pneumonia from 1/14-1/18 who was sent in by her PCP due to worsening cough and shortness of breath.  Has an associated headache, sore throat, impaired taste and decreased appetite and states she had a fever up until a few days ago.  Has been having intermittent chest pain, of moderate intensity, nonradiating, worse on coughing and generalized malaise. ED Course: On arrival she had a low-grade temperature of 99.2 with otherwise normal vitals.  On her blood work she was leukopenic with white cell count of 1300, down from 1500 a month prior  CBC otherwise unremarkable.  BMP normal.  Troponin 7. EKG as reviewed by me : NSR at 85 with nonspecific ST-T wave changes Imaging: Chest x-ray showed increasing bibasilar airspace opacities when compared chest x-ray on 1/14, likely represents sequelae from prior COVID-19 infection. CTA chest pending  Patient started on remdesivir.  Hospitalist consulted for admission.  Review of Systems: As per HPI otherwise all other systems on review of systems negative.    Past Medical History:  Diagnosis Date  . Anemia 2006  . Anxiety   . Arthritis   . Helicobacter pylori gastritis   . History of hiatal hernia   . Hypertension   . Lymphoma [facility])     Past Surgical History:  Procedure Laterality Date  . APPENDECTOMY    . CHOLECYSTECTOMY N/A 01/19/2015   Procedure: LAPAROSCOPIC CHOLECYSTECTOMY;  [name], MD;  [name];  Service: General;  Laterality:  N/A;  . COLONOSCOPY WITH PROPOFOL N/A 10/27/2014   Procedure: COLONOSCOPY WITH PROPOFOL;  [name], MD;  Location: [facility] ENDOSCOPY;  Service: Endoscopy;  [name];  . ESOPHAGOGASTRODUODENOSCOPY    . EXCISION MASS ABDOMINAL N/A 06/03/2019   Procedure: EXCISION MASS ABDOMINAL;  [name], MD;  [name];  Service: General;  [name];  abdominal node biopsy  . NASAL SINUS SURGERY    . PORTACATH PLACEMENT Left 07/08/2019   Procedure: INSERTION PORT-A-CATH;  [name], MD;  [name];  Service: General;  Laterality: Left;  . TUBAL LIGATION       reports that she has never smoked. She has never used smokeless tobacco. She reports that she does not drink alcohol and does not use drugs.  No Known Allergies  Family History  Problem Relation Age of Onset  . Breast cancer Other 43  . Prostate cancer Neg Hx   . Bladder Cancer Neg Hx   . Kidney cancer Neg Hx       Prior to Admission medications   Medication Sig Start Date End Date Taking? Authorizing Provider  albuterol (VENTOLIN HFA) 108 (90 Base) MCG/ACT inhaler Inhale 2 puffs into the lungs every 4 (four) hours as needed for wheezing or shortness of breath. 03/28/20   Cuthriell, Charline Bills, PA-C  brompheniramine-pseudoephedrine-DM 30-2-10 MG/5ML syrup Take 10 mLs by mouth 4 (four) times daily as needed. 03/28/20   Cuthriell, Charline Bills, PA-C  gabapentin (NEURONTIN) 100 MG capsule Take by mouth. 01/13/20   [provider]  hydrochlorothiazide (HYDRODIURIL) 12.5 MG tablet Take 12.5 mg by mouth daily. 12/15/19   [provider]  levocetirizine (XYZAL) 5 MG tablet Take 5 mg by mouth every evening.    [provider]  losartan (COZAAR) 50 MG tablet Take 50 mg by mouth daily.    [provider]  meloxicam (MOBIC) 7.5 MG tablet TAKE 1 TABLET(7.5 MG) BY MOUTH DAILY 04/06/20   Verlon Au, NP  mirabegron ER (MYRBETRIQ) 25 MG TB24 tablet  Take 1 tablet (25 mg total) by mouth daily. 12/30/19   Hollice Espy, MD  pantoprazole (PROTONIX) 40 MG tablet Take 1 tablet (40 mg total) by mouth daily. 01/11/20   Borders, Kirt Boys, NP  predniSONE (DELTASONE) 50 MG tablet Take 1 tablet (50 mg total) by mouth daily with breakfast. 03/28/20   Darletta Moll, PA-C    Physical Exam: Vitals:   04/17/20 1802 04/17/20 2207  BP: 125/80 133/86  Pulse: 93 96  Resp: 18 18  Temp: 99.2 F (37.3 C)   TempSrc: Oral   SpO2: 100% 95%  Weight: 67.6 kg   Height: 5' (1.524 m)      Vitals:   04/17/20 1802 04/17/20 2207  BP: 125/80 133/86  Pulse: 93 96  Resp: 18 18  Temp: 99.2 F (37.3 C)   TempSrc: Oral   SpO2: 100% 95%  Weight: 67.6 kg   Height: 5' (1.524 m)       Constitutional:  Ill-appearing but alert and oriented x 3 .  Conversational dyspnea HEENT:      Head: Normocephalic and atraumatic.         Eyes: PERLA, EOMI, Conjunctivae are normal. Sclera is non-icteric.       Mouth/Throat: Mucous membranes are moist.       Neck: Supple with no signs of meningismus. Cardiovascular: Regular rate and rhythm. No murmurs, gallops, or rubs. 2+ symmetrical distal pulses are present . No JVD. No LE edema Respiratory: Respiratory effort increased.Lungs sounds diminished with coarse breath sounds bilaterally gastrointestinal: Soft, non tender, and non distended with positive bowel sounds.  Genitourinary: No CVA tenderness. Musculoskeletal: Nontender with normal range of motion in all extremities. No cyanosis, or erythema of extremities. Neurologic:  Face is symmetric. Moving all extremities. No gross focal neurologic deficits . Skin: Skin is warm, dry.  No rash or ulcers Psychiatric: Mood and affect are normal    Labs on Admission: I have personally reviewed following labs and imaging studies  CBC: Recent Labs  Lab 04/17/20 1803  WBC 1.3*  1.2*  NEUTROABS 0.6*  HGB 13.1  13.0  HCT 38.7  38.3  MCV 97.2  97.7  PLT 168  [id number]    Basic Metabolic Panel: Recent Labs  Lab 04/17/20 1803  NA 139  K 3.7  CL 103  CO2 26  GLUCOSE 109*  BUN 9  CREATININE 0.67  CALCIUM 8.6*   GFR: Estimated Creatinine Clearance: 65.7 mL/min (by C-G formula based on SCr of 0.67 mg/dL). Liver Function Tests: No results for input(s): AST, ALT, ALKPHOS, BILITOT, PROT, ALBUMIN in the last 168 hours. No results for input(s): LIPASE, AMYLASE in the last 168 hours. No results for input(s): AMMONIA in the last 168 hours. Coagulation Profile: No results for input(s): INR, PROTIME in the last 168 hours. Cardiac Enzymes: No results for input(s): CKTOTAL, CKMB, CKMBINDEX, TROPONINI in the last 168 hours. BNP (last 3 results) No results for input(s): PROBNP in the last 8760 hours. HbA1C: No results for input(s): HGBA1C  in the last 72 hours. CBG: No results for input(s): GLUCAP in the last 168 hours. Lipid Profile: No results for input(s): CHOL, HDL, LDLCALC, TRIG, CHOLHDL, LDLDIRECT in the last 72 hours. Thyroid Function Tests: No results for input(s): TSH, T4TOTAL, FREET4, T3FREE, THYROIDAB in the last 72 hours. Anemia Panel: No results for input(s): VITAMINB12, FOLATE, FERRITIN, TIBC, IRON, RETICCTPCT in the last 72 hours. Urine analysis:    Component Value Date/Time   COLORURINE YELLOW (A) 01/11/2020 1355   APPEARANCEUR CLEAR (A) 01/11/2020 1355   LABSPEC 1.010 01/11/2020 1355   PHURINE 6.0 01/11/2020 1355   GLUCOSEU NEGATIVE 01/11/2020 1355   HGBUR NEGATIVE 01/11/2020 [place] 01/11/2020 [place] 01/11/2020 [place] 01/11/2020 1355   NITRITE NEGATIVE 01/11/2020 1355   LEUKOCYTESUR NEGATIVE 01/11/2020 1355    Radiological Exams on Admission: DG Chest 2 View  Result Date: 04/17/2020 CLINICAL DATA:  Shortness of breath, history of prior COVID-19 positivity EXAM: CHEST - 2 VIEW COMPARISON:  04/13/2020 FINDINGS: Cardiac shadow is stable. Left-sided chest wall port is again  seen. Patchy bibasilar airspace opacities are again identified but slightly increased when compare with the prior exam. No sizable effusion is noted. No bony abnormality is seen. IMPRESSION: Increase in bibasilar airspace opacities when compared with the prior exam. This likely represents sequelae from the prior COVID-19 infection. Electronically Signed   By: Inez Catalina M.D.   On: 04/17/2020 19:26     Assessment/Plan 60 year old female with history of hypertension and follicular lymphoma, diagnosed with COVID on 12/29,  presenting with worsening cough and shortness of breath, not responding to outpatient treatment with Levaquin for possible superimposed bacterial pneumonia  Bacterial pneumonia Pneumonia due to COVID-19 virus with suspect superimposed bacterial pneumonia - Patient with 3 weeks since COVID diagnosis, with isolated leukopenia and typical findings on CXR - Remdesivir initiated in the ER, though of uncertain therapeutic benefit this far out - No steroids as patient is not hypoxic - Did not respond to outpatient treatment with Levaquin on 1/14 - IV Rocephin and azithromycin, antitussives, vitamins - Supplemental oxygen - CTA chest shows no PE but consistent with covid pneumonia - Follow procalcitonin  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 - Consider inpatient oncology consult    HTN (hypertension) - Continue home antihypertensives    DVT prophylaxis: Lovenox  Code Status: full code  Family Communication:  none  Disposition Plan: Back to previous home environment Consults called: none  Status:At the time of admission, it appears that the appropriate admission status for this patient is INPATIENT. This is judged to be reasonable  and necessary in order to provide the required intensity of service to ensure the patient's safety given the presenting symptoms, physical exam findings, and initial radiographic and laboratory data in the context of their  Comorbid conditions.   Patient requires inpatient status due to high intensity of service, high risk for further deterioration and high frequency of surveillance required.   I certify that at the point of admission it is my clinical judgment that the patient will require inpatient hospital care spanning beyond [place] MD [facility]     04/17/2020, 11:10 PM

## 2020-04-17 NOTE — ED Notes (Signed)
First Nurse Note:  Arrives with daughter who states patient was COVID positive on 12/28.  Recent diagnosis with COVID pneumonia.  Arrives today because patient continues to feel bad.  AAOx3.  Skin warm and dry. No SOB/ DOE noted.

## 2020-04-17 NOTE — ED Triage Notes (Signed)
Patient states she was treated for Covid19 at the end of December and was given medication to treat pneumonia.  Patient states she is not feeling any better.  Patient states she is still having chest pain, shortness of breath and some upper abdominal pain.

## 2020-04-17 NOTE — Progress Notes (Signed)
Remdesivir - Pharmacy Brief Note   A/P:  Remdesivir 200 mg IVPB once followed by 100 mg IVPB daily x 4 days.   Braxden Lovering, PharmD Clinical Pharmacist  

## 2020-04-17 NOTE — ED Provider Notes (Signed)
[facility] Emergency Department Provider Note  ____________________________________________   Event Date/Time   First MD Initiated Contact with Patient 04/17/20 1931     (approximate)  I have reviewed the triage vital signs and the nursing notes.   HISTORY  Chief Complaint Shortness of Breath    HPI Kim Contreras is a 60 y.o. female here with cough, shortness of breath, chest pain, and ongoing symptoms.  The patient was just diagnosed on 12/29 with COVID-19.  She was diagnosed with possible superimposed pneumonia on 1/14 and was given Levaquin.  She has just finished her course.  She states that her cough and shortness of breath have persisted, and possibly mildly worsened.  She said some intermittent chest pain as well.  She also had nausea, general fatigue, and loss of appetite.  She had increasing weakness.  She was advised to come here by her PCP.  She denies any known fevers.  She has history of lymphoma and is currently undergoing treatment, follows with Dr. Burlene Arnt with the oncology clinic.        Past Medical History:  Diagnosis Date  . Anemia 2006  . Anxiety   . Arthritis   . Helicobacter pylori gastritis   . History of hiatal hernia   . Hypertension   . Lymphoma [facility])     Patient Active Problem List   Diagnosis Date Noted  . Drug-induced neutropenia ([place]) 11/21/2019  . Swelling of left lower extremity 11/02/2019  . Goals of care, counseling/discussion 06/13/2019  . Lymphoma of lymph nodes in abdomen ([place]) 06/10/2019  . 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 Lymphoma ([place]) 06/03/2019  . Irregularly shaped mass of abdomen 05/09/2019    Past Surgical History:  Procedure Laterality Date  . APPENDECTOMY    . CHOLECYSTECTOMY N/A 01/19/2015   Procedure: LAPAROSCOPIC CHOLECYSTECTOMY;  [name], MD;  [name];  Service: General;  [name];  . COLONOSCOPY  WITH PROPOFOL N/A 10/27/2014   Procedure: COLONOSCOPY WITH PROPOFOL;  [name], MD;  Location: [facility] ENDOSCOPY;  Service: Endoscopy;  [name];  . ESOPHAGOGASTRODUODENOSCOPY    . EXCISION MASS ABDOMINAL N/A 06/03/2019   Procedure: EXCISION MASS ABDOMINAL;  [name], MD;  [name];  Service: General;  [name];  abdominal node biopsy  . NASAL SINUS SURGERY    . PORTACATH PLACEMENT Left 07/08/2019   Procedure: INSERTION PORT-A-CATH;  [name], MD;  [name];  Service: General;  Laterality: Left;  . TUBAL LIGATION      Prior to Admission medications   Medication Sig Start Date End Date Taking? Authorizing Provider  albuterol (VENTOLIN HFA) 108 (90 Base) MCG/ACT inhaler Inhale 2 puffs into the lungs every 4 (four) hours as needed for wheezing or shortness of breath. 03/28/20   Cuthriell, Charline Bills, PA-C  brompheniramine-pseudoephedrine-DM 30-2-10 MG/5ML syrup Take 10 mLs by mouth 4 (four) times daily as needed. 03/28/20   Cuthriell, Charline Bills, PA-C  gabapentin (NEURONTIN) 100 MG capsule Take by mouth. 01/13/20   [provider]  hydrochlorothiazide (HYDRODIURIL) 12.5 MG tablet Take 12.5 mg by mouth daily. 12/15/19   [provider]  levocetirizine (XYZAL) 5 MG tablet Take 5 mg by mouth every evening.    [provider]  losartan (COZAAR) 50 MG tablet Take 50 mg by mouth daily.    [provider]  meloxicam (MOBIC) 7.5 MG tablet TAKE 1  TABLET(7.5 MG) BY MOUTH DAILY 04/06/20   Verlon Au, NP  mirabegron ER (MYRBETRIQ) 25 MG TB24 tablet Take 1 tablet (25 mg total) by mouth daily. 12/30/19   Hollice Espy, MD  pantoprazole (PROTONIX) 40 MG tablet Take 1 tablet (40 mg total) by mouth daily. 01/11/20   Borders, Kirt Boys, NP  predniSONE (DELTASONE) 50 MG tablet Take 1 tablet (50 mg total) by mouth daily with breakfast. 03/28/20   Cuthriell, Charline Bills, PA-C    Allergies Patient  has no known allergies.  Family History  Problem Relation Age of Onset  . Breast cancer Other 43  . Prostate cancer Neg Hx   . Bladder Cancer Neg Hx   . Kidney cancer Neg Hx     Social History Social History   Tobacco Use  . Smoking status: Never Smoker  . Smokeless tobacco: Never Used  Substance Use Topics  . Alcohol use: No  . Drug use: No    Review of Systems  Review of Systems  Constitutional: Positive for fatigue. Negative for fever.  HENT: Negative for congestion and sore throat.   Eyes: Negative for visual disturbance.  Respiratory: Positive for cough and shortness of breath.   Cardiovascular: Positive for chest pain.  Gastrointestinal: Negative for abdominal pain, diarrhea, nausea and vomiting.  Genitourinary: Negative for flank pain.  Musculoskeletal: Negative for back pain and neck pain.  Skin: Negative for rash and wound.  Neurological: Positive for weakness.  All other systems reviewed and are negative.    ____________________________________________  PHYSICAL EXAM:      VITAL SIGNS: ED Triage Vitals  Enc Vitals Group     BP 04/17/20 1802 125/80     Pulse Rate 04/17/20 1802 93     Resp 04/17/20 1802 18     Temp 04/17/20 1802 99.2 F (37.3 C)     Temp Source 04/17/20 1802 Oral     SpO2 04/17/20 1802 100 %     Weight 04/17/20 1802 149 lb (67.6 kg)     Height 04/17/20 1802 5' (1.524 m)     Head Circumference --      Peak Flow --      Pain Score 04/17/20 1819 8     Pain Loc --      Pain Edu? --      Excl. in [place]? --      Physical Exam Vitals and nursing note reviewed.  Constitutional:      General: She is not in acute distress.    Appearance: She is well-developed.  HENT:     Head: Normocephalic and atraumatic.  Eyes:     Extraocular Movements: Extraocular movements intact.     Conjunctiva/sclera: Conjunctivae normal.     Pupils: Pupils are equal, round, and reactive to light.  Cardiovascular:     Rate and Rhythm: Regular rhythm.  Tachycardia present.     Heart sounds: Normal heart sounds. No murmur heard. No friction rub.  Pulmonary:     Effort: Pulmonary effort is normal. Tachypnea present. No respiratory distress.     Breath sounds: Examination of the right-lower field reveals rales. Examination of the left-lower field reveals rales. Rales present. No wheezing.  Abdominal:     General: There is no distension.     Palpations: Abdomen is soft.     Tenderness: There is no abdominal tenderness.  Musculoskeletal:     Cervical back: Neck supple.  Skin:    General: Skin is warm.     Capillary Refill:  Capillary refill takes less than 2 seconds.  Neurological:     Mental Status: She is alert and oriented to person, place, and time.     Motor: No abnormal muscle tone.       ____________________________________________   LABS (all labs ordered are listed, but only abnormal results are displayed)  Labs Reviewed  BASIC METABOLIC PANEL - Abnormal; Notable for the following components:      Result Value   Glucose, Bld 109 (*)    Calcium 8.6 (*)    All other components within normal limits  CBC - Abnormal; Notable for the following components:   WBC 1.2 (*)    All other components within normal limits  CBC WITH DIFFERENTIAL/PLATELET - Abnormal; Notable for the following components:   WBC 1.3 (*)    Neutro Abs 0.6 (*)    Lymphs Abs 0.5 (*)    All other components within normal limits  CULTURE, BLOOD (ROUTINE X 2)  CULTURE, BLOOD (ROUTINE X 2)  URINALYSIS, COMPLETE (UACMP) WITH MICROSCOPIC  LACTIC ACID, PLASMA  LACTIC ACID, PLASMA  TROPONIN I (HIGH SENSITIVITY)  TROPONIN I (HIGH SENSITIVITY)    ____________________________________________  EKG: Normal sinus rhythm, ventricular rate 85.  PR 160, QRS 80, QTc 435.  No acute ST elevations or depressions. ________________________________________  RADIOLOGY All imaging, including plain films, CT scans, and ultrasounds, independently reviewed by me, and  interpretations confirmed via formal radiology reads.  ED MD interpretation:   Chest x-ray: Reviewed, shows possible worsening bibasilar airspace disease  Official radiology report(s): DG Chest 2 View  Result Date: 04/17/2020 CLINICAL DATA:  Shortness of breath, history of prior COVID-19 positivity EXAM: CHEST - 2 VIEW COMPARISON:  04/13/2020 FINDINGS: Cardiac shadow is stable. Left-sided chest wall port is again seen. Patchy bibasilar airspace opacities are again identified but slightly increased when compare with the prior exam. No sizable effusion is noted. No bony abnormality is seen. IMPRESSION: Increase in bibasilar airspace opacities when compared with the prior exam. This likely represents sequelae from the prior COVID-19 infection. Electronically Signed   By: Inez Catalina M.D.   On: 04/17/2020 19:26    ____________________________________________  PROCEDURES   Procedure(s) performed (including Critical Care):  Procedures  ____________________________________________  INITIAL IMPRESSION / MDM / [place] / ED COURSE  As part of my medical decision making, I reviewed the following data within the [place] notes reviewed and incorporated, Old chart reviewed, Notes from prior ED visits, and [place] Controlled Substance Database       *FRITZIE PRIOLEAU was evaluated in Emergency Department on 04/17/2020 for the symptoms described in the history of present illness. She was evaluated in the context of the global COVID-19 pandemic, which necessitated consideration that the patient might be at risk for infection with the SARS-CoV-2 virus that causes COVID-19. Institutional protocols and algorithms that pertain to the evaluation of patients at risk for COVID-19 are in a state of rapid change based on information released by regulatory bodies including the CDC and federal and state organizations. These policies and algorithms were followed during the patient's  care in the ED.  Some ED evaluations and interventions may be delayed as a result of limited staffing during the pandemic.*     Medical Decision Making: Well-appearing 60 year old female with history of lymphoma, currently on treatment, here with ongoing symptoms of cough, shortness of breath, chest pain, in the setting of COVID-19.  Clinically, she is nontoxic and satting well, but initial lab work  does show significant, persistent neutropenia.  Suspect this is related to her treatment as well as possible viral suppression.  Chest x-ray does show worsening possible multifocal pneumonia.  While she is nontoxic, I discussed at length with her oncologist, who recommends empiric antibiotics and remdesivir given her neutropenia status and ongoing symptoms.  Feel this is very reasonable given her comorbidities.  Given her ongoing chest pain, will obtain a CT angio to evaluate for PE.  Will admit to medicine for  ____________________________________________  FINAL CLINICAL IMPRESSION(S) / ED DIAGNOSES  Final diagnoses:  COVID-19     MEDICATIONS GIVEN DURING THIS VISIT:  Medications  sodium chloride 0.9 % bolus 1,000 mL (has no administration in time range)  ceFEPIme (MAXIPIME) 2 g in sodium chloride 0.9 % 100 mL IVPB (has no administration in time range)  vancomycin (VANCOCIN) IVPB 1000 mg/200 mL premix (has no administration in time range)  remdesivir 200 mg in sodium chloride 0.9% 250 mL IVPB (has no administration in time range)    Followed by  remdesivir 100 mg in sodium chloride 0.9 % 100 mL IVPB (has no administration in time range)     ED Discharge Orders    None       Note:  This document was prepared using Dragon voice recognition software and may include unintentional dictation errors.   Duffy Bruce, MD 04/17/20 [phone]

## 2020-04-17 NOTE — Progress Notes (Signed)
PHARMACY -  BRIEF ANTIBIOTIC NOTE   Pharmacy has received consult(s) for Cefepime and Vancomycin from an ED provider.  The patient's profile has been reviewed for ht/wt/allergies/indication/available labs.    One time order(s) placed for Cefepime 2gm and Vancomycin 1gm  Further antibiotics/pharmacy consults should be ordered by admitting physician if indicated.                       Thank you, Hart Robinsons A 04/17/2020  11:24 PM

## 2020-04-17 NOTE — ED Provider Notes (Incomplete)
[facility] Emergency Department Provider Note  ____________________________________________   Event Date/Time   First MD Initiated Contact with Patient 04/17/20 1931     (approximate)  I have reviewed the triage vital signs and the nursing notes.   HISTORY  Chief Complaint Shortness of Breath    HPI Kim Contreras is a 60 y.o. female  ***        Past Medical History:  Diagnosis Date  . Anemia 2006  . Anxiety   . Arthritis   . Helicobacter pylori gastritis   . History of hiatal hernia   . Hypertension   . Lymphoma [facility])     Patient Active Problem List   Diagnosis Date Noted  . Drug-induced neutropenia ([place]) 11/21/2019  . Swelling of left lower extremity 11/02/2019  . Goals of care, counseling/discussion 06/13/2019  . Lymphoma of lymph nodes in abdomen ([place]) 06/10/2019  . 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 Lymphoma ([place]) 06/03/2019  . Irregularly shaped mass of abdomen 05/09/2019    Past Surgical History:  Procedure Laterality Date  . APPENDECTOMY    . CHOLECYSTECTOMY N/A 01/19/2015   Procedure: LAPAROSCOPIC CHOLECYSTECTOMY;  [name], MD;  [name];  Service: General;  [name];  . COLONOSCOPY WITH PROPOFOL N/A 10/27/2014   Procedure: COLONOSCOPY WITH PROPOFOL;  [name], MD;  Location[facility] ENDOSCOPY;  Service: Endoscopy;  [name];  . ESOPHAGOGASTRODUODENOSCOPY    . EXCISION MASS ABDOMINAL N/A 06/03/2019   Procedure: EXCISION MASS ABDOMINAL;  [name], MD;  [name];  Service: General;  [name];  abdominal node biopsy  . NASAL SINUS SURGERY    . PORTACATH PLACEMENT Left 07/08/2019   Procedure: INSERTION PORT-A-CATH;  [name], MD;  [name];  Service: General;  Laterality: Left;  . TUBAL LIGATION      Prior to Admission medications   Medication Sig Start Date  End Date Taking? Authorizing Provider  albuterol (VENTOLIN HFA) 108 (90 Base) MCG/ACT inhaler Inhale 2 puffs into the lungs every 4 (four) hours as needed for wheezing or shortness of breath. 03/28/20   Cuthriell, Charline Bills, PA-C  brompheniramine-pseudoephedrine-DM 30-2-10 MG/5ML syrup Take 10 mLs by mouth 4 (four) times daily as needed. 03/28/20   Cuthriell, Charline Bills, PA-C  gabapentin (NEURONTIN) 100 MG capsule Take by mouth. 01/13/20   [provider]  hydrochlorothiazide (HYDRODIURIL) 12.5 MG tablet Take 12.5 mg by mouth daily. 12/15/19   [provider]  levocetirizine (XYZAL) 5 MG tablet Take 5 mg by mouth every evening.    [provider]  losartan (COZAAR) 50 MG tablet Take 50 mg by mouth daily.    [provider]  meloxicam (MOBIC) 7.5 MG tablet TAKE 1 TABLET(7.5 MG) BY MOUTH DAILY 04/06/20   Verlon Au, NP  mirabegron ER (MYRBETRIQ) 25 MG TB24 tablet Take 1 tablet (25 mg total) by mouth daily. 12/30/19   Hollice Espy, MD  pantoprazole (PROTONIX) 40 MG tablet Take 1 tablet (40 mg total) by mouth daily. 01/11/20   Borders, Kirt Boys, NP  predniSONE (DELTASONE) 50 MG tablet Take 1 tablet (50 mg total) by mouth daily with breakfast. 03/28/20   Cuthriell, Charline Bills, PA-C    Allergies Patient has no known allergies.  Family History  Problem Relation Age of Onset  . Breast cancer Other 43  . Prostate cancer Neg Hx   . Bladder Cancer  Neg Hx   . Kidney cancer Neg Hx     Social History Social History   Tobacco Use  . Smoking status: Never Smoker  . Smokeless tobacco: Never Used  Substance Use Topics  . Alcohol use: No  . Drug use: No    Review of Systems  Review of Systems   ____________________________________________  PHYSICAL EXAM:      VITAL SIGNS: ED Triage Vitals  Enc Vitals Group     BP 04/17/20 1802 125/80     Pulse Rate 04/17/20 1802 93     Resp 04/17/20 1802 18     Temp 04/17/20 1802 99.2 F (37.3 C)     Temp Source  04/17/20 1802 Oral     SpO2 04/17/20 1802 100 %     Weight 04/17/20 1802 149 lb (67.6 kg)     Height 04/17/20 1802 5' (1.524 m)     Head Circumference --      Peak Flow --      Pain Score 04/17/20 1819 8     Pain Loc --      Pain Edu? --      Excl. in [place]? --      Physical Exam    ____________________________________________   LABS (all labs ordered are listed, but only abnormal results are displayed)  Labs Reviewed  BASIC METABOLIC PANEL - Abnormal; Notable for the following components:      Result Value   Glucose, Bld 109 (*)    Calcium 8.6 (*)    All other components within normal limits  CBC - Abnormal; Notable for the following components:   WBC 1.2 (*)    All other components within normal limits  URINALYSIS, COMPLETE (UACMP) WITH MICROSCOPIC    ____________________________________________  EKG: *** ________________________________________  RADIOLOGY All imaging, including plain films, CT scans, and ultrasounds, independently reviewed by me, and interpretations confirmed via formal radiology reads.  ED MD interpretation:   ***  Official radiology report(s): DG Chest 2 View  Result Date: 04/17/2020 CLINICAL DATA:  Shortness of breath, history of prior COVID-19 positivity EXAM: CHEST - 2 VIEW COMPARISON:  04/13/2020 FINDINGS: Cardiac shadow is stable. Left-sided chest wall port is again seen. Patchy bibasilar airspace opacities are again identified but slightly increased when compare with the prior exam. No sizable effusion is noted. No bony abnormality is seen. IMPRESSION: Increase in bibasilar airspace opacities when compared with the prior exam. This likely represents sequelae from the prior COVID-19 infection. Electronically Signed   By: Inez Catalina M.D.   On: 04/17/2020 19:26    ____________________________________________  PROCEDURES   Procedure(s) performed (including Critical  Care):  Procedures  ____________________________________________  INITIAL IMPRESSION / MDM / [place] / ED COURSE  As part of my medical decision making, I reviewed the following data within the [place] notes reviewed and incorporated, Old chart reviewed, Notes from prior ED visits, and [place] Controlled Substance Database       *Kim Contreras was evaluated in Emergency Department on 04/17/2020 for the symptoms described in the history of present illness. She was evaluated in the context of the global COVID-19 pandemic, which necessitated consideration that the patient might be at risk for infection with the SARS-CoV-2 virus that causes COVID-19. Institutional protocols and algorithms that pertain to the evaluation of patients at risk for COVID-19 are in a state of rapid change based on information released by regulatory bodies including the CDC and federal and state organizations. These  policies and algorithms were followed during the patient's care in the ED.  Some ED evaluations and interventions may be delayed as a result of limited staffing during the pandemic.*     Medical Decision Making:  ***  ____________________________________________  FINAL CLINICAL IMPRESSION(S) / ED DIAGNOSES  Final diagnoses:  None     MEDICATIONS GIVEN DURING THIS VISIT:  Medications - No data to display   ED Discharge Orders    None       Note:  This document was prepared using Dragon voice recognition software and may include unintentional dictation errors.

## 2020-04-18 ENCOUNTER — Inpatient Hospital Stay: Payer: Commercial Managed Care - PPO

## 2020-04-18 ENCOUNTER — Encounter: Payer: Self-pay | Admitting: Internal Medicine

## 2020-04-18 DIAGNOSIS — C8238 Follicular lymphoma grade IIIa, lymph nodes of multiple sites: Secondary | ICD-10-CM

## 2020-04-18 DIAGNOSIS — T451X5A Adverse effect of antineoplastic and immunosuppressive drugs, initial encounter: Secondary | ICD-10-CM

## 2020-04-18 DIAGNOSIS — I10 Essential (primary) hypertension: Secondary | ICD-10-CM

## 2020-04-18 DIAGNOSIS — D701 Agranulocytosis secondary to cancer chemotherapy: Secondary | ICD-10-CM

## 2020-04-18 DIAGNOSIS — E876 Hypokalemia: Secondary | ICD-10-CM

## 2020-04-18 LAB — URINALYSIS, COMPLETE (UACMP) WITH MICROSCOPIC
Bacteria, UA: NONE SEEN
Bilirubin Urine: NEGATIVE
Glucose, UA: NEGATIVE mg/dL
Hgb urine dipstick: NEGATIVE
Ketones, ur: 5 mg/dL — AB
Nitrite: NEGATIVE
Protein, ur: NEGATIVE mg/dL
Specific Gravity, Urine: 1.011 (ref 1.005–1.030)
Squamous Epithelial / HPF: NONE SEEN (ref 0–5)
pH: 6 (ref 5.0–8.0)

## 2020-04-18 LAB — COMPREHENSIVE METABOLIC PANEL
ALT: 20 U/L (ref 0–44)
AST: 26 U/L (ref 15–41)
Albumin: 2.9 g/dL — ABNORMAL LOW (ref 3.5–5.0)
Alkaline Phosphatase: 66 U/L (ref 38–126)
Anion gap: 8 (ref 5–15)
BUN: 8 mg/dL (ref 6–20)
CO2: 24 mmol/L (ref 22–32)
Calcium: 8.2 mg/dL — ABNORMAL LOW (ref 8.9–10.3)
Chloride: 106 mmol/L (ref 98–111)
Creatinine, Ser: 0.55 mg/dL (ref 0.44–1.00)
GFR, Estimated: >60 mL/min (ref >60)
Glucose, Bld: 121 mg/dL — ABNORMAL HIGH (ref 70–99)
Potassium: 3.3 mmol/L — ABNORMAL LOW (ref 3.5–5.1)
Sodium: 138 mmol/L (ref 135–145)
Total Bilirubin: 0.6 mg/dL (ref 0.3–1.2)
Total Protein: 6.1 g/dL — ABNORMAL LOW (ref 6.5–8.1)

## 2020-04-18 LAB — CBC WITH DIFFERENTIAL/PLATELET
Abs Immature Granulocytes: 0.08 10*3/uL — ABNORMAL HIGH (ref 0.00–0.07)
Basophils Absolute: 0 10*3/uL (ref 0.0–0.1)
Basophils Relative: 0 %
Eosinophils Absolute: 0 10*3/uL (ref 0.0–0.5)
Eosinophils Relative: 0 %
HCT: 32.1 % — ABNORMAL LOW (ref 36.0–46.0)
Hemoglobin: 10.9 g/dL — ABNORMAL LOW (ref 12.0–15.0)
Immature Granulocytes: 8 %
Lymphocytes Relative: 17 %
Lymphs Abs: 0.2 10*3/uL — ABNORMAL LOW (ref 0.7–4.0)
MCH: 33.3 pg (ref 26.0–34.0)
MCHC: 34 g/dL (ref 30.0–36.0)
MCV: 98.2 fL (ref 80.0–100.0)
Monocytes Absolute: 0.2 10*3/uL (ref 0.1–1.0)
Monocytes Relative: 24 %
Neutro Abs: 0.5 10*3/uL — ABNORMAL LOW (ref 1.7–7.7)
Neutrophils Relative %: 51 %
Platelets: 144 10*3/uL — ABNORMAL LOW (ref 150–400)
RBC: 3.27 MIL/uL — ABNORMAL LOW (ref 3.87–5.11)
RDW: 13.1 % (ref 11.5–15.5)
Smear Review: NORMAL
WBC: 1 10*3/uL — CL (ref 4.0–10.5)
nRBC: 0 % (ref 0.0–0.2)

## 2020-04-18 LAB — FIBRIN DERIVATIVES D-DIMER (ARMC ONLY): Fibrin derivatives D-dimer (ARMC): 1431.13 ng/mL (FEU) — ABNORMAL HIGH (ref 0.00–499.00)

## 2020-04-18 LAB — CREATININE, SERUM
Creatinine, Ser: 0.66 mg/dL (ref 0.44–1.00)
GFR, Estimated: >60 mL/min (ref >60)

## 2020-04-18 LAB — TROPONIN I (HIGH SENSITIVITY): Troponin I (High Sensitivity): 6 ng/L (ref <18)

## 2020-04-18 LAB — C-REACTIVE PROTEIN: CRP: 3.2 mg/dL — ABNORMAL HIGH (ref <1.0)

## 2020-04-18 LAB — LACTIC ACID, PLASMA
Lactic Acid, Venous: 1 mmol/L (ref 0.5–1.9)
Lactic Acid, Venous: 1 mmol/L (ref 0.5–1.9)

## 2020-04-18 LAB — PATHOLOGIST SMEAR REVIEW

## 2020-04-18 LAB — PROCALCITONIN: Procalcitonin: <0.1 ng/mL

## 2020-04-18 LAB — HIV ANTIBODY (ROUTINE TESTING W REFLEX): HIV Screen 4th Generation wRfx: NONREACTIVE

## 2020-04-18 MED ORDER — LORATADINE 10 MG PO TABS
10.0000 mg | ORAL_TABLET | Freq: Every day | ORAL | Status: DC
  Administered 2020-04-19 – 2020-04-20 (×2): 10 mg via ORAL
  Filled 2020-04-18 (×2): qty 1

## 2020-04-18 MED ORDER — TBO-FILGRASTIM 480 MCG/0.8ML ~~LOC~~ SOSY
480.0000 ug | PREFILLED_SYRINGE | Freq: Every day | SUBCUTANEOUS | Status: DC
  Filled 2020-04-18: qty 0.8

## 2020-04-18 MED ORDER — LEVOCETIRIZINE DIHYDROCHLORIDE 5 MG PO TABS: 5.0000 mg | ORAL_TABLET | Freq: Every evening | ORAL | Status: DC

## 2020-04-18 MED ORDER — MONTELUKAST SODIUM 10 MG PO TABS
10.0000 mg | ORAL_TABLET | Freq: Every evening | ORAL | Status: DC
  Administered 2020-04-18 – 2020-04-19 (×2): 10 mg via ORAL
  Filled 2020-04-18 (×2): qty 1

## 2020-04-18 MED ORDER — PANTOPRAZOLE SODIUM 40 MG PO TBEC
40.0000 mg | DELAYED_RELEASE_TABLET | Freq: Every day | ORAL | Status: DC
  Administered 2020-04-18 – 2020-04-20 (×3): 40 mg via ORAL
  Filled 2020-04-18 (×3): qty 1

## 2020-04-18 MED ORDER — ALBUTEROL SULFATE HFA 108 (90 BASE) MCG/ACT IN AERS
2.0000 | INHALATION_SPRAY | RESPIRATORY_TRACT | Status: DC | PRN
  Filled 2020-04-18: qty 6.7

## 2020-04-18 MED ORDER — IOHEXOL 350 MG/ML SOLN
75.0000 mL | Freq: Once | INTRAVENOUS | Status: AC | PRN
  Administered 2020-04-18: 75 mL via INTRAVENOUS
  Filled 2020-04-18: qty 75

## 2020-04-18 MED ORDER — TBO-FILGRASTIM 480 MCG/0.8ML ~~LOC~~ SOSY
480.0000 ug | PREFILLED_SYRINGE | Freq: Every day | SUBCUTANEOUS | Status: AC
  Administered 2020-04-18 – 2020-04-19 (×2): 480 ug via SUBCUTANEOUS
  Filled 2020-04-18 (×2): qty 0.8

## 2020-04-18 MED ORDER — POTASSIUM CHLORIDE CRYS ER 20 MEQ PO TBCR
40.0000 meq | EXTENDED_RELEASE_TABLET | Freq: Once | ORAL | Status: AC
  Administered 2020-04-18: 11:00:00 40 meq via ORAL
  Filled 2020-04-18: qty 2

## 2020-04-18 NOTE — Progress Notes (Signed)
1        [facility] NAME: Kim Contreras    MR#:  [id number]  DATE OF BIRTH:  May 14, 1960  SUBJECTIVE:  CHIEF COMPLAINT:   Chief Complaint  Patient presents with  . Shortness of Breath  Feels better.  Denies main symptom at this time REVIEW OF SYSTEMS:  Review of Systems  Constitutional: Positive for malaise/fatigue. Negative for diaphoresis, fever and weight loss.  HENT: Negative for ear discharge, ear pain, hearing loss, nosebleeds, sore throat and tinnitus.   Eyes: Negative for blurred vision and pain.  Respiratory: Positive for shortness of breath. Negative for cough, hemoptysis and wheezing.   Cardiovascular: Negative for chest pain, palpitations, orthopnea and leg swelling.  Gastrointestinal: Negative for abdominal pain, blood in stool, constipation, diarrhea, heartburn, nausea and vomiting.  Genitourinary: Negative for dysuria, frequency and urgency.  Musculoskeletal: Negative for back pain and myalgias.  Skin: Negative for itching and rash.  Neurological: Negative for dizziness, tingling, tremors, focal weakness, seizures, weakness and headaches.  Psychiatric/Behavioral: Negative for depression. The patient is not nervous/anxious.    DRUG ALLERGIES:  No Known Allergies VITALS:  Blood pressure 110/83, pulse 77, temperature 97.7 F (36.5 C), resp. rate 18, height 5' (1.524 m), weight 80.6 kg, last menstrual period 01/18/2005, SpO2 100 %. PHYSICAL EXAMINATION:  Physical Exam HENT:     Head: Normocephalic and atraumatic.  Eyes:     Extraocular Movements: EOM normal.     Conjunctiva/sclera: Conjunctivae normal.     Pupils: Pupils are equal, round, and reactive to light.  Neck:     Thyroid: No thyromegaly.     Trachea: No tracheal deviation.  Cardiovascular:     Rate and Rhythm: Normal rate and regular rhythm.     Heart sounds: Normal heart sounds.  Pulmonary:     Effort: Pulmonary effort is normal. No respiratory distress.     Breath  sounds: Normal breath sounds. No wheezing.  Chest:     Chest wall: No tenderness.  Abdominal:     General: Bowel sounds are normal. There is no distension.     Palpations: Abdomen is soft.     Tenderness: There is no abdominal tenderness.  Musculoskeletal:        General: Normal range of motion.     Cervical back: Normal range of motion and neck supple.  Skin:    General: Skin is warm and dry.     Findings: No rash.  Neurological:     Mental Status: She is alert and oriented to person, place, and time.     Cranial Nerves: No cranial nerve deficit.    LABORATORY PANEL:  Female CBC Recent Labs  Lab 04/18/20 0506  WBC 1.0*  HGB 10.9*  HCT 32.1*  PLT 144*   ------------------------------------------------------------------------------------------------------------------ Chemistries  Recent Labs  Lab 04/18/20 0506  NA 138  K 3.3*  CL 106  CO2 24  GLUCOSE 121*  BUN 8  CREATININE 0.55  CALCIUM 8.2*  AST 26  ALT 20  ALKPHOS 66  BILITOT 0.6   RADIOLOGY:  DG Chest 2 View  Result Date: 04/17/2020 CLINICAL DATA:  Shortness of breath, history of prior COVID-19 positivity EXAM: CHEST - 2 VIEW COMPARISON:  04/13/2020 FINDINGS: Cardiac shadow is stable. Left-sided chest wall port is again seen. Patchy bibasilar airspace opacities are again identified but slightly increased when compare with the prior exam. No sizable effusion is noted. No bony abnormality is seen. IMPRESSION: Increase in bibasilar airspace opacities  when compared with the prior exam. This likely represents sequelae from the prior COVID-19 infection. Electronically Signed   By: Inez Catalina M.D.   On: 04/17/2020 19:26   CT Angio Chest PE W and/or Wo Contrast  Result Date: 04/18/2020 CLINICAL DATA:  Shortness of breath, COVID positive EXAM: CT ANGIOGRAPHY CHEST WITH CONTRAST TECHNIQUE: Multidetector CT imaging of the chest was performed using the standard protocol during bolus administration of intravenous contrast.  Multiplanar CT image reconstructions and MIPs were obtained to evaluate the vascular anatomy. CONTRAST:  40mL OMNIPAQUE IOHEXOL 350 MG/ML SOLN COMPARISON:  None. FINDINGS: Cardiovascular: There is a optimal opacification of the pulmonary arteries. There is no central,segmental, or subsegmental filling defects within the pulmonary arteries. The heart is normal in size. No pericardial effusion or thickening. No evidence right heart strain. There is normal three-vessel brachiocephalic anatomy without proximal stenosis. The thoracic aorta is normal in appearance. Mediastinum/Nodes: No hilar, mediastinal, or axillary adenopathy. Thyroid gland, trachea, and esophagus demonstrate no significant findings. A left-sided MediPort catheter seen with the tip at the superior cavoatrial junction. Lungs/Pleura: Hazy patchy airspace opacities are seen throughout both lungs, right greater than left. This is also most notable at both lung apices. No pleural effusion or pneumothorax. Upper Abdomen: No acute abnormalities present in the visualized portions of the upper abdomen. Musculoskeletal: No chest wall abnormality. No acute or significant osseous findings. Review of the MIP images confirms the above findings. IMPRESSION: No central, segmental, or subsegmental pulmonary embolism Multifocal patchy airspace opacities throughout both lungs, consistent with COVID pneumonia Electronically Signed   By: Prudencio Pair M.D.   On: 04/18/2020 00:50   ASSESSMENT AND PLAN:  60 year old female with a known history of follicular lymphoma, hypertension who was diagnosed with COVID-19 on 12/29 and also treated with Levaquin for possible superimposed bacterial pneumonia from 1/14-1/18 was sent over by her PCP due to worsening cough and shortness of breath.  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  Discussed with Dr. Rogue Bussing via secure chat Likely due  to recent COVID 19 infection with underlying lymphoma and chemotherapy  Hypokalemia Replete and recheck  COVID-19 infection I will stop remdesivir at this point as I am uncertain of its therapeutic benefit this far out Hold off steroids because she is not hypoxic Her procalcitonin is normal so I will hold off any antibiotic.  She already had received a course of Levaquin as an outpatient I will ask pulmonary to see if they have any input considering her CT showed multifocal pneumonia She does not have much significant symptoms from this  Grade 3A follicular lymphoma Followed by Dr. Rogue Bussing at cancer center.  He is aware  Hypertension Stable  Body mass index is 34.7 kg/m.      Status is: Inpatient  Remains inpatient appropriate because:Inpatient level of care appropriate due to severity of illness   Dispo: The patient is from: Home              Anticipated d/c is to: Home              Anticipated d/c date is: 3 days              Patient currently is not medically stable to d/c.  Severe leukopenia requiring treatment and pending pulmonary eval    DVT prophylaxis:       enoxaparin (LOVENOX) injection 40 mg Start: 04/18/20 1000  Family Communication: Updated patient's daughter [name] over phone at [phone] on 1/19   All the records are reviewed and case discussed with Care Management/Social Worker. Management plans discussed with the patient, family and they are in agreement.  CODE STATUS: Full Code  TOTAL TIME TAKING CARE OF THIS PATIENT: 35 minutes.   More than 50% of the time was spent in counseling/coordination of care: YES  POSSIBLE D/C IN 2-3 DAYS, DEPENDING ON CLINICAL CONDITION.   Max Sane M.D on 04/18/2020 at 2:22 PM  [facility]   CC: Primary care physician; Center, [facility]  Note: This dictation was prepared with Diplomatic Services operational officer dictation along with smaller phrase technology. Any transcriptional errors that result from  this process are unintentional.

## 2020-04-18 NOTE — Plan of Care (Signed)
  Problem: Education: Goal: Knowledge of General Education information will improve Description Including pain rating scale, medication(s)/side effects and non-pharmacologic comfort measures Outcome: Progressing   

## 2020-04-18 NOTE — Progress Notes (Signed)
Nightly update given to daughter Kim Contreras 

## 2020-04-19 DIAGNOSIS — J159 Unspecified bacterial pneumonia: Secondary | ICD-10-CM

## 2020-04-19 LAB — CBC WITH DIFFERENTIAL/PLATELET
Abs Immature Granulocytes: 0.24 10*3/uL — ABNORMAL HIGH (ref 0.00–0.07)
Basophils Absolute: 0 10*3/uL (ref 0.0–0.1)
Basophils Relative: 0 %
Eosinophils Absolute: 0 10*3/uL (ref 0.0–0.5)
Eosinophils Relative: 0 %
HCT: 33.2 % — ABNORMAL LOW (ref 36.0–46.0)
Hemoglobin: 11.4 g/dL — ABNORMAL LOW (ref 12.0–15.0)
Immature Granulocytes: 3 %
Lymphocytes Relative: 4 %
Lymphs Abs: 0.3 10*3/uL — ABNORMAL LOW (ref 0.7–4.0)
MCH: 33.8 pg (ref 26.0–34.0)
MCHC: 34.3 g/dL (ref 30.0–36.0)
MCV: 98.5 fL (ref 80.0–100.0)
Monocytes Absolute: 0.5 10*3/uL (ref 0.1–1.0)
Monocytes Relative: 7 %
Neutro Abs: 6.2 10*3/uL (ref 1.7–7.7)
Neutrophils Relative %: 86 %
Platelets: 146 10*3/uL — ABNORMAL LOW (ref 150–400)
RBC: 3.37 MIL/uL — ABNORMAL LOW (ref 3.87–5.11)
RDW: 13.1 % (ref 11.5–15.5)
Smear Review: NORMAL
WBC: 7.2 10*3/uL (ref 4.0–10.5)
nRBC: 0 % (ref 0.0–0.2)

## 2020-04-19 LAB — COMPREHENSIVE METABOLIC PANEL
ALT: 22 U/L (ref 0–44)
AST: 25 U/L (ref 15–41)
Albumin: 3.1 g/dL — ABNORMAL LOW (ref 3.5–5.0)
Alkaline Phosphatase: 71 U/L (ref 38–126)
Anion gap: 7 (ref 5–15)
BUN: 8 mg/dL (ref 6–20)
CO2: 26 mmol/L (ref 22–32)
Calcium: 8.2 mg/dL — ABNORMAL LOW (ref 8.9–10.3)
Chloride: 107 mmol/L (ref 98–111)
Creatinine, Ser: 0.64 mg/dL (ref 0.44–1.00)
GFR, Estimated: >60 mL/min (ref >60)
Glucose, Bld: 104 mg/dL — ABNORMAL HIGH (ref 70–99)
Potassium: 3.5 mmol/L (ref 3.5–5.1)
Sodium: 140 mmol/L (ref 135–145)
Total Bilirubin: 0.5 mg/dL (ref 0.3–1.2)
Total Protein: 6.2 g/dL — ABNORMAL LOW (ref 6.5–8.1)

## 2020-04-19 LAB — C-REACTIVE PROTEIN: CRP: 2.8 mg/dL — ABNORMAL HIGH (ref <1.0)

## 2020-04-19 LAB — D-DIMER, QUANTITATIVE: D-Dimer, Quant: 1.7 ug/mL-FEU — ABNORMAL HIGH (ref 0.00–0.50)

## 2020-04-19 MED ORDER — COLCHICINE 0.6 MG PO TABS
0.6000 mg | ORAL_TABLET | Freq: Every day | ORAL | Status: DC
  Administered 2020-04-19 – 2020-04-20 (×2): 0.6 mg via ORAL
  Filled 2020-04-19 (×2): qty 1

## 2020-04-19 NOTE — Progress Notes (Signed)
Patient's daughter [name] updated via phone per request.

## 2020-04-19 NOTE — Progress Notes (Signed)
OT Screen Note  Patient Details Name: Kim Contreras MRN: [id number] DOB: 04/25/1960   Cancelled Treatment:    Reason Eval/Treat Not Completed: OT screened, no needs identified, will sign off. Consult received, chart reviewed. Spoke with PT following evaluation. Pt is at baseline independence. No difficulty with ADL and mobility at this time. No skilled OT needs at this time, will sign off.   Jeni Salles, MPH, MS, OTR/L ascom [phone] 04/19/20, 3:03 PM

## 2020-04-19 NOTE — Evaluation (Signed)
Physical Therapy Evaluation Patient Details Name: Kim Contreras MRN: [id number] DOB: 1960-08-07 Today's Date: 04/19/2020   History of Present Illness  Kim Contreras is a 60 y.o. female with medical history significant for follicular lymphoma, and hypertension, diagnosed with COVID on 12/29 and treated with Levaquin for possible superimposed pneumonia from 1/14-1/18 who was sent in by her PCP due to worsening cough and shortness of breath.  Has an associated headache, sore throat, impaired taste and decreased appetite and states she had a fever up until a few days ago.  Has been having intermittent chest pain, of moderate intensity, nonradiating, worse on coughing and generalized malaise.  Clinical Impression  Patient received in bed resting. She is agreeable to PT assessment. Patient is independent with bed mobility and transfers. Able to don boots while sitting edge of bed. Ambulated ~45 feet in room, no AD, no lob. She performed toileting independently in bathroom. Demonstrates good balance. She appears to be near baseline level of function just slightly weakened due to illness. Patient will be safe to return home at discharge with no further needs identified at this time.     Follow Up Recommendations No PT follow up    Equipment Recommendations  None recommended by PT    Recommendations for Other Services       Precautions / Restrictions Precautions Precautions: None Restrictions Weight Bearing Restrictions: No      Mobility  Bed Mobility Overal bed mobility: Modified Independent                  Transfers Overall transfer level: Modified independent Equipment used: None                Ambulation/Gait Ambulation/Gait assistance: Supervision Gait Distance (Feet): 45 Feet Assistive device: None Gait Pattern/deviations: Step-through pattern Gait velocity: decr   General Gait Details: patient is generally safe with mobility. Was kept in room per RN request as  she has lymphoma and Covid +.  Stairs            Wheelchair Mobility    Modified Rankin (Stroke Patients Only)       Balance Overall balance assessment: Modified Independent                                           Pertinent Vitals/Pain Pain Assessment: No/denies pain    Home Living Family/patient expects to be discharged to:: Private residence Living Arrangements: Spouse/significant other;Children Available Help at Discharge: Family;Available PRN/intermittently                  Prior Function                 Hand Dominance        Extremity/Trunk Assessment   Upper Extremity Assessment Upper Extremity Assessment: Generalized weakness    Lower Extremity Assessment Lower Extremity Assessment: Generalized weakness    Cervical / Trunk Assessment Cervical / Trunk Assessment: Normal  Communication      Cognition Arousal/Alertness: Awake/alert Behavior During Therapy: WFL for tasks assessed/performed Overall Cognitive Status: Within Functional Limits for tasks assessed                                        General Comments      Exercises  Assessment/Plan    PT Assessment Patent does not need any further PT services  PT Problem List         PT Treatment Interventions Gait training;Functional mobility training;Patient/family education    PT Goals (Current goals can be found in the Care Plan section)  Acute Rehab PT Goals Patient Stated Goal: none stated PT Goal Formulation: With patient Time For Goal Achievement: 04/21/20    Frequency Min 2X/week   Barriers to discharge        Co-evaluation               AM-PAC PT "6 Clicks" Mobility  Outcome Measure Help needed turning from your back to your side while in a flat bed without using bedrails?: None Help needed moving from lying on your back to sitting on the side of a flat bed without using bedrails?: None Help needed moving to and  from a bed to a chair (including a wheelchair)?: None Help needed standing up from a chair using your arms (e.g., wheelchair or bedside chair)?: None Help needed to walk in hospital room?: None Help needed climbing 3-5 steps with a railing? : None 6 Click Score: 24    End of Session   Activity Tolerance: Patient tolerated treatment well Patient left: in bed;with call bell/phone within reach Nurse Communication: Mobility status      Time: 1425-1445 PT Time Calculation (min) (ACUTE ONLY): 20 min   Charges:   PT Evaluation $PT Eval Low Complexity: 1 Low PT Treatments $Therapeutic Activity: 8-22 mins        Kim Contreras, PT, GCS 04/19/20,3:01 PM

## 2020-04-19 NOTE — Consult Note (Signed)
Pulmonary Medicine          Date: 04/19/2020,   MRN# [id number] Kim Contreras Aug 22, 1960     AdmissionWeight: 67.6 kg                 CurrentWeight: 80.6 kg  Referring physician: Dr Manuella Ghazi    CHIEF COMPLAINT:   Severe dyspnea due to [place] is a 60 y.o. female with medical history significant for follicular lymphoma, and hypertension, diagnosed with COVID on 12/29 and treated with Levaquin for possible superimposed pneumonia from 1/14-1/18 who was sent in by her PCP due to worsening cough and shortness of breath.  Has an associated headache, sore throat, impaired taste and decreased appetite and states she had a fever up until a few days ago.  Has been having intermittent chest pain, of moderate intensity, nonradiating, worse on coughing and generalized malaise. On arrival she had a low-grade temperature of 99.2 with otherwise normal vitals.  On her blood work she was leukopenic with white cell count of 1300, down from 1500 a month prior  CBC otherwise unremarkable.  BMP normal.  Troponin 7. EKG  NSR at 85 with nonspecific ST-T wave changes  Imaging: Chest x-ray showed increasing bibasilar airspace opacities when compared chest x-ray on 1/14, likely represents sequelae from prior COVID-19 infection. CTA chest reviewed by me and is as per report below. Patient started on remdesivir. PCCM consultation for severe progressive dyspnea with prolonged COVID19 course.     PAST MEDICAL HISTORY   Past Medical History:  Diagnosis Date  . Anemia 2006  . Anxiety   . Arthritis   . Helicobacter pylori gastritis   . History of hiatal hernia   . Hypertension   . Lymphoma [facility])      SURGICAL HISTORY   Past Surgical History:  Procedure Laterality Date  . APPENDECTOMY    . CHOLECYSTECTOMY N/A 01/19/2015   Procedure: LAPAROSCOPIC CHOLECYSTECTOMY;  [name], MD;  [name];  Service: General;  Laterality:  N/A;  . COLONOSCOPY WITH PROPOFOL N/A 10/27/2014   Procedure: COLONOSCOPY WITH PROPOFOL;  [name], MD;  Location: [facility] ENDOSCOPY;  Service: Endoscopy;  [name];  . ESOPHAGOGASTRODUODENOSCOPY    . EXCISION MASS ABDOMINAL N/A 06/03/2019   Procedure: EXCISION MASS ABDOMINAL;  [name], MD;  [name];  Service: General;  [name];  abdominal node biopsy  . NASAL SINUS SURGERY    . PORTACATH PLACEMENT Left 07/08/2019   Procedure: INSERTION PORT-A-CATH;  [name], MD;  [name];  Service: General;  Laterality: Left;  . TUBAL LIGATION       FAMILY HISTORY   Family History  Problem Relation Age of Onset  . Breast cancer Other 42  . Prostate cancer Neg Hx   . Bladder Cancer Neg Hx   . Kidney cancer Neg Hx      SOCIAL HISTORY   Social History   Tobacco Use  . Smoking status: Never Smoker  . Smokeless tobacco: Never Used  Substance Use Topics  . Alcohol use: No  . Drug use: No     MEDICATIONS    Home Medication:    Current Medication:  Current Facility-Administered Medications:  .  acetaminophen (TYLENOL) tablet 650 mg, 650 mg, Oral, Q6H PRN, Athena Masse, MD, 650 mg at 04/18/20 1418 .  albuterol (VENTOLIN HFA) 108 (90 Base) MCG/ACT inhaler  2 puff, 2 puff, Inhalation, Q6H, Athena Masse, MD, 2 puff at 04/18/20 1938 .  albuterol (VENTOLIN HFA) 108 (90 Base) MCG/ACT inhaler 2 puff, 2 puff, Inhalation, Q4H PRN, Manuella Ghazi, Vipul, MD .  ascorbic acid (VITAMIN C) tablet 500 mg, 500 mg, Oral, Daily, Judd Gaudier V, MD, 500 mg at 04/18/20 1045 .  chlorpheniramine-HYDROcodone (TUSSIONEX) 10-[name]/5ML suspension 5 mL, 5 mL, Oral, Q12H PRN, Athena Masse, MD, 5 mL at 04/18/20 1418 .  enoxaparin (LOVENOX) injection 40 mg, 40 mg, Subcutaneous, Q24H, Judd Gaudier V, MD, 40 mg at 04/18/20 1044 .  guaiFENesin-dextromethorphan (ROBITUSSIN DM) 100-10 MG/5ML syrup 10 mL, 10 mL, Oral, Q4H PRN, Athena Masse, MD, 10 mL at 04/18/20 1939 .  loratadine (CLARITIN) tablet 10 mg, 10 mg, Oral, Daily, Manuella Ghazi, Vipul, MD .  montelukast (SINGULAIR) tablet 10 mg, 10 mg, Oral, QPM, Manuella Ghazi, Vipul, MD, 10 mg at 04/18/20 1939 .  ondansetron (ZOFRAN) tablet 4 mg, 4 mg, Oral, Q6H PRN, 4 mg at 04/18/20 0051 **OR** ondansetron (ZOFRAN) injection 4 mg, 4 mg, Intravenous, Q6H PRN, Athena Masse, MD .  pantoprazole (PROTONIX) EC tablet 40 mg, 40 mg, Oral, Daily, Max Sane, MD, 40 mg at 04/18/20 1815 .  Tbo-Filgrastim (GRANIX) injection 480 mcg, 480 mcg, Subcutaneous, q1800, Lu Duffel, RPH, 480 mcg at 04/18/20 1815 .  zinc sulfate capsule 220 mg, 220 mg, Oral, Daily, Judd Gaudier V, MD, 220 mg at 04/18/20 1045  Facility-Administered Medications Ordered in Other Encounters:  .  sodium chloride flush (NS) 0.9 % injection 10 mL, 10 mL, Intravenous, PRN, [name], MD, 10 mL at 12/28/19 0815    ALLERGIES   Chlorhexidine     REVIEW OF SYSTEMS    Review of Systems:  Gen:  Denies  fever, sweats, chills weigh loss  HEENT: Denies blurred vision, double vision, ear pain, eye pain, hearing loss, nose bleeds, sore throat Cardiac:  No dizziness, chest pain or heaviness, chest tightness,edema Resp:   Denies cough or sputum porduction, shortness of breath,wheezing, hemoptysis,  Gi: Denies swallowing difficulty, stomach pain, nausea or vomiting, diarrhea, constipation, bowel incontinence Gu:  Denies bladder incontinence, burning urine Ext:   Denies Joint pain, stiffness or swelling Skin: Denies  skin rash, easy bruising or bleeding or hives Endoc:  Denies polyuria, polydipsia , polyphagia or weight change Psych:   Denies depression, insomnia or hallucinations   Other:  All other systems negative   VS: BP (!) 117/49 (BP Location: Left Arm)   Pulse 68   Temp 98.3 F (36.8 C)   Resp 16   Ht 5' (1.524 m)   Wt 80.6 kg   LMP 01/18/2005   SpO2 95%   BMI 34.70 kg/m      PHYSICAL EXAM     GENERAL:NAD, no fevers, chills, no weakness no fatigue HEAD: Normocephalic, atraumatic.  EYES: Pupils equal, round, reactive to light. Extraocular muscles intact. No scleral icterus.  MOUTH: Moist mucosal membrane. Dentition intact. No abscess noted.  EAR, NOSE, THROAT: Clear without exudates. No external lesions.  NECK: Supple. No thyromegaly. No nodules. No JVD.  PULMONARY: mild rhonchi bilaterally  CARDIOVASCULAR: S1 and S2. Regular rate and rhythm. No murmurs, rubs, or gallops. No edema. Pedal pulses 2+ bilaterally.  GASTROINTESTINAL: Soft, nontender, nondistended. No masses. Positive bowel sounds. No hepatosplenomegaly.  MUSCULOSKELETAL: No swelling, clubbing, or edema. Range of motion full in all extremities.  NEUROLOGIC: Cranial nerves II through XII are intact. No gross focal neurological deficits. Sensation intact. Reflexes  intact.  SKIN: No ulceration, lesions, rashes, or cyanosis. Skin warm and dry. Turgor intact.  PSYCHIATRIC: Mood, affect within normal limits. The patient is awake, alert and oriented x 3. Insight, judgment intact.       IMAGING    DG Chest 2 View  Result Date: 04/17/2020 CLINICAL DATA:  Shortness of breath, history of prior COVID-19 positivity EXAM: CHEST - 2 VIEW COMPARISON:  04/13/2020 FINDINGS: Cardiac shadow is stable. Left-sided chest wall port is again seen. Patchy bibasilar airspace opacities are again identified but slightly increased when compare with the prior exam. No sizable effusion is noted. No bony abnormality is seen. IMPRESSION: Increase in bibasilar airspace opacities when compared with the prior exam. This likely represents sequelae from the prior COVID-19 infection. Electronically Signed   By: Inez Catalina M.D.   On: 04/17/2020 19:26   DG Chest 2 View  Result Date: 04/13/2020 CLINICAL DATA:  Acute cough EXAM: CHEST - 2 VIEW COMPARISON:  Prior chest x-ray 03/28/2020 FINDINGS: Left subclavian approach single-lumen power injectable port  catheter. Port catheter tip at the cavoatrial junction. Cardiac and mediastinal contours remain unchanged. Interval development of patchy airspace opacities in the lower lobes and lingula. No evidence of pleural effusion. No pneumothorax. No acute osseous abnormality. Surgical clips again noted in the right upper quadrant. IMPRESSION: 1. Interval development of patchy airspace opacities in both lower lobes and in the lingula. Findings are concerning for multifocal pneumonia. Given patient's history of lymphoma, pulmonary lymphoma is another but significantly less likely consideration. Electronically Signed   By: Jacqulynn Cadet M.D.   On: 04/13/2020 14:59   DG Chest 2 View  Result Date: 03/28/2020 CLINICAL DATA:  Cough, fever. EXAM: CHEST - 2 VIEW COMPARISON:  July 08, 2019. FINDINGS: The heart size and mediastinal contours are within normal limits. Both lungs are clear. Left subclavian Port-A-Cath is noted with tip in expected position of the SVC. The visualized skeletal structures are unremarkable. IMPRESSION: No active cardiopulmonary disease. Electronically Signed   By: Marijo Conception M.D.   On: 03/28/2020 20:13   CT Angio Chest PE W and/or Wo Contrast  Result Date: 04/18/2020 CLINICAL DATA:  Shortness of breath, COVID positive EXAM: CT ANGIOGRAPHY CHEST WITH CONTRAST TECHNIQUE: Multidetector CT imaging of the chest was performed using the standard protocol during bolus administration of intravenous contrast. Multiplanar CT image reconstructions and MIPs were obtained to evaluate the vascular anatomy. CONTRAST:  10mL OMNIPAQUE IOHEXOL 350 MG/ML SOLN COMPARISON:  None. FINDINGS: Cardiovascular: There is a optimal opacification of the pulmonary arteries. There is no central,segmental, or subsegmental filling defects within the pulmonary arteries. The heart is normal in size. No pericardial effusion or thickening. No evidence right heart strain. There is normal three-vessel brachiocephalic anatomy  without proximal stenosis. The thoracic aorta is normal in appearance. Mediastinum/Nodes: No hilar, mediastinal, or axillary adenopathy. Thyroid gland, trachea, and esophagus demonstrate no significant findings. A left-sided MediPort catheter seen with the tip at the superior cavoatrial junction. Lungs/Pleura: Hazy patchy airspace opacities are seen throughout both lungs, right greater than left. This is also most notable at both lung apices. No pleural effusion or pneumothorax. Upper Abdomen: No acute abnormalities present in the visualized portions of the upper abdomen. Musculoskeletal: No chest wall abnormality. No acute or significant osseous findings. Review of the MIP images confirms the above findings. IMPRESSION: No central, segmental, or subsegmental pulmonary embolism Multifocal patchy airspace opacities throughout both lungs, consistent with COVID pneumonia Electronically Signed   By: Ebony Cargo.D.  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 as per CT chest - would offer colchicine at this point for 30d - there is overall paucity of data to treat this entity however some literature suggests this may be an option as steroid sparing regimen  - she would benefit from PT/OT and remainder of therapy likely outpatient  - would order respiratory cultures - noted patient recently finished burst of antibiotics  -patient is currently on room air only with sPO2 >95%  - chest physiotherapy to prevent atelectatic formation - incentive spirometry    Follicular lymphoma    - treatment per oncology    - leukopenia - on granix   Thank you for allowing me to participate in the care of this patient.   Patient/Family are satisfied with care plan and all questions have been answered.  This document was prepared using Dragon voice recognition software and may include unintentional dictation errors.     Ottie Glazier, M.D.  Division of [place]

## 2020-04-19 NOTE — Progress Notes (Addendum)
1        [facility] [place] NAME: Kim Contreras    MR#:  [id number]  DATE OF BIRTH:  March 25, 1961  SUBJECTIVE:  CHIEF COMPLAINT:   Chief Complaint  Patient presents with  . Shortness of Breath  not feeling too well today, c/o nausea. REVIEW OF SYSTEMS:  Review of Systems  Constitutional: Positive for malaise/fatigue. Negative for diaphoresis, fever and weight loss.  HENT: Negative for ear discharge, ear pain, hearing loss, nosebleeds, sore throat and tinnitus.   Eyes: Negative for blurred vision and pain.  Respiratory: Negative for cough, hemoptysis and wheezing.   Cardiovascular: Negative for chest pain, palpitations, orthopnea and leg swelling.  Gastrointestinal: Positive for nausea. Negative for abdominal pain, blood in stool, constipation, diarrhea, heartburn and vomiting.  Genitourinary: Negative for dysuria, frequency and urgency.  Musculoskeletal: Negative for back pain and myalgias.  Skin: Negative for itching and rash.  Neurological: Negative for dizziness, tingling, tremors, focal weakness, seizures, weakness and headaches.  Psychiatric/Behavioral: Negative for depression. The patient is not nervous/anxious.    DRUG ALLERGIES:   Allergies  Allergen Reactions  . Chlorhexidine     Not required   VITALS:  Blood pressure 118/73, pulse 74, temperature 98.2 F (36.8 C), resp. rate 16, height 5' (1.524 m), weight 80.6 kg, last menstrual period 01/18/2005, SpO2 97 %. PHYSICAL EXAMINATION:  Physical Exam HENT:     Head: Normocephalic and atraumatic.  Eyes:     Conjunctiva/sclera: Conjunctivae normal.     Pupils: Pupils are equal, round, and reactive to light.  Neck:     Thyroid: No thyromegaly.     Trachea: No tracheal deviation.  Cardiovascular:     Rate and Rhythm: Normal rate and regular rhythm.     Heart sounds: Normal heart sounds.  Pulmonary:     Effort: Pulmonary effort is normal. No respiratory distress.     Breath sounds: Normal  breath sounds. No wheezing.  Chest:     Chest wall: No tenderness.  Abdominal:     General: Bowel sounds are normal. There is no distension.     Palpations: Abdomen is soft.     Tenderness: There is no abdominal tenderness.  Musculoskeletal:        General: Normal range of motion.     Cervical back: Normal range of motion and neck supple.  Skin:    General: Skin is warm and dry.     Findings: No rash.  Neurological:     Mental Status: She is alert and oriented to person, place, and time.     Cranial Nerves: No cranial nerve deficit.    LABORATORY PANEL:  Female CBC Recent Labs  Lab 04/19/20 0449  WBC 7.2  HGB 11.4*  HCT 33.2*  PLT 146*   ------------------------------------------------------------------------------------------------------------------ Chemistries  Recent Labs  Lab 04/19/20 0449  NA 140  K 3.5  CL 107  CO2 26  GLUCOSE 104*  BUN 8  CREATININE 0.64  CALCIUM 8.2*  AST 25  ALT 22  ALKPHOS 71  BILITOT 0.5   RADIOLOGY:  No results found. ASSESSMENT AND PLAN:  60 year old female with a known history of follicular lymphoma, hypertension who was diagnosed with COVID-19 on 12/29 and also treated with Levaquin for possible superimposed bacterial pneumonia from 1/14-1/18 was sent over by her PCP due to worsening cough and shortness of breath.  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 was trending down from before (1300<-1500<-2200) 1 more day of  Granix per oncology.  Discussed with Dr. Rogue Bussing via secure chat Likely due to recent COVID 19 infection with underlying lymphoma and chemotherapy  Hypokalemia Repleted and resolved  COVID-19 infection I will stop remdesivir at this point as I am uncertain of its therapeutic benefit this far out Hold off steroids because she is not hypoxic Her procalcitonin is normal so I will hold off any antibiotic.  She already had received a course of Levaquin as an outpatient appreciate pulmonary  input She does not have much significant symptoms from this  Grade 3A follicular lymphoma Followed by Dr. Rogue Bussing at cancer center.  He is aware  Hypertension Stable  Nausea Symptomatic mgmt, could be from meds side effect. No vomiting reported  Body mass index is 34.7 kg/m.      Status is: Inpatient  Remains inpatient appropriate because:Inpatient level of care appropriate due to severity of illness   Dispo: The patient is from: Home              Anticipated d/c is to: Home              Anticipated d/c date is: 1 day              Patient currently is not medically stable to d/c.  Severe leukopenia requiring inpt treatment per onco    DVT prophylaxis:       enoxaparin (LOVENOX) injection 40 mg Start: 04/18/20 1000     Family Communication: Updated patient's daughter [name] over phone at [phone] on 1/19   All the records are reviewed and case discussed with Care Management/Social Worker. Management plans discussed with the patient, family and they are in agreement.  CODE STATUS: Full Code  TOTAL TIME TAKING CARE OF THIS PATIENT: 35 minutes.   More than 50% of the time was spent in counseling/coordination of care: YES  POSSIBLE D/C IN 1-2 DAYS, DEPENDING ON CLINICAL CONDITION.   Max Sane M.D on 04/19/2020 at 2:53 PM  [facility]   CC: Primary care physician; Center, [place]  Note: This dictation was prepared with Diplomatic Services operational officer dictation along with smaller phrase technology. Any transcriptional errors that result from this process are unintentional.

## 2020-04-19 NOTE — Progress Notes (Signed)
Nightly update given to daughter [name]

## 2020-04-20 ENCOUNTER — Other Ambulatory Visit: Payer: Self-pay | Admitting: *Deleted

## 2020-04-20 DIAGNOSIS — C8238 Follicular lymphoma grade IIIa, lymph nodes of multiple sites: Secondary | ICD-10-CM

## 2020-04-20 LAB — CBC WITH DIFFERENTIAL/PLATELET
Abs Immature Granulocytes: 2.55 10*3/uL — ABNORMAL HIGH (ref 0.00–0.07)
Basophils Absolute: 0.1 10*3/uL (ref 0.0–0.1)
Basophils Relative: 0 %
Eosinophils Absolute: 0 10*3/uL (ref 0.0–0.5)
Eosinophils Relative: 0 %
HCT: 34.2 % — ABNORMAL LOW (ref 36.0–46.0)
Hemoglobin: 12.2 g/dL (ref 12.0–15.0)
Immature Granulocytes: 16 %
Lymphocytes Relative: 3 %
Lymphs Abs: 0.4 10*3/uL — ABNORMAL LOW (ref 0.7–4.0)
MCH: 34.4 pg — ABNORMAL HIGH (ref 26.0–34.0)
MCHC: 35.7 g/dL (ref 30.0–36.0)
MCV: 96.3 fL (ref 80.0–100.0)
Monocytes Absolute: 0.7 10*3/uL (ref 0.1–1.0)
Monocytes Relative: 4 %
Neutro Abs: 11.9 10*3/uL — ABNORMAL HIGH (ref 1.7–7.7)
Neutrophils Relative %: 77 %
Platelets: 164 10*3/uL (ref 150–400)
RBC: 3.55 MIL/uL — ABNORMAL LOW (ref 3.87–5.11)
RDW: 13 % (ref 11.5–15.5)
Smear Review: NORMAL
WBC Morphology: INCREASED
WBC: 15.6 10*3/uL — ABNORMAL HIGH (ref 4.0–10.5)
nRBC: 0 % (ref 0.0–0.2)

## 2020-04-20 LAB — COMPREHENSIVE METABOLIC PANEL
ALT: 19 U/L (ref 0–44)
AST: 21 U/L (ref 15–41)
Albumin: 2.9 g/dL — ABNORMAL LOW (ref 3.5–5.0)
Alkaline Phosphatase: 82 U/L (ref 38–126)
Anion gap: 10 (ref 5–15)
BUN: 11 mg/dL (ref 6–20)
CO2: 25 mmol/L (ref 22–32)
Calcium: 8.6 mg/dL — ABNORMAL LOW (ref 8.9–10.3)
Chloride: 105 mmol/L (ref 98–111)
Creatinine, Ser: 0.66 mg/dL (ref 0.44–1.00)
GFR, Estimated: >60 mL/min (ref >60)
Glucose, Bld: 98 mg/dL (ref 70–99)
Potassium: 3.3 mmol/L — ABNORMAL LOW (ref 3.5–5.1)
Sodium: 140 mmol/L (ref 135–145)
Total Bilirubin: 0.4 mg/dL (ref 0.3–1.2)
Total Protein: 6.1 g/dL — ABNORMAL LOW (ref 6.5–8.1)

## 2020-04-20 LAB — FIBRIN DERIVATIVES D-DIMER (ARMC ONLY): Fibrin derivatives D-dimer (ARMC): 1526.68 ng/mL (FEU) — ABNORMAL HIGH (ref 0.00–499.00)

## 2020-04-20 LAB — C-REACTIVE PROTEIN: CRP: 1.6 mg/dL — ABNORMAL HIGH (ref <1.0)

## 2020-04-20 MED ORDER — POTASSIUM CHLORIDE CRYS ER 20 MEQ PO TBCR
40.0000 meq | EXTENDED_RELEASE_TABLET | Freq: Once | ORAL | Status: AC
  Administered 2020-04-20: 10:00:00 40 meq via ORAL
  Filled 2020-04-20: qty 2

## 2020-04-20 NOTE — Discharge Instructions (Signed)
[email or web address]">  Leucopenia Leukopenia La leucopenia es una afeccin caracterizada por la baja cantidad de glbulos blancos. Los glbulos blancos ayudan al organismo a combatir las infecciones. Hay cinco tipos de glbulos blancos. La mayora de los glbulos blancos est compuesta por dos tipos de glbulos, llamados linfocitos yneutrfilos.  El nivel bajo de linfocitos se denomina "linfocitopenia".  El nivel bajo de neutrfilos se conoce como "neutropenia". La neutropenia es el tipo ms peligroso de leucopenia, porque puede causar infecciones peligrosas. La prevencin de infecciones es importante si usted tiene leucopenia. Cules son las causas? Con frecuencia, esta afeccin es causada por daos en el tejido blando dentro de los huesos (mdula sea), que es donde se producen la mayora de los glbulos blancos. La mdula sea puede daarse a raz de lo siguiente:  Tratamientos con rayos X o medicamentos para el cncer (quimioterapia o radioterapia).  Infecciones graves.  Cncer de los glbulos blancos.  Medicamentos, entre ellos: ? Ciertos antibiticos. ? Determinados medicamentos para el corazn. ? Corticoesteroides. ? Determinados medicamentos utilizados para tratar enfermedades del sistema inmunitario (enfermedades autoinmunitarias), como la artritis reumatoidea. La leucopenia tambin se produce cuando los glbulos blancos se destruyen despus de salir de la mdula sea, lo cual puede ocurrir por las siguientes causas:  Enfermedad heptica.  Enfermedad autoinmunitaria.  Falta de vitamina B. La cantidad de glbulos blancos del cuerpo vara de Kim Contreras persona a la otra. Cules son los signos o sntomas? En algunos casos, no hay sntomas. Cuando s se presentan sntomas, uno de los signos ms frecuentes de la leucopenia, en especial de la neutropenia grave, es la presencia de muchas infecciones  bacterianas. Las diferentes infecciones tienen sntomas diferentes. Una infeccin en los pulmones puede causar tos. Una infeccin de las vas urinarias puede causar necesidad frecuente de Kim Contreras y sensacin de ardor. Tambin pueden aparecer infecciones en la sangre, la piel, el recto, la garganta, los senos paranasales o los odos. Entre otros sntomas de esta afeccin, se incluyen los siguientes:  Kim Contreras, transpiracin y Kim Contreras.  Fatiga.  Ganglios hinchados (ganglios linfticos).  Llagas dolorosas en la boca.  Enfermedad de las encas. Cmo se diagnostica? Esta afeccin se puede diagnosticar en funcin de los antecedentes mdicos, un examen fsico y estudios. En el examen fsico, el mdico lo revisar para ver si tiene los ganglios linfticos hinchados y si el bazo est agrandado. El bazo es un rgano que se [place] en el costado izquierdo del cuerpo y que almacena los glbulos blancos. Tambin pueden hacerle estudios, por ejemplo:  Hemograma completo. En este anlisis de Kim Contreras, se hace un recuento de cada tipo de glbulo blanco.  Aspiracin de mdula sea. Se extrae una parte de mdula sea para examinar con el microscopio.  Biopsia de ganglios linfticos. Se extrae una parte de tejido de los ganglios linfticos para examinar con el microscopio.  Otros tipos de anlisis de sangre o estudios de diagnstico por imgenes. Cmo se trata? El tratamiento de la leucopenia depende de la causa. Algunos de los tratamientos frecuentes incluyen:  Tomar antibiticos para tratar las infecciones bacterianas.  Suspender los medicamentos que toma que podran estar causando leucopenia.  Recibir medicamentos que estimulan la produccin de neutrfilos (factores de crecimiento hematopoytico) para tratar la neutropenia. Siga estas instrucciones en su casa: Prevencin de infecciones  Evite el contacto cercano con personas que estn enfermas.  Lvese las manos frecuentemente con agua y [place].  Use desinfectante para manos si no dispone de [place] y [place].  No coma carnes crudas o mal cocidas.  Kim Contreras  las frutas y verduras antes de comerlas.  No coma ni beba productos lcteos no pasteurizados.  Reciba atencin dental de forma regular y Furniture conservator/restorer buena higiene dental. Debe visitar al dentista al menos una vez cada 6 meses.  Evite las flores o plantas frescas, que pueden contener bacterias en el suelo. Instrucciones generales  Use los medicamentos de venta libre y los recetados solamente como se lo haya indicado el mdico. Esto [name] y los suplementos.  Si le recetaron un antibitico, tmelo como se lo haya indicado el mdico. No deje de tomar el antibitico aunque comience a sentirse mejor.  Evite los aglomerados de gente. Si necesita ir a Engineer, civil (consulting) pblico donde se congregan Regulatory affairs officer, trate de ir en el horario en que probablemente est menos concurrido.  Concurra a todas las visitas de seguimiento como se lo haya indicado el mdico. Esto es importante. Comunquese con un mdico si tiene:  Una temperatura superior a 101 F (38.3 C), sudoracin o escalofros.  Ganglios linfticos hinchados.  Fatiga.  Llagas dolorosas en [place] encas.  Kim Contreras, nariz tapada (congestin nasal) o dolor de garganta.  Dolor o ardor al Kim Contreras, necesidad intensa de Kim Contreras o necesidad de Kim Contreras con mayor frecuencia. Solicite ayuda inmediatamente si tiene:  Una temperatura superior a 101 F (38.3 C) que dura ms de 2 das.  Sntomas como tos, congestin nasal, dolor de garganta o problemas urinarios que duran ms de 2 das.  Dificultad para respirar.  Dolor de pecho. Estos sntomas pueden representar un problema grave que constituye Engineer, maintenance (IT). No espere a ver si los sntomas desaparecen. Solicite atencin mdica de inmediato. Comunquese con el servicio de emergencias de su localidad (911 en los [place]). No conduzca por sus propios medios  [employer] hospital. Resumen  La leucopenia es una afeccin caracterizada por la baja cantidad de glbulos blancos. Los glbulos blancos ayudan al organismo a combatir las infecciones.  El tratamiento de la leucopenia depende de la causa.  La prevencin de infecciones es importante si usted tiene leucopenia. Siga las instrucciones para prevenir infecciones.  Solicite ayuda de inmediato si tiene una temperatura superior a 101 F (38.3 C) que dura ms de 2 das. Esta informacin no tiene Marine scientist el consejo del mdico. Asegrese de hacerle al mdico cualquier pregunta que tenga. Document Revised: 04/21/2019 Document Reviewed: 04/21/2019 Elsevier Patient Education  2021 [employer] COVID-19: [place] Hero cuarentena y aislamiento COVID-19 Quarantine vs. Isolation La CUARENTENA obliga a la persona que ha estado en contacto estrecho con alguien que tiene [id number] a mantenerse alejada de los dems. Haga cuarentena si ha estado en contacto estrecho con alguien que tiene COVID-19, a menos que tenga la vacunacin [place]. Si tiene la vacunacin completa  NO necesita hacer cuarentena a menos que tenga sntomas  Hgase la prueba entre 3 y 5 das despus de su exposicin, incluso si no tiene sntomas  Use Geographical information systems officer en espacios pblicos interiores durante 14 das despus de la exposicin o hasta que el resultado de la prueba sea negativo Si no tiene la vacunacin Energy manager en casa durante 14 das despus del ltimo contacto con una persona que tiene COVID-19  Preste atencin a si tiene fiebre (100.98F), tos, dificultad para respirar u otros sntomas de COVID-19  Si es posible, mantngase lejos de las personas con las que vive, sobre todo si tienen un riesgo mayor de enfermarse gravemente de COVID-19  Pngase en contacto con el departamento local  de salud pblica para Civil engineer, contracting las opciones en su rea para posiblemente acortar su cuarentena El AISLAMIENTO obliga a una  persona enferma o que obtuvo resultado positivo de la prueba de COVID-19, aunque no presente sntomas, a Product manager de Producer, television/film/video, incluso en su hogar. Las personas que estn aisladas deben quedarse en casa y Public affairs consultant en una "habitacin de enfermo" o zona especfica y usar un bao separado (si est disponible). Si est enfermo y piensa o sabe que tiene COVID-19 [employer] en su casa hasta despus  De que hayan pasado al menos 10 das desde que tuvo sntomas por primera vez y  [place] haya pasado al menos 24 horas sin fiebre sin tomar medicamentos para Engineer, materials fiebre y  [place] que los sntomas hayan mejorado Si obtuvo resultado positivo en la prueba de COVID-19 pero no presenta sntomas  Qudese en casa hasta que hayan pasado 10 das desde que obtuvo resultado positivo en la prueba viral  Si presenta sntomas despus de un resultado positivo, siga los pasos anteriores para las personas enfermas Kim Contreras 12/01/2018 Esta informacin no tiene Marine scientist el consejo del mdico. Asegrese de hacerle al mdico cualquier pregunta que tenga. Document Revised: 01/30/2020 Document Reviewed: 01/30/2020 Elsevier Patient Education  2021 [place] usar y Probation officer How to Leisure centre manager and Take Off Your Mask Cmo ponerse y Forensic psychologist de [place] o use un desinfectante para manos antes de ponerse la mascarilla  Pngasela sobre la nariz y la boca  Asegrese de que la mascarilla se ajuste perfectamente a los lados de la cara y debajo de la barbilla  Asegrese de poder respirar Theatre manager para protegerse a usted mismo y a los dems  Use la mascarilla sobre la nariz y la boca para Technical sales engineer y Education officer, museum en entornos pblicos, especialmente cuando no pueda estar a seis pies (45metros) de Writer de Engineer, manufacturing que no vivan con usted Cmo quitarse la mascarilla  Desate las tiras  detrs de su cabeza o estire los lazos de las Materials engineer solo por los lazos de las orejas o las tiras  Pliegue las esquinas externas juntas  Lvese las manos inmediatamente despus de quitrsela Otras formas de protegerse  Mantenga una distancia de al menos 6 pies (2 metros) de Music therapist las multitudes y los lugares con poca ventilacin  Lvese las manos con frecuencia  Colquese una vacuna cuando se le ofrezca Kim Contreras 07/16/2018 Esta informacin no tiene como fin reemplazar el consejo del mdico. Asegrese de hacerle al mdico cualquier pregunta que tenga. Document Revised: 01/30/2020 Document Reviewed: 01/30/2020 Elsevier Patient Education  2021 [place] de decbito prono Prone Position Therapy  La terapia de decbito prono, tambin llamada "posicin prona", es cuando una persona es colocada boca abajo para facilitarle la respiracin. Esta terapia habitualmente se administra en la unidad de cuidados intensivos (UCI) de un hospital. Benito Mccreedy de las personas estn dormidas durante la [place], [place], en ocasiones, algunas estn despiertas. Durante esta terapia, la persona est boca abajo durante el mayor tiempo posible. Esto puede ser 16horas o ms por da. A menudo, esta terapia se utiliza cuando la persona:  Tiene sndrome de dificultad respiratoria aguda (SDRA). Esta afeccin es muy grave debido a que se acumula lquido [employer]. Cuando esto ocurre, los pulmones no pueden llenarse de aire de la [place] normal y el oxgeno no Conservator, museum/gallery a  la sangre.  Requiere un respirador que la ayude a Ambulance person. El respirador es un aparato que lleva aire [place] dentro y [place] fuera de los pulmones. La terapia de decbito prono puede facilitar el pasaje de oxgeno de los pulmones a Herbalist. Tambin puede [employer] se daen por estar conectados a Animal nutritionist. Cules son los riesgos? Los riesgos pueden  incluir:  Desconexin u obstruccin de los tubos. Por ejemplo, los tubos para respirar, los drenajes o las vas intravenosas (i.v.).  Lesiones por presin en la piel y el tejido. Esto ocurre cuando la piel es presionada contra una superficie, como un colchn, durante [place].  Lesiones nerviosas por estar en una misma posicin durante [employer].  Acumulacin de lquido en el rostro.  Inhalacin de alimentos, saliva o lquidos hacia los pulmones (aspiracin).  Falta de respuesta a esta terapia y que la afeccin empeore en lugar de Teacher, English as a foreign language. Qu sucede antes del tratamiento? El mdico se Charity fundraiser de que sea seguro realizar la terapia. Para esto, el mdico puede controlar lo siguiente:  La presin arterial, frecuencia cardaca y [place]om[place], y su nivel de oxgeno en la sangre.  La piel. Le aplicarn una barrera protectora en la cara y en cualquier zona que pueda recibir mucha presin Optician, dispensing.  Su nivel de Hydrographic surveyor y Social research officer, government.  Los tubos que tenga conectados a usted. Todas las vas, tubos y drenajes se fijarn. Es posible que le den medicamentos para:  Ayudarlo a Nurse, children's (sedantes).  Controlar la presin arterial.  Financial controller. Qu ocurre durante el tratamiento?  Los mdicos, o una cama especial con movimiento, lo levantarn y lo pondrn boca abajo.  Se usar una ropa de cama especial para evitar las lesiones por presin. Esta puede incluir una [place], una almohadilla, un colchn o un almohadn que est relleno con gel, aire, agua o espuma.  Le movern la cabeza, los brazos y las piernas hacia diferentes posiciones una vez por hora.  Ajustarn los dispositivos mdicos y ortopdicos segn sea necesario para Public house manager la presin sobre la piel. Los mdicos tambin pueden colocar almohadillas entre la piel y el dispositivo.  Los mdicos controlarn lo siguiente peridicamente: ? La piel, para detectar hinchazn y signos de lesiones por presin. ? Los  tubos de respiracin, drenajes o vas intravenosas (i.v.) para asegurarse de que an estn en su lugar y no estn obstruidos. ? Rexanne Mano, para medir el nivel de oxgeno y dixido de carbono (prueba de gases en sangre arterial). Esta prueba le indica al mdico si la terapia est ayudando. ? La presin arterial, frecuencia cardaca y respiratoria, y su nivel de oxgeno en la sangre. ? El Publishing copy, para cerciorarse de que todas las conexiones estn bien sujetas y de que la mquina est funcionando correctamente. Los respiradores tienen alarmas que suenan si hay que revisar algo. El procedimiento puede variar segn el mdico y el hospital. Sander Nephew podra pasar despus del tratamiento?  Lo volvern a colocar boca arriba.  Le realizarn ms pruebas de gases en sangre arterial para asegurarse de que el tratamiento haya sido de [place].  Le controlarn la presin arterial, la frecuencia cardaca y Personal assistant, y el nivel de oxgeno en la sangre. Resumen  La terapia de decbito prono, tambin llamada "posicin prona", es cuando una persona es colocada boca abajo para facilitarle la respiracin.  La terapia de decbito prono puede facilitar el pasaje de oxgeno de los pulmones a Herbalist. Tambin puede [employer] se daen  debido al respirador.  Los mdicos, o una cama especial con movimiento, lo levantarn y lo pondrn boca abajo. Esta informacin no tiene Marine scientist el consejo del mdico. Asegrese de hacerle al mdico cualquier pregunta que tenga. Document Revised: 02/23/2019 Document Reviewed: 02/23/2019 Elsevier Patient Education  2021 [employer].

## 2020-04-20 NOTE — Progress Notes (Signed)
Patient is resting in bed. Has been up ambulating to bathroom without oxygen. She has no pain or distress. Gave discharge instructions and removed IV to left AC. She has no concerns. Did mention if she could have doctor's note, messaged doctor and obtained note. She has contacted daughter, awaiting arrival. Bed in low position and call bell in reach.

## 2020-04-21 NOTE — Discharge Summary (Signed)
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 Anemia 2006  . Anxiety   . Arthritis   . Helicobacter pylori gastritis   . History of hiatal hernia   . Hypertension   . Lymphoma [facility])    HOSPITAL COURSE:  60 year old female with a known history of follicular lymphoma, hypertension who was diagnosed with COVID-19 on 12/29 and also treated with Levaquin for possible superimposed bacterial pneumonia from 1/14-1/18 was sent over by her PCP due to worsening cough and shortness of breath.  Acute on chronic leukopenia WBC 1.0->7.2->15.6 s/p granix 2 dose. was trending down from before (1300<-1500<-2200) Likely due to recent COVID 19 infection with underlying lymphoma and chemotherapy  Hypokalemia Repleted and resolved  COVID-19 infection No therapeutic benefit of remdesivir at this point/this far out - diagnosed with COVID on 12/29  No need of steroids because she is not hypoxic Her procalcitonin is normal so no need of antibiotics. She already had received a course of Levaquin as an outpatient She does not have much significant symptoms from this  Grade 3A follicular lymphoma outpt f/up with Dr. Rogue Bussing at cancer center.  Hypertension controlled  Nausea Transient and resolved. No vomiting. Likely side-effect of medicine. Tolerating diet.   DISCHARGE CONDITIONS:  stable CONSULTS  OBTAINED:  Treatment Team:  Ottie Glazier, MD DRUG ALLERGIES:   Allergies  Allergen Reactions  . Chlorhexidine     Not required   DISCHARGE MEDICATIONS:   Allergies as of 04/20/2020      Reactions   Chlorhexidine    Not required      Medication List    TAKE these medications   albuterol 108 (90 Base) MCG/ACT inhaler Commonly known as: VENTOLIN HFA Inhale 2 puffs into the lungs every 4 (four) hours as needed for wheezing or shortness of breath.   brompheniramine-pseudoephedrine-DM 30-2-10 MG/5ML syrup Take 10 mLs by mouth 4 (four) times daily as needed.   levocetirizine 5 MG tablet Commonly known as: XYZAL Take 5 mg by mouth every evening.   losartan 50 MG tablet Commonly known as: COZAAR Take 50 mg by mouth daily.   meloxicam 7.5 MG tablet Commonly known as: MOBIC TAKE 1 TABLET(7.5 MG) BY MOUTH DAILY   mirabegron ER 25 MG Tb24 tablet Commonly known as: Myrbetriq Take 1 tablet (25 mg total) by mouth daily.   montelukast 10 MG tablet Commonly known as: SINGULAIR Take 10 mg by mouth every evening.   pantoprazole 40 MG tablet Commonly known as: Protonix Take 1 tablet (40 mg total) by mouth daily.      DISCHARGE INSTRUCTIONS:   DIET:  Regular diet DISCHARGE CONDITION:  Stable ACTIVITY:  Activity as tolerated OXYGEN:  Home Oxygen: No.  Oxygen Delivery: room air DISCHARGE LOCATION:  home   If you experience worsening of your admission symptoms, develop shortness of breath, life threatening emergency, suicidal or homicidal thoughts you must seek medical attention  immediately by calling 911 or calling your MD immediately  if symptoms less severe.  You Must read complete instructions/literature along with all the possible adverse reactions/side effects for all the Medicines you take and that have been prescribed to you. Take any new Medicines after you have completely understood and accpet all the possible adverse reactions/side effects.   Please  note  You were cared for by a hospitalist during your hospital stay. If you have any questions about your discharge medications or the care you received while you were in the hospital after you are discharged, you can call the unit and asked to speak with the hospitalist on call if the hospitalist that took care of you is not available. Once you are discharged, your primary care physician will handle any further medical issues. Please note that NO REFILLS for any discharge medications will be authorized once you are discharged, as it is imperative that you return to your primary care physician (or establish a relationship with a primary care physician if you do not have one) for your aftercare needs so that they can reassess your need for medications and monitor your lab values.    On the day of Discharge:  VITAL SIGNS:  Blood pressure 108/71, pulse 77, temperature 98.1 F (36.7 C), temperature source Oral, resp. rate 18, height 5' (1.524 m), weight 80.6 kg, last menstrual period 01/18/2005, SpO2 96 %. PHYSICAL EXAMINATION:  GENERAL:  60 y.o.-year-old patient lying in the bed with no acute distress.  EYES: Pupils equal, round, reactive to light and accommodation. No scleral icterus. Extraocular muscles intact.  HEENT: Head atraumatic, normocephalic. Oropharynx and nasopharynx clear.  NECK:  Supple, no jugular venous distention. No thyroid enlargement, no tenderness.  LUNGS: Normal breath sounds bilaterally, no wheezing, rales,rhonchi or crepitation. No use of accessory muscles of respiration.  CARDIOVASCULAR: S1, S2 normal. No murmurs, rubs, or gallops.  ABDOMEN: Soft, non-tender, non-distended. Bowel sounds present. No organomegaly or mass.  EXTREMITIES: No pedal edema, cyanosis, or clubbing.  NEUROLOGIC: Cranial nerves II through XII are intact. Muscle strength 5/5 in all extremities. Sensation intact. Gait not checked.  PSYCHIATRIC: The patient is alert and oriented x 3.  SKIN: No obvious  rash, lesion, or ulcer.  DATA REVIEW:   CBC Recent Labs  Lab 04/20/20 0434  WBC 15.6*  HGB 12.2  HCT 34.2*  PLT 164    Chemistries  Recent Labs  Lab 04/20/20 0434  NA 140  K 3.3*  CL 105  CO2 25  GLUCOSE 98  BUN 11  CREATININE 0.66  CALCIUM 8.6*  AST 21  ALT 19  ALKPHOS 82  BILITOT 0.4     Outpatient follow-up  Follow-up [place]. Go on 04/24/2020.   Specialty: General Practice Why: 11:00am; if patient has any COVID symptoms please call where the clinic has a seperate entrance to enter Contact information: [address] [place] [address] [phone]        [name], MD. Schedule an appointment as soon as possible for a visit in 1 week.   Specialties: Internal Medicine, Oncology Why: office will call daughter for an appointment Contact information: [place] [address] [phone]               32 Day Unplanned Readmission Risk Score   Flowsheet Row ED to Hosp-Admission (Discharged) from 04/17/2020 in [place] (1C)  30 Day Unplanned Readmission Risk Score (%)  9.97 Filed at 04/20/2020 0801     This score is the patient's risk of an unplanned readmission within 30 days of being discharged (0 -100%). The score is based on dignosis, age, lab data, medications, orders, and past utilization.   Low:  0-14.9   Medium: 15-21.9   High: 22-29.9   Extreme: 30 and above         Management plans discussed with the patient, family and they are in agreement.  CODE STATUS: Prior   TOTAL TIME TAKING CARE OF THIS PATIENT: 45 minutes.    Max Sane M.D on 04/21/2020 at 4:48 PM  [facility]   CC: Primary care physician; [facility] [place]   Note: This dictation was prepared with Diplomatic Services operational officer dictation along with smaller phrase technology. Any transcriptional errors that result from this process are  unintentional.

## 2020-04-23 LAB — CULTURE, BLOOD (ROUTINE X 2)
Culture: NO GROWTH
Culture: NO GROWTH

## 2020-05-07 ENCOUNTER — Telehealth: Payer: Self-pay | Admitting: *Deleted

## 2020-05-07 MED ORDER — DIAZEPAM 5 MG PO TABS: ORAL_TABLET | ORAL | 0 refills | Status: DC

## 2020-05-07 NOTE — Telephone Encounter (Signed)
Faxed prescription to patient's pharmacy. I contacted the patient's daughter. She is aware that valium was sent to her mom's pharmacy. She will need to bring a driver to take patient to pet scan apt due to valium. Valium instructions reviewed with daughter.

## 2020-05-07 NOTE — Telephone Encounter (Signed)
Valium prescribed- GB

## 2020-05-07 NOTE — Telephone Encounter (Signed)
Kim Contreras called reporting that patient is scheduled for PET tomorrow and that she has claustrophobia and needs medicine sent to [facility] in [place] so that she can have the scan done. Please advise

## 2020-05-08 ENCOUNTER — Other Ambulatory Visit: Payer: Self-pay

## 2020-05-08 ENCOUNTER — Ambulatory Visit
Admission: RE | Admit: 2020-05-08 | Discharge: 2020-05-08 | Disposition: A | Discharge status: Home / Self Care | Payer: Commercial Managed Care - PPO | Source: Ambulatory Visit | Attending: Internal Medicine | Admitting: Internal Medicine

## 2020-05-08 DIAGNOSIS — R918 Other nonspecific abnormal finding of lung field: Secondary | ICD-10-CM | POA: Insufficient documentation

## 2020-05-08 DIAGNOSIS — C8238 Follicular lymphoma grade IIIa, lymph nodes of multiple sites: Secondary | ICD-10-CM | POA: Insufficient documentation

## 2020-05-08 LAB — GLUCOSE, CAPILLARY: Glucose-Capillary: 91 mg/dL (ref 70–99)

## 2020-05-08 MED ORDER — FLUDEOXYGLUCOSE F - 18 (FDG) INJECTION
9.2000 | Freq: Once | INTRAVENOUS | Status: AC | PRN
  Administered 2020-05-08: 7.4 via INTRAVENOUS

## 2020-05-10 ENCOUNTER — Inpatient Hospital Stay: Payer: Commercial Managed Care - PPO | Admitting: Internal Medicine

## 2020-05-10 ENCOUNTER — Inpatient Hospital Stay: Payer: Commercial Managed Care - PPO | Attending: Internal Medicine

## 2020-05-10 DIAGNOSIS — Z79899 Other long term (current) drug therapy: Secondary | ICD-10-CM | POA: Insufficient documentation

## 2020-05-10 DIAGNOSIS — C8238 Follicular lymphoma grade IIIa, lymph nodes of multiple sites: Secondary | ICD-10-CM

## 2020-05-10 DIAGNOSIS — R5383 Other fatigue: Secondary | ICD-10-CM | POA: Insufficient documentation

## 2020-05-10 DIAGNOSIS — Z9221 Personal history of antineoplastic chemotherapy: Secondary | ICD-10-CM | POA: Diagnosis not present

## 2020-05-10 DIAGNOSIS — T451X5A Adverse effect of antineoplastic and immunosuppressive drugs, initial encounter: Secondary | ICD-10-CM | POA: Insufficient documentation

## 2020-05-10 DIAGNOSIS — R5381 Other malaise: Secondary | ICD-10-CM | POA: Diagnosis not present

## 2020-05-10 DIAGNOSIS — Z8616 Personal history of COVID-19: Secondary | ICD-10-CM | POA: Diagnosis not present

## 2020-05-10 DIAGNOSIS — Z803 Family history of malignant neoplasm of breast: Secondary | ICD-10-CM | POA: Diagnosis not present

## 2020-05-10 DIAGNOSIS — I10 Essential (primary) hypertension: Secondary | ICD-10-CM | POA: Insufficient documentation

## 2020-05-10 DIAGNOSIS — M1990 Unspecified osteoarthritis, unspecified site: Secondary | ICD-10-CM | POA: Diagnosis not present

## 2020-05-10 DIAGNOSIS — D701 Agranulocytosis secondary to cancer chemotherapy: Secondary | ICD-10-CM | POA: Insufficient documentation

## 2020-05-10 LAB — CBC WITH DIFFERENTIAL/PLATELET
Abs Immature Granulocytes: 0.01 10*3/uL (ref 0.00–0.07)
Basophils Absolute: 0 10*3/uL (ref 0.0–0.1)
Basophils Relative: 1 %
Eosinophils Absolute: 0.1 10*3/uL (ref 0.0–0.5)
Eosinophils Relative: 4 %
HCT: 32.3 % — ABNORMAL LOW (ref 36.0–46.0)
Hemoglobin: 11.2 g/dL — ABNORMAL LOW (ref 12.0–15.0)
Immature Granulocytes: 1 %
Lymphocytes Relative: 25 %
Lymphs Abs: 0.6 10*3/uL — ABNORMAL LOW (ref 0.7–4.0)
MCH: 34 pg (ref 26.0–34.0)
MCHC: 34.7 g/dL (ref 30.0–36.0)
MCV: 98.2 fL (ref 80.0–100.0)
Monocytes Absolute: 0.4 10*3/uL (ref 0.1–1.0)
Monocytes Relative: 20 %
Neutro Abs: 1.1 10*3/uL — ABNORMAL LOW (ref 1.7–7.7)
Neutrophils Relative %: 49 %
Platelets: 294 10*3/uL (ref 150–400)
RBC: 3.29 MIL/uL — ABNORMAL LOW (ref 3.87–5.11)
RDW: 13.9 % (ref 11.5–15.5)
WBC: 2.2 10*3/uL — ABNORMAL LOW (ref 4.0–10.5)
nRBC: 0 % (ref 0.0–0.2)

## 2020-05-10 LAB — COMPREHENSIVE METABOLIC PANEL
ALT: 15 U/L (ref 0–44)
AST: 15 U/L (ref 15–41)
Albumin: 3.9 g/dL (ref 3.5–5.0)
Alkaline Phosphatase: 80 U/L (ref 38–126)
Anion gap: 9 (ref 5–15)
BUN: 14 mg/dL (ref 6–20)
CO2: 24 mmol/L (ref 22–32)
Calcium: 9 mg/dL (ref 8.9–10.3)
Chloride: 105 mmol/L (ref 98–111)
Creatinine, Ser: 0.57 mg/dL (ref 0.44–1.00)
GFR, Estimated: >60 mL/min (ref >60)
Glucose, Bld: 99 mg/dL (ref 70–99)
Potassium: 4.3 mmol/L (ref 3.5–5.1)
Sodium: 138 mmol/L (ref 135–145)
Total Bilirubin: 0.6 mg/dL (ref 0.3–1.2)
Total Protein: 7.2 g/dL (ref 6.5–8.1)

## 2020-05-10 LAB — C-REACTIVE PROTEIN: CRP: 1.1 mg/dL — ABNORMAL HIGH (ref <1.0)

## 2020-05-10 LAB — LACTATE DEHYDROGENASE: LDH: 127 U/L (ref 98–192)

## 2020-05-10 NOTE — Assessment & Plan Note (Addendum)
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 S/p Obi-benda x6 cylces-FEB 8th, 2022-   No findings suspicious for residual or recurrent lymphoma;  Regressing matted soft tissue density in the mesentery and retroperitoneum consistent with treated disease. Hypermetabolic pulmonary infiltrates consistent with COVID [see below]  #Again reviewed the prognosis with the patient and daughter.  Unfortunately follicular lymphoma cannot be cured.  However lymphomas could going to "remissions"-during which patient would not need any therapy.  However will need close surveillance.  #COVID pneumonia-status post remdesivir; February 2022-PET scan shows include lung infiltrates compared to January 2022.  Improving.  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  #IV access: discussed pros and cons of continued port placement; wants to have explanted. Will make referral to IR.   # DISPOSITION: # port explanation referral # Follow up in 3 months-- ;MD; labs- cbc/cmp;LDH; Dr.B  # I reviewed the blood work- with the patient in detail; also reviewed the imaging independently [as summarized above]; and with the patient in detail.

## 2020-05-10 NOTE — Progress Notes (Signed)
[place] NOTE  Patient Care Team: Center, [facility] as PCP - General ([place]) [name], MD as Consulting Physician (Hematology and Oncology)  CHIEF COMPLAINTS/PURPOSE OF CONSULTATION: Lymphoma   Oncology History Overview Note  # 5th FEB 2021- ABDOMINAL MASS-  9.3 x 3.0 cm central mesenteric soft tissue mass is identified on image 38/series 2. 3.0x 3.0 cm collar of soft tissue surrounds branches of the superior mesenteric artery and vein on image 47/2 with relatively little mass-effect on the vascular anatomy. 2.0 x 1.5 cm nodular component of this abnormal soft tissue is identified in the mesentery of the right pelvis on 55/2. FEB 2021- PET-  PET scan-February 2021-SUV around 5.5 again highly suggestive of malignancy lymphoma.  Small cluster left supra pelvic lymph node/small retroperitoneal lymph nodes-5 mm-Douville score 3.   # MARCH 2025- grade 3A follicular lymphoma [Open biopsy Dr. 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 ([place])   07/11/2019 -  Chemotherapy   The patient had dexamethasone (DECADRON) 4 MG tablet, 8 mg, Oral, Daily, 1 of 1 cycle, Start date: --, [name], 0.25 mg, Intravenous,  Once, 6 of 6 cycles Administration: 0.25 mg (07/11/2019), 0.25 mg (08/22/2019), 0.25 mg (09/22/2019), 0.25 mg (11/28/2019), 0.25 mg (01/03/2020) pegfilgrastim-cbqv (UDENYCA) injection 6 mg, 6 mg, Subcutaneous, Once, 3 of 3 cycles Administration: 6 mg (11/30/2019), 6 mg (01/05/2020), 6 mg (02/13/2020) bendamustine (BENDEKA) 150 mg in sodium chloride 0.9 % 50 mL (2.6786 mg/mL) chemo infusion, 90 mg/m2 = 150 mg, Intravenous,  Once, [name]50 mg (07/11/2019),  150 mg (07/12/2019), 150 mg (08/22/2019), 150 mg (08/24/2019), 150 mg (09/22/2019), 150 mg (09/23/2019), 150 mg (11/28/2019), 150 mg (11/29/2019), 150 mg (01/03/2020), 150 mg (01/04/2020), 150 mg (02/10/2020) obinutuzumab (GAZYVA) 1,[name], 1,000 mg, Intravenous, Once, [name],000 mg (07/11/2019), 1,000 mg (07/18/2019), 1,000 mg (08/22/2019), 1,000 mg (07/27/2019), 1,000 mg (09/22/2019), 1,000 mg (11/28/2019), 1,000 mg (01/03/2020)  for chemotherapy treatment.       HISTORY OF PRESENTING ILLNESS: Patient speaks minimal English/patient daughter [name] in office.  Kim Contreras 60 y.o.  female with follicular lymphoma grade 3 currently s/p cycle # 6 of Obi-Benda 4 weeks ago is here for follow-up/review results of the PET scan.  In the interim patient was admitted to hospital for Covid pneumonia.  Needed remdesivir infusion.  Did not need ICU stay.  Patient is recovering well.  Denies any worsening shortness of breath.  Continues to have mild to moderate cough.   Review of Systems  Constitutional: Positive for malaise/fatigue. Negative for chills, diaphoresis, fever and weight loss.  HENT: Negative for nosebleeds and sore throat.   Eyes: Negative for double vision.  Respiratory: Negative for cough, hemoptysis, sputum production, shortness of breath and wheezing.   Cardiovascular: Negative for chest pain, palpitations, orthopnea and leg swelling.  Gastrointestinal: Negative for blood in stool, constipation, diarrhea, heartburn, melena and vomiting.  Musculoskeletal: Positive for back pain and joint pain.  Skin: Negative.  Negative for itching and rash.  Neurological: Positive for tingling and headaches. Negative for dizziness, focal weakness and weakness.  Endo/Heme/Allergies: Does not bruise/bleed easily.  Psychiatric/Behavioral: Negative  for depression. The patient has insomnia. The patient is not nervous/anxious.       MEDICAL HISTORY:  Past Medical History:  Diagnosis Date  . Anemia 2006  . Anxiety   . Arthritis   . Helicobacter pylori gastritis   . History of hiatal hernia   . Hypertension   . Lymphoma ([place])     SURGICAL HISTORY: Past Surgical History:  Procedure Laterality Date  . APPENDECTOMY    . CHOLECYSTECTOMY N/A 01/19/2015   Procedure: LAPAROSCOPIC CHOLECYSTECTOMY;  [name], MD;  [name];  Service: General;  [name];  . COLONOSCOPY WITH PROPOFOL N/A 10/27/2014   Procedure: COLONOSCOPY WITH PROPOFOL;  [name], MD;  Location: [facility] ENDOSCOPY;  Service: Endoscopy;  [name];  . ESOPHAGOGASTRODUODENOSCOPY    . EXCISION MASS ABDOMINAL N/A 06/03/2019   Procedure: EXCISION MASS ABDOMINAL;  [name], MD;  [name];  Service: General;  [name];  abdominal node biopsy  . NASAL SINUS SURGERY    . PORTACATH PLACEMENT Left 07/08/2019   Procedure: INSERTION PORT-A-CATH;  [name], MD;  [name];  Service: General;  Laterality: Left;  . TUBAL LIGATION      SOCIAL HISTORY: Social History   Socioeconomic History  . Marital status: Married    Spouse name: Not on file  . Number of children: Not on file  . Years of education: Not on file  . Highest education level: Not on file  Occupational History  . Not on file  Tobacco Use  . Smoking status: Never Smoker  . Smokeless tobacco: Never Used  Substance and Sexual Activity  . Alcohol use: No  . Drug use: No  . Sexual activity: Not on file  Other Topics Concern  . Not on file  Social History Narrative  . Not on file   Social Determinants of Health   Financial Resource Strain: Not on file  Food Insecurity: Not on file  Transportation Needs: Not on file  Physical Activity: Not on file  Stress: Not on file  Social Connections: Not on file  Intimate Partner Violence: Not on file    FAMILY HISTORY: Family  History  Problem Relation Age of Onset  . Breast cancer Other 43  . Prostate cancer Neg Hx   . Bladder Cancer Neg Hx   . Kidney cancer Neg Hx     ALLERGIES:  is allergic to chlorhexidine.  MEDICATIONS:  Current Outpatient Medications  Medication Sig Dispense Refill  . albuterol (VENTOLIN HFA) 108 (90 Base) MCG/ACT inhaler Inhale 2 puffs into the lungs every 4 (four) hours as needed for wheezing or shortness of breath. 1 each 0  . brompheniramine-pseudoephedrine-DM 30-2-10 MG/5ML syrup Take 10 mLs by mouth 4 (four) times daily as needed. 200 mL 0  . diazepam (VALIUM) 5 MG tablet One pill 45-60 mins prior to procedure; 1 pill 15 mins prior if needed. 3 tablet 0  . levocetirizine (XYZAL) 5 MG tablet Take 5 mg by mouth every evening.    Marland Kitchen losartan (COZAAR) 50 MG tablet Take 50 mg by mouth daily.    . mirabegron ER (MYRBETRIQ) 25 MG TB24 tablet Take 1 tablet (25 mg total) by mouth daily. 30 tablet 11  . montelukast (SINGULAIR) 10 MG tablet Take 10 mg by mouth every evening.    . pantoprazole (PROTONIX) 40 MG tablet Take 1 tablet (40 mg total) by mouth daily. 30 tablet 3   No current facility-administered medications  for this visit.   Facility-Administered Medications Ordered in Other Visits  Medication Dose Route Frequency Provider Last Rate Last Admin  . sodium chloride flush (NS) 0.9 % injection 10 mL  10 mL Intravenous PRN [name], MD   10 mL at 12/28/19 0815      .  PHYSICAL EXAMINATION: ECOG PERFORMANCE STATUS: 0 - Asymptomatic  Vitals:   05/10/20 0944  BP: 132/82  Pulse: 75  Temp: (!) 97 F (36.1 C)  SpO2: 100%   Filed Weights   05/10/20 0944  Weight: 137 lb 12.8 oz (62.5 kg)    Physical Exam HENT:     Head: Normocephalic and atraumatic.     Mouth/Throat:     Pharynx: No oropharyngeal exudate.  Eyes:     Pupils: Pupils are equal, round, and reactive to light.  Cardiovascular:     Rate and Rhythm: Normal rate and regular rhythm.  Pulmonary:      Effort: Pulmonary effort is normal. No respiratory distress.     Breath sounds: Normal breath sounds. No wheezing.  Abdominal:     General: Bowel sounds are normal. There is no distension.     Palpations: Abdomen is soft. There is no mass.     Tenderness: There is no abdominal tenderness. There is no guarding or rebound.  Musculoskeletal:        General: No tenderness. Normal range of motion.     Cervical back: Normal range of motion and neck supple.     Comments: Mild swelling of the left lower extremity; mild tenderness noted.  Skin:    General: Skin is warm.  Neurological:     Mental Status: She is alert and oriented to person, place, and time.  Psychiatric:        Mood and Affect: Affect normal.      LABORATORY DATA:  I have reviewed the data as listed Lab Results  Component Value Date   WBC 2.2 (L) 05/10/2020   HGB 11.2 (L) 05/10/2020   HCT 32.3 (L) 05/10/2020   MCV 98.2 05/10/2020   PLT 294 05/10/2020   Recent Labs    10/19/19 0813 11/02/19 0815 11/21/19 0818 11/28/19 3825 12/28/19 0815 01/11/20 1355 04/19/20 0449 04/20/20 0434 05/10/20 0927  NA  --    < > 137 140 137   < > 140 140 138  K  --    < > 3.9 3.6 3.4*   < > 3.5 3.3* 4.3  CL  --    < > 105 105 100   < > 107 105 105  CO2  --    < > 24 24 25    < > 26 25 24   GLUCOSE  --    < > 105* 136* 147*   < > 104* 98 99  BUN  --    < > 23* 21* 20   < > 8 11 14   CREATININE  --    < > 0.79 0.86 0.61   < > 0.64 0.66 0.57  CALCIUM  --    < > 8.9 9.0 9.0   < > 8.2* 8.6* 9.0  GFRNONAA  --    < > >60 >60 >60   < > >60 >60 >60  GFRAA  --    < > >60 >60 >60  --   --   --   --   PROT 7.4  --   --  7.7 7.6   < > 6.2* 6.1* 7.2  ALBUMIN  4.1  --   --  4.3 4.4   < > 3.1* 2.9* 3.9  AST 34  --   --  30 21   < > 25 21 15   ALT 41  --   --  44 25   < > 22 19 15   ALKPHOS 85  --   --  178* 84   < > 71 82 80  BILITOT 0.6  --   --  0.6 0.5   < > 0.5 0.4 0.6  BILIDIR <0.1  --   --   --   --   --   --   --   --   IBILI NOT CALCULATED   --   --   --   --   --   --   --   --    < > = values in this interval not displayed.    RADIOGRAPHIC STUDIES: I have personally reviewed the radiological images as listed and agreed with the findings in the report. DG Chest 2 View  Result Date: 04/17/2020 CLINICAL DATA:  Shortness of breath, history of prior COVID-19 positivity EXAM: CHEST - 2 VIEW COMPARISON:  04/13/2020 FINDINGS: Cardiac shadow is stable. Left-sided chest wall port is again seen. Patchy bibasilar airspace opacities are again identified but slightly increased when compare with the prior exam. No sizable effusion is noted. No bony abnormality is seen. IMPRESSION: Increase in bibasilar airspace opacities when compared with the prior exam. This likely represents sequelae from the prior COVID-19 infection. Electronically Signed   By: Inez Catalina M.D.   On: 04/17/2020 19:26   DG Chest 2 View  Result Date: 04/13/2020 CLINICAL DATA:  Acute cough EXAM: CHEST - 2 VIEW COMPARISON:  Prior chest x-ray 03/28/2020 FINDINGS: Left subclavian approach single-lumen power injectable port catheter. Port catheter tip at the cavoatrial junction. Cardiac and mediastinal contours remain unchanged. Interval development of patchy airspace opacities in the lower lobes and lingula. No evidence of pleural effusion. No pneumothorax. No acute osseous abnormality. Surgical clips again noted in the right upper quadrant. IMPRESSION: 1. Interval development of patchy airspace opacities in both lower lobes and in the lingula. Findings are concerning for multifocal pneumonia. Given patient's history of lymphoma, pulmonary lymphoma is another but significantly less likely consideration. Electronically Signed   By: Jacqulynn Cadet M.D.   On: 04/13/2020 14:59   CT Angio Chest PE W and/or Wo Contrast  Result Date: 04/18/2020 CLINICAL DATA:  Shortness of breath, COVID positive EXAM: CT ANGIOGRAPHY CHEST WITH CONTRAST TECHNIQUE: Multidetector CT imaging of the chest was  performed using the standard protocol during bolus administration of intravenous contrast. Multiplanar CT image reconstructions and MIPs were obtained to evaluate the vascular anatomy. CONTRAST:  56mL OMNIPAQUE IOHEXOL 350 MG/ML SOLN COMPARISON:  None. FINDINGS: Cardiovascular: There is a optimal opacification of the pulmonary arteries. There is no central,segmental, or subsegmental filling defects within the pulmonary arteries. The heart is normal in size. No pericardial effusion or thickening. No evidence right heart strain. There is normal three-vessel brachiocephalic anatomy without proximal stenosis. The thoracic aorta is normal in appearance. Mediastinum/Nodes: No hilar, mediastinal, or axillary adenopathy. Thyroid gland, trachea, and esophagus demonstrate no significant findings. A left-sided MediPort catheter seen with the tip at the superior cavoatrial junction. Lungs/Pleura: Hazy patchy airspace opacities are seen throughout both lungs, right greater than left. This is also most notable at both lung apices. No pleural effusion or pneumothorax. Upper Abdomen: No acute abnormalities present in the visualized  portions of the upper abdomen. Musculoskeletal: No chest wall abnormality. No acute or significant osseous findings. Review of the MIP images confirms the above findings. IMPRESSION: No central, segmental, or subsegmental pulmonary embolism Multifocal patchy airspace opacities throughout both lungs, consistent with COVID pneumonia Electronically Signed   By: Prudencio Pair M.D.   On: 04/18/2020 00:50   NM PET Image Restag (PS) Skull Base To Thigh  Result Date: 05/08/2020 CLINICAL DATA:  Subsequent treatment strategy for follicular lymphoma. EXAM: NUCLEAR MEDICINE PET SKULL BASE TO THIGH TECHNIQUE: 7.4 mCi F-18 FDG was injected intravenously. Full-ring PET imaging was performed from the skull base to thigh after the radiotracer. CT data was obtained and used for attenuation correction and anatomic  localization. Fasting blood glucose: 91 mg/dl COMPARISON:  PET-CT 10/05/2019 and 05/18/2019 FINDINGS: Mediastinal blood pool activity: SUV max 2.83 Liver activity: SUV max 3.75 NECK: No neck mass or lymphadenopathy. Incidental CT findings: none CHEST: No breast masses, supraclavicular or axillary adenopathy. No enlarged or hypermetabolic mediastinal or hilar lymph nodes. There are patchy bilateral lung infiltrates, improved when compared to the prior chest CT from 04/18/2020 consistent with clearing COVID pneumonia. The somewhat rounded infiltrate in the right lower lobe is the most hypermetabolic. SUV max is 8.40. No pleural effusions. Incidental CT findings: none ABDOMEN/PELVIS: No residual/recurrent adenopathy the or hypermetabolism. Regressing matted soft tissue density consistent with treated disease. The solid abdominal organs are unremarkable. No splenomegaly or splenic lesions. No inguinal adenopathy. Incidental CT findings: Stable surgical changes near the cecum. SKELETON: No osseous abnormalities. Incidental CT findings: none IMPRESSION: 1. No findings suspicious for residual or recurrent lymphoma. [place] 1. 2. Regressing matted soft tissue density in the mesentery and retroperitoneum consistent with treated disease. 3. Hypermetabolic pulmonary infiltrates consistent with COVID pneumonia. Electronically Signed   By: Marijo Sanes M.D.   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 S/p Obi-benda x6 cylces-FEB 8th, 2022-   No findings suspicious for residual or recurrent lymphoma;  Regressing matted soft tissue density in the mesentery and retroperitoneum consistent with treated disease. Hypermetabolic pulmonary infiltrates consistent with COVID [see below]  #Again reviewed the prognosis with the patient and daughter.  Unfortunately follicular  lymphoma cannot be cured.  However lymphomas could going to "remissions"-during which patient would not need any therapy.  However will need close surveillance.  #COVID pneumonia-status post remdesivir; February 2022-PET scan shows include lung infiltrates compared to January 2022.  Improving.  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  #IV access: discussed pros and cons of continued port placement; wants to have explanted. Will make referral to IR.   # DISPOSITION: # port explanation referral # Follow up in 3 months-- ;MD; labs- cbc/cmp;LDH; Dr.B  # I reviewed the blood work- with the patient in detail; also reviewed the imaging independently [as summarized above]; and with the patient in detail.      All questions were answered. The patient knows to call the clinic with any problems, questions or concerns.    [name], MD 05/11/2020 12:55 PM

## 2020-07-01 NOTE — Progress Notes (Signed)
07/02/2020 3:53 PM   Kim Contreras 07-20-1960 [id number]  Referring provider: [facility], [place],  [place] [address]  Chief Complaint  Patient presents with  . Follow-up    1mth follow-up   Urological history: 1. SUI -managed with Kegel's  2. Urge incontinence -cysto 2021 NED -managed with Myrbetriq 25 mg daily   HPI: Kim Contreras is a 60 y.o. female who presents for a 6 month follow up with interpreter, Lowella Dandy.    She is taking the Myrbetriq 25 mg daily.   She states she feels better.  When I ask her what is better, she states that her frequency is diminished, her SUI is resolved.   The patient is  experiencing urgency x 8 or more, frequency x 8 or more, is restricting fluids to avoid visits to the restroom, not engaging in toilet mapping, incontinence x 03 and nocturia x 0-3.   Her BP is 143/88.   Her PVR is 19 mL.     PMH: Past Medical History:  Diagnosis Date  . Anemia 2006  . Anxiety   . Arthritis   . Helicobacter pylori gastritis   . History of hiatal hernia   . Hypertension   . Lymphoma [facility])     Surgical History: Past Surgical History:  Procedure Laterality Date  . APPENDECTOMY    . CHOLECYSTECTOMY N/A 01/19/2015   Procedure: LAPAROSCOPIC CHOLECYSTECTOMY;  [name], MD;  [name];  Service: General;  [name];  . COLONOSCOPY WITH PROPOFOL N/A 10/27/2014   Procedure: COLONOSCOPY WITH PROPOFOL;  [name], MD;  Location: [facility] ENDOSCOPY;  Service: Endoscopy;  [name];  . ESOPHAGOGASTRODUODENOSCOPY    . EXCISION MASS ABDOMINAL N/A 06/03/2019   Procedure: EXCISION MASS ABDOMINAL;  [name], MD;  [name];  Service: General;  [name];  abdominal node biopsy  . NASAL SINUS SURGERY    . PORTACATH PLACEMENT Left 07/08/2019   Procedure: INSERTION PORT-A-CATH;  [name], MD;  [name];   Service: General;  Laterality: Left;  . TUBAL LIGATION      Home Medications:  Allergies as of 07/02/2020      Reactions   Chlorhexidine    Not required      Medication List       Accurate as of July 02, 2020  3:53 PM. If you have any questions, ask your nurse or doctor.        STOP taking these medications   albuterol 108 (90 Base) MCG/ACT inhaler Commonly known as: VENTOLIN HFA Stopped by: Kailey Esquilin, PA-C   brompheniramine-pseudoephedrine-DM 30-2-10 MG/5ML syrup Stopped by: Shamaine Mulkern, PA-C   diazepam 5 MG tablet Commonly known as: VALIUM Stopped by: Rayleigh Gillyard, PA-C     TAKE these medications   levocetirizine 5 MG tablet Commonly known as: XYZAL Take 5 mg by mouth every evening.   losartan 50 MG tablet Commonly known as: COZAAR Take 50 mg by mouth daily.   mirabegron ER 25 MG Tb24 tablet Commonly known as: Myrbetriq Take 1 tablet (25 mg total) by mouth daily.   montelukast 10 MG tablet Commonly known as: SINGULAIR Take 10 mg by mouth every evening.   pantoprazole 40 MG tablet Commonly known as: Protonix Take 1 tablet (40 mg total) by mouth daily.       Allergies:  Allergies  Allergen Reactions  . Chlorhexidine     Not  required    Family History: Family History  Problem Relation Age of Onset  . Breast cancer Other 43  . Prostate cancer Neg Hx   . Bladder Cancer Neg Hx   . Kidney cancer Neg Hx     Social History:  reports that she has never smoked. She has never used smokeless tobacco. She reports that she does not drink alcohol and does not use drugs.  ROS: Pertinent ROS in HPI  Physical Exam: BP (!) 143/88   Pulse 85   Ht 5' (1.524 m)   Wt 140 lb (63.5 kg)   LMP 01/18/2005   BMI 27.34 kg/m   Constitutional:  Well nourished. Alert and oriented, No acute distress. HEENT: [place] AT, mask in place.  Trachea midline Cardiovascular: No clubbing, cyanosis, or edema. Respiratory: Normal respiratory effort, no increased work  of breathing. Neurologic: Grossly intact, no focal deficits, moving all 4 extremities. Psychiatric: Normal mood and affect.   Laboratory Data: Lab Results  Component Value Date   WBC 2.2 (L) 05/10/2020   HGB 11.2 (L) 05/10/2020   HCT 32.3 (L) 05/10/2020   MCV 98.2 05/10/2020   PLT 294 05/10/2020    Lab Results  Component Value Date   CREATININE 0.57 05/10/2020    Lab Results  Component Value Date   AST 15 05/10/2020   Lab Results  Component Value Date   ALT 15 05/10/2020    Urinalysis    Component Value Date/Time   COLORURINE YELLOW (A) 04/17/2020 1803   APPEARANCEUR CLEAR (A) 04/17/2020 1803   LABSPEC 1.011 04/17/2020 1803   PHURINE 6.0 04/17/2020 1803   GLUCOSEU NEGATIVE 04/17/2020 1803   HGBUR NEGATIVE 04/17/2020 1803   BILIRUBINUR NEGATIVE 04/17/2020 1803   KETONESUR 5 (A) 04/17/2020 1803   PROTEINUR NEGATIVE 04/17/2020 1803   NITRITE NEGATIVE 04/17/2020 1803   LEUKOCYTESUR TRACE (A) 04/17/2020 1803    I have reviewed the labs.   Pertinent Imaging: Results for JRUE, YAMBAO (MRN [id number]) as of 07/02/2020 15:29  Ref. Range 07/02/2020 15:11  Scan Result Unknown 20ml     Assessment & Plan:    1. SUI -resolved with Myrbetriq 25 mg daily   2. Urge incontinence -at goal with Myrbetriq 25 mg daily  -continue Myrbetriq 25 mg daily    Return in about 1 year (around 07/02/2021) for PVR and OAB questionnaire-patient needs an interpreter .  These notes generated with voice recognition software. I apologize for typographical errors.  Zara Council, PA-C  [facility] [address]  [place], [place] [address] [phone]

## 2020-07-02 ENCOUNTER — Other Ambulatory Visit: Payer: Self-pay

## 2020-07-02 ENCOUNTER — Ambulatory Visit (INDEPENDENT_AMBULATORY_CARE_PROVIDER_SITE_OTHER): Payer: Commercial Managed Care - PPO | Admitting: Urology

## 2020-07-02 ENCOUNTER — Encounter: Payer: Self-pay | Admitting: Urology

## 2020-07-02 VITALS — BP 143/88 | HR 85 | Ht 60.0 in | Wt 140.0 lb

## 2020-07-02 DIAGNOSIS — N3941 Urge incontinence: Secondary | ICD-10-CM | POA: Diagnosis not present

## 2020-07-02 DIAGNOSIS — N393 Stress incontinence (female) (male): Secondary | ICD-10-CM

## 2020-07-02 LAB — BLADDER SCAN AMB NON-IMAGING

## 2020-08-09 ENCOUNTER — Inpatient Hospital Stay: Payer: Commercial Managed Care - PPO | Attending: Oncology

## 2020-08-09 ENCOUNTER — Inpatient Hospital Stay (HOSPITAL_BASED_OUTPATIENT_CLINIC_OR_DEPARTMENT_OTHER): Payer: Commercial Managed Care - PPO | Admitting: Oncology

## 2020-08-09 DIAGNOSIS — C8238 Follicular lymphoma grade IIIa, lymph nodes of multiple sites: Secondary | ICD-10-CM | POA: Insufficient documentation

## 2020-08-09 DIAGNOSIS — R519 Headache, unspecified: Secondary | ICD-10-CM | POA: Diagnosis not present

## 2020-08-09 DIAGNOSIS — Z8616 Personal history of COVID-19: Secondary | ICD-10-CM | POA: Diagnosis not present

## 2020-08-09 DIAGNOSIS — N393 Stress incontinence (female) (male): Secondary | ICD-10-CM | POA: Diagnosis not present

## 2020-08-09 DIAGNOSIS — L659 Nonscarring hair loss, unspecified: Secondary | ICD-10-CM | POA: Diagnosis not present

## 2020-08-09 DIAGNOSIS — I10 Essential (primary) hypertension: Secondary | ICD-10-CM | POA: Insufficient documentation

## 2020-08-09 DIAGNOSIS — Z79899 Other long term (current) drug therapy: Secondary | ICD-10-CM | POA: Diagnosis not present

## 2020-08-09 DIAGNOSIS — M545 Low back pain, unspecified: Secondary | ICD-10-CM | POA: Diagnosis not present

## 2020-08-09 LAB — CBC WITH DIFFERENTIAL/PLATELET
Abs Immature Granulocytes: 0.01 10*3/uL (ref 0.00–0.07)
Basophils Absolute: 0 10*3/uL (ref 0.0–0.1)
Basophils Relative: 0 %
Eosinophils Absolute: 0.1 10*3/uL (ref 0.0–0.5)
Eosinophils Relative: 2 %
HCT: 36.2 % (ref 36.0–46.0)
Hemoglobin: 12.2 g/dL (ref 12.0–15.0)
Immature Granulocytes: 0 %
Lymphocytes Relative: 17 %
Lymphs Abs: 0.5 10*3/uL — ABNORMAL LOW (ref 0.7–4.0)
MCH: 32.3 pg (ref 26.0–34.0)
MCHC: 33.7 g/dL (ref 30.0–36.0)
MCV: 95.8 fL (ref 80.0–100.0)
Monocytes Absolute: 0.4 10*3/uL (ref 0.1–1.0)
Monocytes Relative: 14 %
Neutro Abs: 1.9 10*3/uL (ref 1.7–7.7)
Neutrophils Relative %: 67 %
Platelets: 280 10*3/uL (ref 150–400)
RBC: 3.78 MIL/uL — ABNORMAL LOW (ref 3.87–5.11)
RDW: 12.4 % (ref 11.5–15.5)
WBC: 2.9 10*3/uL — ABNORMAL LOW (ref 4.0–10.5)
nRBC: 0 % (ref 0.0–0.2)

## 2020-08-09 LAB — COMPREHENSIVE METABOLIC PANEL
ALT: 25 U/L (ref 0–44)
AST: 21 U/L (ref 15–41)
Albumin: 4.1 g/dL (ref 3.5–5.0)
Alkaline Phosphatase: 131 U/L — ABNORMAL HIGH (ref 38–126)
Anion gap: 10 (ref 5–15)
BUN: 20 mg/dL (ref 6–20)
CO2: 26 mmol/L (ref 22–32)
Calcium: 9.2 mg/dL (ref 8.9–10.3)
Chloride: 103 mmol/L (ref 98–111)
Creatinine, Ser: 0.8 mg/dL (ref 0.44–1.00)
GFR, Estimated: >60 mL/min (ref >60)
Glucose, Bld: 99 mg/dL (ref 70–99)
Potassium: 4.5 mmol/L (ref 3.5–5.1)
Sodium: 139 mmol/L (ref 135–145)
Total Bilirubin: 0.6 mg/dL (ref 0.3–1.2)
Total Protein: 7.3 g/dL (ref 6.5–8.1)

## 2020-08-09 NOTE — Progress Notes (Signed)
[place] NOTE  Patient Care Team: [facility] as PCP - General ([place]) [name], MD as Consulting Physician (Hematology and Oncology)  CHIEF COMPLAINTS/PURPOSE OF CONSULTATION: Lymphoma   Oncology History Overview Note  # 5th FEB 2021- ABDOMINAL MASS-  9.3 x 3.0 cm central mesenteric soft tissue mass is identified on image 38/series 2. 3.0x 3.0 cm collar of soft tissue surrounds branches of the superior mesenteric artery and vein on image 47/2 with relatively little mass-effect on the vascular anatomy. 2.0 x 1.5 cm nodular component of this abnormal soft tissue is identified in the mesentery of the right pelvis on 55/2. FEB 2021- PET-  PET scan-February 2021-SUV around 5.5 again highly suggestive of malignancy lymphoma.  Small cluster left supra pelvic lymph node/small retroperitoneal lymph nodes-5 mm-Douville score 3.   # MARCH 8338- grade 3A follicular lymphoma [Open biopsy Dr. 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 ([place])   07/11/2019 -  Chemotherapy   The patient had dexamethasone (DECADRON) 4 MG tablet, 8 mg, Oral, Daily, 1 of 1 cycle, Start date: --, [name], 0.25 mg, Intravenous,  Once, 6 of 6 cycles Administration: 0.25 mg (07/11/2019), 0.25 mg (08/22/2019), 0.25 mg (09/22/2019), 0.25 mg (11/28/2019), 0.25 mg (01/03/2020) pegfilgrastim-cbqv (UDENYCA) injection 6 mg, 6 mg, Subcutaneous, Once, 3 of 3 cycles Administration: 6 mg (11/30/2019), 6 mg (01/05/2020), 6 mg (02/13/2020) bendamustine (BENDEKA) 150 mg in sodium chloride 0.9 % 50 mL (2.6786 mg/mL) chemo infusion, 90 mg/m2 = 150 mg, Intravenous,  Once, [name]50 mg (07/11/2019),  150 mg (07/12/2019), 150 mg (08/22/2019), 150 mg (08/24/2019), 150 mg (09/22/2019), 150 mg (09/23/2019), 150 mg (11/28/2019), 150 mg (11/29/2019), 150 mg (01/03/2020), 150 mg (01/04/2020), 150 mg (02/10/2020) obinutuzumab (GAZYVA) 1,[name], 1,000 mg, Intravenous, Once, [name],000 mg (07/11/2019), 1,000 mg (07/18/2019), 1,000 mg (08/22/2019), 1,000 mg (07/27/2019), 1,000 mg (09/22/2019), 1,000 mg (11/28/2019), 1,000 mg (01/03/2020)  for chemotherapy treatment.       HISTORY OF PRESENTING ILLNESS: Patient speaks minimal English/patient daughter [name] in office.  Kim Contreras 60 y.o.  female with follicular lymphoma grade 3 currently s/p cycle # 6 of Obi-Benda received back in November 2021.  She was admitted to hospital for Covid pneumonia and dicharged in 04/20/20.  Needed remdesivir infusion.  Did not need ICU stay.  She was seen by Dr. Rogue Bussing on 05/10/2020 to review most recent PET scan which did not reveal any residual or recurrent lymphoma.  [place] 1.   In the interim, she continues to do well.  She had her port removed by Dr. Bary Castilla.  She was seen in urology for stress incontinence.  She was encouraged to continue Myrbetriq.  Has follow-up in 1 year.  Has intermittent headaches for the past 2 to 3 weeks and some worsening of her chronic lower back pain.  States she is currently not taking any medication for her back pain but has been seen by orthopedic in the past.  She is taking Tylenol and Excedrin for headaches with some relief.  Daughter states she is eating and drinking well.  Denies any new lumps or bumps.  States he is having some pretty significant hair loss which she has noticed more over the past couple months.  Denies any new medications.  Review of Systems  Constitutional: Negative.  Negative for chills, fever, malaise/fatigue and weight loss.  HENT: Negative for congestion, ear pain and tinnitus.    Eyes: Negative.  Negative for blurred vision and double vision.  Respiratory: Negative.  Negative for cough, sputum production and shortness of breath.   Cardiovascular: Negative.  Negative for chest pain, palpitations and leg swelling.  Gastrointestinal: Negative.  Negative for abdominal pain, constipation, diarrhea, nausea and vomiting.  Genitourinary: Negative for dysuria, frequency and urgency.  Musculoskeletal: Positive for back pain. Negative for falls.  Skin: Negative.  Negative for rash.  Neurological: Positive for headaches. Negative for weakness.  Endo/Heme/Allergies: Negative.  Does not bruise/bleed easily.  Psychiatric/Behavioral: Negative.  Negative for depression. The patient is not nervous/anxious and does not have insomnia.      MEDICAL HISTORY:  Past Medical History:  Diagnosis Date  . Anemia 2006  . Anxiety   . Arthritis   . Helicobacter pylori gastritis   . History of hiatal hernia   . Hypertension   . Lymphoma ([place])     SURGICAL HISTORY: Past Surgical History:  Procedure Laterality Date  . APPENDECTOMY    . CHOLECYSTECTOMY N/A 01/19/2015   Procedure: LAPAROSCOPIC CHOLECYSTECTOMY;  [name], MD;  [name];  Service: General;  [name];  . COLONOSCOPY WITH PROPOFOL N/A 10/27/2014   Procedure: COLONOSCOPY WITH PROPOFOL;  [name], MD;  Location: [facility] ENDOSCOPY;  Service: Endoscopy;  [name];  . ESOPHAGOGASTRODUODENOSCOPY    . EXCISION MASS ABDOMINAL N/A 06/03/2019   Procedure: EXCISION MASS ABDOMINAL;  [name], MD;  [name];  Service: General;  [name];  abdominal node biopsy  . NASAL SINUS SURGERY    . PORTACATH PLACEMENT Left 07/08/2019   Procedure: INSERTION PORT-A-CATH;  [name], MD;  [name];  Service: General;  Laterality: Left;  . TUBAL LIGATION      SOCIAL HISTORY: Social History   Socioeconomic History  . Marital  status: Married    Spouse name: Not on file  . Number of children: Not on file  . Years of education: Not on file  . Highest education level: Not on file  Occupational History  . Not on file  Tobacco Use  . Smoking status: Never Smoker  . Smokeless tobacco: Never Used  Substance and Sexual Activity  . Alcohol use: No  . Drug use: No  . Sexual activity: Not on file  Other Topics Concern  . Not on file  Social History Narrative  . Not on file   Social Determinants of Health   Financial Resource Strain: Not on file  Food Insecurity: Not on file  Transportation Needs: Not on file  Physical Activity: Not on file  Stress: Not on file  Social Connections: Not on file  Intimate Partner Violence: Not on file    FAMILY HISTORY: Family History  Problem Relation Age of Onset  . Breast cancer Other 42  . Prostate cancer Neg Hx   . Bladder Cancer Neg Hx   . Kidney cancer Neg Hx     ALLERGIES:  is allergic to chlorhexidine.  MEDICATIONS:  Current Outpatient Medications  Medication Sig Dispense Refill  . levocetirizine (XYZAL) 5 MG tablet Take 5 mg by mouth every evening.    Marland Kitchen losartan (COZAAR) 50 MG  tablet Take 50 mg by mouth daily.    . mirabegron ER (MYRBETRIQ) 25 MG TB24 tablet Take 1 tablet (25 mg total) by mouth daily. 30 tablet 11  . montelukast (SINGULAIR) 10 MG tablet Take 10 mg by mouth every evening.    . pantoprazole (PROTONIX) 40 MG tablet Take 1 tablet (40 mg total) by mouth daily. 30 tablet 3   No current facility-administered medications for this visit.   Facility-Administered Medications Ordered in Other Visits  Medication Dose Route Frequency Provider Last Rate Last Admin  . sodium chloride flush (NS) 0.9 % injection 10 mL  10 mL Intravenous PRN [name], MD   10 mL at 12/28/19 0815      .  PHYSICAL EXAMINATION: ECOG PERFORMANCE STATUS: 0 - Asymptomatic  There were no vitals filed for this visit. There were no vitals filed for this  visit.  Physical Exam Constitutional:      Appearance: Normal appearance.  HENT:     Head: Normocephalic and atraumatic.  Eyes:     Pupils: Pupils are equal, round, and reactive to light.  Cardiovascular:     Rate and Rhythm: Normal rate and regular rhythm.     Heart sounds: Normal heart sounds. No murmur heard.   Pulmonary:     Effort: Pulmonary effort is normal.     Breath sounds: Normal breath sounds. No wheezing.  Abdominal:     General: Bowel sounds are normal. There is no distension.     Palpations: Abdomen is soft.     Tenderness: There is no abdominal tenderness.  Musculoskeletal:        General: Normal range of motion.     Cervical back: Normal range of motion.  Skin:    General: Skin is warm and dry.     Findings: No rash.  Neurological:     Mental Status: She is alert and oriented to person, place, and time.     Gait: Gait is intact.  Psychiatric:        Mood and Affect: Mood and affect normal.        Cognition and Memory: Memory normal.        Judgment: Judgment normal.      LABORATORY DATA:  I have reviewed the data as listed Lab Results  Component Value Date   WBC 2.9 (L) 08/09/2020   HGB 12.2 08/09/2020   HCT 36.2 08/09/2020   MCV 95.8 08/09/2020   PLT 280 08/09/2020   Recent Labs    10/19/19 0813 11/02/19 0815 11/21/19 0818 11/28/19 1275 12/28/19 0815 01/11/20 1355 04/20/20 0434 05/10/20 0927 08/09/20 0926  NA  --    < > 137 140 137   < > 140 138 139  K  --    < > 3.9 3.6 3.4*   < > 3.3* 4.3 4.5  CL  --    < > 105 105 100   < > 105 105 103  CO2  --    < > 24 24 25    < > 25 24 26   GLUCOSE  --    < > 105* 136* 147*   < > 98 99 99  BUN  --    < > 23* 21* 20   < > 11 14 20   CREATININE  --    < > 0.79 0.86 0.61   < > 0.66 0.57 0.80  CALCIUM  --    < > 8.9 9.0 9.0   < > 8.6* 9.0  9.2  GFRNONAA  --    < > >60 >60 >60   < > >60 >60 >60  GFRAA  --    < > >60 >60 >60  --   --   --   --   PROT 7.4  --   --  7.7 7.6   < > 6.1* 7.2 7.3  ALBUMIN  4.1  --   --  4.3 4.4   < > 2.9* 3.9 4.1  AST 34  --   --  30 21   < > 21 15 21   ALT 41  --   --  44 25   < > 19 15 25   ALKPHOS 85  --   --  178* 84   < > 82 80 131*  BILITOT 0.6  --   --  0.6 0.5   < > 0.4 0.6 0.6  BILIDIR <0.1  --   --   --   --   --   --   --   --   IBILI NOT CALCULATED  --   --   --   --   --   --   --   --    < > = values in this interval not displayed.    RADIOGRAPHIC STUDIES: I have personally reviewed the radiological images as listed and agreed with the findings in the report. No results found.  ASSESSMENT & PLAN:   Follicular lymphoma grade 3; stage II [versus stage III-eqivocal neck lymph nodes]: -She is status post 6 cycles of Bendamustine and Gazyva.  -Most recent PET scan from 05/08/2020 did not reveal any progressive or residual disease. -Incidentally noted to have regressing matted soft tissue density in the mesentery and retroperitoneum consistent with treated disease. Hypermetabolic pulmonary infiltrates consistent with COVID.   COVID-pneumonia: -She was hospitalized in January 2022 for [place] and required remdesivir. -She has recovered.  Leukopenia/neutropenia: -Secondary to chemotherapy. -Labs from 08/09/2020 show a white blood cell count of 2.9 and ANC of 1900. -This continues to improve.   Headaches: - For 2 to 3 weeks. -Has been taking Excedrin and Tylenol with some relief. -Continue to monitor.  Low back pain/body pain: -Unclear etiology. -Continue to monitor.  Hair loss: - Has been fairly profound over the past 2 to 3 months. -Recommend discussion with PCP regarding hair loss. -Likely unrelated to previous treatment given that was greater than 5 months ago.  Disposition: RTC in 3 months for repeat labs (CBC, CMP and LDH) and MD assessment.  Greater than 50% was spent in counseling and coordination of care with this patient including but not limited to discussion of the relevant topics above (See A&P) including, but not limited to  diagnosis and management of acute and chronic medical conditions.   No problem-specific Assessment & Plan notes found for this encounter.  All questions were answered. The patient knows to call the clinic with any problems, questions or concerns.    Jacquelin Hawking, NP 08/09/2020 9:57 AM

## 2020-08-24 ENCOUNTER — Encounter: Payer: Self-pay | Admitting: Otolaryngology

## 2020-09-18 ENCOUNTER — Telehealth: Payer: Self-pay | Admitting: Internal Medicine

## 2020-09-18 NOTE — Telephone Encounter (Signed)
Patient's daughter called and stated that patient's port is "really bothering her" and she would like to know if there is an update as to whether or not she can have her port removed.  Please advice, Anderson [place]

## 2020-09-18 NOTE — Telephone Encounter (Signed)
DR. B - Please advise if port can be removed or if pt needs apt in [facility] to evaluate port concerns.

## 2020-09-19 ENCOUNTER — Telehealth: Payer: Self-pay | Admitting: Internal Medicine

## 2020-09-19 DIAGNOSIS — C8238 Follicular lymphoma grade IIIa, lymph nodes of multiple sites: Secondary | ICD-10-CM

## 2020-09-19 DIAGNOSIS — Z452 Encounter for adjustment and management of vascular access device: Secondary | ICD-10-CM

## 2020-09-19 NOTE — Telephone Encounter (Signed)
On 6/22-spoke to patient's daughter [name] interested had a port taken out/discomfort.  No acute process.   Please send a referral to have the port taken out. Thanks GB

## 2020-09-19 NOTE — Telephone Encounter (Signed)
Please advise 

## 2020-09-20 NOTE — Addendum Note (Signed)
Addended by: Gloris Ham on: 09/20/2020 08:38 AM   Modules accepted: Orders

## 2020-09-20 NOTE — Telephone Encounter (Signed)
Port removal faxed to Dr. Dwyane Luo office

## 2020-09-28 HISTORY — PX: PORT-A-CATH REMOVAL: SHX5289

## 2020-11-07 ENCOUNTER — Other Ambulatory Visit: Payer: Self-pay | Admitting: *Deleted

## 2020-11-07 DIAGNOSIS — C8238 Follicular lymphoma grade IIIa, lymph nodes of multiple sites: Secondary | ICD-10-CM

## 2020-11-08 ENCOUNTER — Encounter: Payer: Self-pay | Admitting: Internal Medicine

## 2020-11-08 ENCOUNTER — Inpatient Hospital Stay: Payer: Commercial Managed Care - PPO | Admitting: Internal Medicine

## 2020-11-08 ENCOUNTER — Inpatient Hospital Stay: Payer: Commercial Managed Care - PPO | Attending: Internal Medicine

## 2020-11-08 DIAGNOSIS — Z803 Family history of malignant neoplasm of breast: Secondary | ICD-10-CM | POA: Insufficient documentation

## 2020-11-08 DIAGNOSIS — D701 Agranulocytosis secondary to cancer chemotherapy: Secondary | ICD-10-CM | POA: Insufficient documentation

## 2020-11-08 DIAGNOSIS — R5382 Chronic fatigue, unspecified: Secondary | ICD-10-CM | POA: Diagnosis not present

## 2020-11-08 DIAGNOSIS — R490 Dysphonia: Secondary | ICD-10-CM | POA: Insufficient documentation

## 2020-11-08 DIAGNOSIS — C8220 Follicular lymphoma grade III, unspecified, unspecified site: Secondary | ICD-10-CM | POA: Diagnosis not present

## 2020-11-08 DIAGNOSIS — C8238 Follicular lymphoma grade IIIa, lymph nodes of multiple sites: Secondary | ICD-10-CM

## 2020-11-08 DIAGNOSIS — G8929 Other chronic pain: Secondary | ICD-10-CM | POA: Diagnosis not present

## 2020-11-08 DIAGNOSIS — L659 Nonscarring hair loss, unspecified: Secondary | ICD-10-CM

## 2020-11-08 DIAGNOSIS — R059 Cough, unspecified: Secondary | ICD-10-CM | POA: Diagnosis not present

## 2020-11-08 LAB — COMPREHENSIVE METABOLIC PANEL
ALT: 21 U/L (ref 0–44)
AST: 21 U/L (ref 15–41)
Albumin: 4.3 g/dL (ref 3.5–5.0)
Alkaline Phosphatase: 157 U/L — ABNORMAL HIGH (ref 38–126)
Anion gap: 9 (ref 5–15)
BUN: 17 mg/dL (ref 6–20)
CO2: 26 mmol/L (ref 22–32)
Calcium: 9.3 mg/dL (ref 8.9–10.3)
Chloride: 103 mmol/L (ref 98–111)
Creatinine, Ser: 0.85 mg/dL (ref 0.44–1.00)
GFR, Estimated: >60 mL/min (ref >60)
Glucose, Bld: 104 mg/dL — ABNORMAL HIGH (ref 70–99)
Potassium: 4.3 mmol/L (ref 3.5–5.1)
Sodium: 138 mmol/L (ref 135–145)
Total Bilirubin: 0.5 mg/dL (ref 0.3–1.2)
Total Protein: 7.8 g/dL (ref 6.5–8.1)

## 2020-11-08 LAB — CBC WITH DIFFERENTIAL/PLATELET
Abs Immature Granulocytes: 0.01 10*3/uL (ref 0.00–0.07)
Basophils Absolute: 0 10*3/uL (ref 0.0–0.1)
Basophils Relative: 1 %
Eosinophils Absolute: 0.1 10*3/uL (ref 0.0–0.5)
Eosinophils Relative: 2 %
HCT: 36.8 % (ref 36.0–46.0)
Hemoglobin: 12.1 g/dL (ref 12.0–15.0)
Immature Granulocytes: 0 %
Lymphocytes Relative: 19 %
Lymphs Abs: 0.7 10*3/uL (ref 0.7–4.0)
MCH: 31.7 pg (ref 26.0–34.0)
MCHC: 32.9 g/dL (ref 30.0–36.0)
MCV: 96.3 fL (ref 80.0–100.0)
Monocytes Absolute: 0.6 10*3/uL (ref 0.1–1.0)
Monocytes Relative: 15 %
Neutro Abs: 2.5 10*3/uL (ref 1.7–7.7)
Neutrophils Relative %: 63 %
Platelets: 322 10*3/uL (ref 150–400)
RBC: 3.82 MIL/uL — ABNORMAL LOW (ref 3.87–5.11)
RDW: 13.7 % (ref 11.5–15.5)
WBC: 3.9 10*3/uL — ABNORMAL LOW (ref 4.0–10.5)
nRBC: 0 % (ref 0.0–0.2)

## 2020-11-08 LAB — LACTATE DEHYDROGENASE: LDH: 217 U/L — ABNORMAL HIGH (ref 98–192)

## 2020-11-08 NOTE — Assessment & Plan Note (Addendum)
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 S/p Obi-benda x6 cylces-FEB 8th, 2022-   No findings suspicious for residual or recurrent lymphoma;  Regressing matted soft tissue density in the mesentery and retroperitoneum consistent with treated disease.   # Hoarseness of voice/neck swollen- 2-3 months x? Acid reflux; if not improved recommend referral to ENT.  Recommend Prilosec/PPI over-the-counter twice a day; 60 minutes prior to meal.  Sitting upright for 2 to 3 hours post a meal.   # chronic pain right quadrant abdmonal pain/chornic- EGD/[facility] in nov 2022 [2016-[facility]]  #Leukopenia/neutropenia [ANC1.1] secondary to chemotherapy- Gazyva-monitor for now.  # FATIGUE; ? Sec to chemo vs. COVID- recommend exercise/referral to care program.   #IV access: s/p port explantation  # DISPOSITION: # [place] care program referral/information # Follow up in 3 months-- ;MD; labs- cbc/cmp;LDH; Dr.B

## 2020-11-08 NOTE — Progress Notes (Signed)
Noted concerns of feeling very tired, low energy, and gets cold very easily. Has pain that starts in right side that radiates down to her leg.

## 2020-11-08 NOTE — Progress Notes (Signed)
Kim Contreras NOTE  Patient Care Team: Center, [facility] as PCP - General ([place]) [name], [name], MD as Consulting Physician (General Surgery)  CHIEF COMPLAINTS/PURPOSE OF CONSULTATION: Lymphoma   Oncology History Overview Note  # 5th FEB 2021- ABDOMINAL MASS-  9.3 x 3.0 cm central mesenteric soft tissue mass is identified on image 38/series 2. 3.0x 3.0 cm collar of soft tissue surrounds branches of the superior mesenteric artery and vein on image 47/2 with relatively little mass-effect on the vascular anatomy. 2.0 x 1.5 cm nodular component of this abnormal soft tissue is identified in the mesentery of the right pelvis on 55/2. FEB 2021- PET-  PET scan-February 2021-SUV around 5.5 again highly suggestive of malignancy lymphoma.  Small cluster left supra pelvic lymph node/small retroperitoneal lymph nodes-5 mm-Douville score 3.   # MARCH [id number]- grade 3A follicular lymphoma [Open biopsy Dr. Burnett-] STAGE II vs III (equivocal neck lymph node) [no bone marrow bx - #Status post-Gazyva-Bendamustine x6 cycles [finished August 2021]- PET scan February, 2022-treated disease noted to have any progression.  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 ([place])   07/11/2019 -  Chemotherapy   The patient had dexamethasone (DECADRON) 4 MG tablet, 8 mg, Oral, Daily, 1 of 1 cycle, Start date: --, [name], 0.25 mg, Intravenous,  Once, 6 of 6 cycles Administration: 0.25 mg (07/11/2019), 0.25 mg (08/22/2019), 0.25 mg (09/22/2019), 0.25 mg (11/28/2019), 0.25 mg (01/03/2020) pegfilgrastim-cbqv (UDENYCA) injection 6 mg, 6 mg, Subcutaneous, Once, 3 of 3 cycles Administration: 6 mg (11/30/2019), 6 mg  (01/05/2020), 6 mg (02/13/2020) bendamustine (BENDEKA) 150 mg in sodium chloride 0.9 % 50 mL (2.6786 mg/mL) chemo infusion, 90 mg/m2 = 150 mg, Intravenous,  Once, [name]50 mg (07/11/2019), 150 mg (07/12/2019), 150 mg (08/22/2019), 150 mg (08/24/2019), 150 mg (09/22/2019), 150 mg (09/23/2019), 150 mg (11/28/2019), 150 mg (11/29/2019), 150 mg (01/03/2020), 150 mg (01/04/2020), 150 mg (02/10/2020) obinutuzumab (GAZYVA) 1,[name], 1,000 mg, Intravenous, Once, [name],000 mg (07/11/2019), 1,000 mg (07/18/2019), 1,000 mg (08/22/2019), 1,000 mg (07/27/2019), 1,000 mg (09/22/2019), 1,000 mg (11/28/2019), 1,000 mg (01/03/2020)   for chemotherapy treatment.        HISTORY OF PRESENTING ILLNESS: Patient speaks minimal English/patient daughter [name] in office.  Kim Contreras 60 y.o.  female with follicular lymphoma grade 3 status post Gazyva-Benda currently on surveillance is here for follow-up.  In the interim patient underwent port explantation.  She continues to complain of chronic fatigue.   Patient also complains of hoarseness of voice.  Also complains of mild cough-especially mornings.  No fever no chills.   Review of Systems  Constitutional:  Positive for malaise/fatigue. Negative for chills, diaphoresis, [name], hemoptysis, sputum production, [name], palpitations, [name], diarrhea, heartburn, melena and vomiting.  Musculoskeletal:  Positive for back pain and joint pain.  Skin: Negative.  Negative for itching and  rash.  Neurological:  Positive for tingling. Negative for dizziness, focal weakness and  weakness.  Endo/Heme/Allergies:  Does not bruise/bleed easily.  Psychiatric/Behavioral:  Negative for depression. The patient is not nervous/anxious.     MEDICAL HISTORY:  Past Medical History:  Diagnosis Date   Anemia 2006   Anxiety    Arthritis    Helicobacter pylori gastritis    History of hiatal hernia    Hypertension    Lymphoma ([place])     SURGICAL HISTORY: Past Surgical History:  Procedure Laterality Date   APPENDECTOMY     CHOLECYSTECTOMY N/A 01/19/2015   Procedure: LAPAROSCOPIC CHOLECYSTECTOMY;  [name], MD;  [name];  Service: General;  [name];   COLONOSCOPY WITH PROPOFOL N/A 10/27/2014   Procedure: COLONOSCOPY WITH PROPOFOL;  [name], MD;  Location: [facility] ENDOSCOPY;  Service: Endoscopy;  [name];   ESOPHAGOGASTRODUODENOSCOPY     EXCISION MASS ABDOMINAL N/A 06/03/2019   Procedure: EXCISION MASS ABDOMINAL;  [name], MD;  [name];  Service: General;  [name];  abdominal node biopsy   NASAL SINUS SURGERY     PORTACATH PLACEMENT Left 07/08/2019   Procedure: INSERTION PORT-A-CATH;  [name], MD;  [name];  Service: General;  Laterality: Left;   TUBAL LIGATION      SOCIAL HISTORY: Social History   Socioeconomic History   Marital status: Married    Spouse name: Not on file   Number of children: Not on file   Years of education: Not on file   Highest education level: Not on file  Occupational History   Not on file  Tobacco Use   Smoking status: Never   Smokeless tobacco: Never  Substance and Sexual Activity   Alcohol use: No   Drug use: No   Sexual activity: Not on file  Other Topics Concern   Not on file  Social History Narrative   Not on file   Social Determinants of Health   Financial Resource Strain: Not on file  Food Insecurity: Not on file  Transportation Needs: Not on file  Physical Activity: Not on file  Stress: Not on  file  Social Connections: Not on file  Intimate Partner Violence: Not on file    FAMILY HISTORY: Family History  Problem Relation Age of Onset   Breast cancer Other 50   Prostate cancer Neg Hx    Bladder Cancer Neg Hx    Kidney cancer Neg Hx     ALLERGIES:  is allergic to chlorhexidine.  MEDICATIONS:  Current Outpatient Medications  Medication Sig Dispense Refill   levocetirizine (XYZAL) 5 MG tablet Take 5 mg by mouth every evening.     losartan (COZAAR) 50 MG tablet Take 50 mg by mouth daily.     mirabegron ER (MYRBETRIQ) 25 MG TB24 tablet Take 1 tablet (25 mg total) by mouth daily. 30 tablet 11   montelukast (SINGULAIR) 10 MG tablet Take 10 mg by mouth every evening.     pantoprazole (PROTONIX) 40 MG tablet Take 1 tablet (40 mg total) by mouth daily. 30 tablet 3   gabapentin (NEURONTIN) 100 MG capsule Take 100 mg by mouth daily.     No current facility-administered medications for this visit.   Facility-Administered Medications Ordered in Other Visits  Medication Dose Route Frequency Provider Last Rate Last Admin   sodium chloride flush (NS) 0.9 % injection 10 mL  10 mL Intravenous PRN [name], MD  10 mL at 12/28/19 0815      .  PHYSICAL EXAMINATION: ECOG PERFORMANCE STATUS: 0 - Asymptomatic  Vitals:   11/08/20 1001  BP: 113/81  Pulse: 72  Resp: 16  Temp: (!) 97.5 F (36.4 C)  SpO2: 98%   Filed Weights   11/08/20 1001  Weight: 137 lb 6.4 oz (62.3 kg)    Physical Exam HENT:     Head: Normocephalic and atraumatic.     Mouth/Throat:     Pharynx: No oropharyngeal exudate.  Eyes:     Pupils: Pupils are equal, round, and reactive to light.  Cardiovascular:     Rate and Rhythm: Normal rate and regular rhythm.  Pulmonary:     Effort: Pulmonary effort is normal. No respiratory distress.     Breath sounds: Normal breath sounds. No wheezing.  Abdominal:     General: Bowel sounds are normal. There is no distension.     Palpations: Abdomen is  soft. There is no mass.     Tenderness: There is no abdominal tenderness. There is no guarding or rebound.  Musculoskeletal:        General: No tenderness. Normal range of motion.     Cervical back: Normal range of motion and neck supple.     Comments: Mild swelling of the left lower extremity; mild tenderness noted.  Skin:    General: Skin is warm.  Neurological:     Mental Status: She is alert and oriented to person, place, and time.  Psychiatric:        Mood and Affect: Affect normal.     LABORATORY DATA:  I have reviewed the data as listed Lab Results  Component Value Date   WBC 3.9 (L) 11/08/2020   HGB 12.1 11/08/2020   HCT 36.8 11/08/2020   MCV 96.3 11/08/2020   PLT 322 11/08/2020   Recent Labs    11/21/19 0818 11/21/19 0818 11/28/19 0814 12/28/19 0815 01/11/20 1355 05/10/20 0927 08/09/20 0926 11/08/20 0938  NA 137  --  140 137   < > 138 139 138  K 3.9  --  3.6 3.4*   < > 4.3 4.5 4.3  CL 105  --  105 100   < > 105 103 103  CO2 24  --  24 25   < > '24 26 26  '$ GLUCOSE 105*  --  136* 147*   < > 99 99 104*  BUN 23*  --  21* 20   < > '14 20 17  '$ CREATININE 0.79  --  0.86 0.61   < > 0.57 0.80 0.85  CALCIUM 8.9  --  9.0 9.0   < > 9.0 9.2 9.3  GFRNONAA >60  --  >60 >60   < > >60 >60 >60  GFRAA >60  --  >60 >60  --   --   --   --   PROT  --    < > 7.7 7.6   < > 7.2 7.3 7.8  ALBUMIN  --    < > 4.3 4.4   < > 3.9 4.1 4.3  AST  --    < > 30 21   < > '15 21 21  '$ ALT  --    < > 44 25   < > '15 25 21  '$ ALKPHOS  --    < > 178* 84   < > 80 131* 157*  BILITOT  --    < > 0.6 0.5   < >  0.6 0.6 0.5   < > = values in this interval not displayed.    RADIOGRAPHIC STUDIES: I have personally reviewed the radiological images as listed and agreed with the findings in the report. No results found.   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 S/p Obi-benda  x6 cylces-FEB 8th, 2022-   No findings suspicious for residual or recurrent lymphoma;  Regressing matted soft tissue density in the mesentery and retroperitoneum consistent with treated disease. Hypermetabolic pulmonary infiltrates consistent with COVID [see below]  # Hoarseness of voice/neck swollen- 2-3 months x? Acid reflux; if not improved recommend referral to ENT.   # chronic pain right quadrant abdmonal pain/chornic-.. 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  # FATIGUE; ? Sec to chemo vs. COVID- recommend exercise  #IV access: s/p port exaplnation  # DISPOSITION: # [facility] care program referral/information # Follow up in 3 months-- ;MD; labs- cbc/cmp;LDH; Dr.B     All questions were answered. The patient knows to call the clinic with any problems, questions or concerns.    [name], MD 11/10/2020 6:00 PM

## 2020-11-08 NOTE — Patient Instructions (Signed)
Care Program Phone: [phone]

## 2020-11-09 LAB — THYROID PANEL WITH TSH
Free Thyroxine Index: 2.5 (ref 1.2–4.9)
T3 Uptake Ratio: 23 % — ABNORMAL LOW (ref 24–39)
T4, Total: 10.7 ug/dL (ref 4.5–12.0)
TSH: 1.29 u[IU]/mL (ref 0.450–4.500)

## 2020-11-10 ENCOUNTER — Encounter: Payer: Self-pay | Admitting: Internal Medicine

## 2021-01-21 ENCOUNTER — Telehealth: Payer: Self-pay | Admitting: *Deleted

## 2021-01-21 NOTE — Telephone Encounter (Signed)
Daughter [name] called reporting that patient has not been feeling well for a couple of days and has been having chills and a cough. She tested for COVID, but was negative. She wanted to know if she would be ok to have the EGD on 02/05/21. I told her that yes, she should proceed with the EGD since it is 2 weeks away and she should feel better by then. She agreed to keep that appointment.

## 2021-01-23 ENCOUNTER — Encounter: Payer: Self-pay | Admitting: Internal Medicine

## 2021-01-27 ENCOUNTER — Emergency Department: Payer: Commercial Managed Care - PPO

## 2021-01-27 ENCOUNTER — Other Ambulatory Visit: Payer: Self-pay

## 2021-01-27 DIAGNOSIS — I10 Essential (primary) hypertension: Secondary | ICD-10-CM | POA: Diagnosis not present

## 2021-01-27 DIAGNOSIS — Z20822 Contact with and (suspected) exposure to covid-19: Secondary | ICD-10-CM | POA: Diagnosis not present

## 2021-01-27 DIAGNOSIS — Z8616 Personal history of COVID-19: Secondary | ICD-10-CM | POA: Insufficient documentation

## 2021-01-27 DIAGNOSIS — J181 Lobar pneumonia, unspecified organism: Secondary | ICD-10-CM | POA: Diagnosis not present

## 2021-01-27 DIAGNOSIS — Z79899 Other long term (current) drug therapy: Secondary | ICD-10-CM | POA: Insufficient documentation

## 2021-01-27 DIAGNOSIS — R0602 Shortness of breath: Secondary | ICD-10-CM | POA: Diagnosis present

## 2021-01-27 LAB — CBC WITH DIFFERENTIAL/PLATELET
Abs Immature Granulocytes: 0.02 10*3/uL (ref 0.00–0.07)
Basophils Absolute: 0 10*3/uL (ref 0.0–0.1)
Basophils Relative: 1 %
Eosinophils Absolute: 0.1 10*3/uL (ref 0.0–0.5)
Eosinophils Relative: 2 %
HCT: 33.9 % — ABNORMAL LOW (ref 36.0–46.0)
Hemoglobin: 11.7 g/dL — ABNORMAL LOW (ref 12.0–15.0)
Immature Granulocytes: 1 %
Lymphocytes Relative: 26 %
Lymphs Abs: 0.9 10*3/uL (ref 0.7–4.0)
MCH: 32.1 pg (ref 26.0–34.0)
MCHC: 34.5 g/dL (ref 30.0–36.0)
MCV: 93.1 fL (ref 80.0–100.0)
Monocytes Absolute: 0.5 10*3/uL (ref 0.1–1.0)
Monocytes Relative: 15 %
Neutro Abs: 1.9 10*3/uL (ref 1.7–7.7)
Neutrophils Relative %: 55 %
Platelets: 355 10*3/uL (ref 150–400)
RBC: 3.64 MIL/uL — ABNORMAL LOW (ref 3.87–5.11)
RDW: 13.4 % (ref 11.5–15.5)
WBC: 3.3 10*3/uL — ABNORMAL LOW (ref 4.0–10.5)
nRBC: 0 % (ref 0.0–0.2)

## 2021-01-27 LAB — COMPREHENSIVE METABOLIC PANEL
ALT: 31 U/L (ref 0–44)
AST: 29 U/L (ref 15–41)
Albumin: 3.6 g/dL (ref 3.5–5.0)
Alkaline Phosphatase: 179 U/L — ABNORMAL HIGH (ref 38–126)
Anion gap: 10 (ref 5–15)
BUN: 11 mg/dL (ref 6–20)
CO2: 26 mmol/L (ref 22–32)
Calcium: 8.6 mg/dL — ABNORMAL LOW (ref 8.9–10.3)
Chloride: 101 mmol/L (ref 98–111)
Creatinine, Ser: 0.68 mg/dL (ref 0.44–1.00)
GFR, Estimated: >60 mL/min (ref >60)
Glucose, Bld: 104 mg/dL — ABNORMAL HIGH (ref 70–99)
Potassium: 3.8 mmol/L (ref 3.5–5.1)
Sodium: 137 mmol/L (ref 135–145)
Total Bilirubin: 0.5 mg/dL (ref 0.3–1.2)
Total Protein: 7.3 g/dL (ref 6.5–8.1)

## 2021-01-27 NOTE — ED Triage Notes (Signed)
Pt c/o cough with congest and SOB for the past month with some sweats. Has been seen at [facility] drew but has not been prescribed any medication for the cough. Had a negative covid swab, hx of breast CA and low WBC in the past

## 2021-01-28 ENCOUNTER — Other Ambulatory Visit: Payer: Self-pay

## 2021-01-28 ENCOUNTER — Emergency Department: Payer: Commercial Managed Care - PPO

## 2021-01-28 ENCOUNTER — Emergency Department
Admission: EM | Admit: 2021-01-28 | Discharge: 2021-01-28 | Disposition: A | Discharge status: Home / Self Care | Payer: Commercial Managed Care - PPO | Attending: Emergency Medicine | Admitting: Emergency Medicine

## 2021-01-28 DIAGNOSIS — J189 Pneumonia, unspecified organism: Secondary | ICD-10-CM

## 2021-01-28 LAB — PROCALCITONIN: Procalcitonin: <0.1 ng/mL

## 2021-01-28 LAB — RESP PANEL BY RT-PCR (FLU A&B, COVID) ARPGX2
Influenza A by PCR: NEGATIVE
Influenza B by PCR: NEGATIVE
SARS Coronavirus 2 by RT PCR: NEGATIVE

## 2021-01-28 LAB — LACTIC ACID, PLASMA: Lactic Acid, Venous: 0.7 mmol/L (ref 0.5–1.9)

## 2021-01-28 LAB — TROPONIN I (HIGH SENSITIVITY): Troponin I (High Sensitivity): <2 ng/L (ref <18)

## 2021-01-28 LAB — HIV ANTIBODY (ROUTINE TESTING W REFLEX): HIV Screen 4th Generation wRfx: NONREACTIVE

## 2021-01-28 MED ORDER — SODIUM CHLORIDE 0.9 % IV SOLN
1.0000 g | Freq: Once | INTRAVENOUS | Status: AC
  Administered 2021-01-28: 1 g via INTRAVENOUS
  Filled 2021-01-28: qty 10

## 2021-01-28 MED ORDER — IOHEXOL 350 MG/ML SOLN
75.0000 mL | Freq: Once | INTRAVENOUS | Status: AC | PRN
  Administered 2021-01-28: 75 mL via INTRAVENOUS

## 2021-01-28 MED ORDER — SODIUM CHLORIDE 0.9 % IV SOLN
500.0000 mg | Freq: Once | INTRAVENOUS | Status: AC
  Administered 2021-01-28: 500 mg via INTRAVENOUS
  Filled 2021-01-28: qty 500

## 2021-01-28 MED ORDER — LACTATED RINGERS IV BOLUS
1000.0000 mL | Freq: Once | INTRAVENOUS | Status: AC
  Administered 2021-01-28: 1000 mL via INTRAVENOUS

## 2021-01-28 MED ORDER — CEFDINIR 300 MG PO CAPS: 300.0000 mg | ORAL_CAPSULE | Freq: Two times a day (BID) | ORAL | 0 refills | Status: DC

## 2021-01-28 MED ORDER — AZITHROMYCIN 250 MG PO TABS: ORAL_TABLET | ORAL | 0 refills | Status: AC

## 2021-01-28 MED ORDER — BENZONATATE 100 MG PO CAPS: 100.0000 mg | ORAL_CAPSULE | Freq: Three times a day (TID) | ORAL | 0 refills | Status: DC | PRN

## 2021-01-28 NOTE — ED Provider Notes (Signed)
So Kim Contreras  ____________________________________________   Event Date/Time   First MD Initiated Contact with Patient 01/28/21 0301     (approximate)  I have reviewed the triage vital signs and the nursing notes.   HISTORY  Chief Complaint Shortness of Breath and Cough    HPI Kim Contreras is a 60 y.o. female with history of lymphoma status post chemotherapy in 2021, hypertension who presents to the emergency department with reports of productive cough for the past 24 days with subjective fevers, night sweats, shortness of breath.  States she has a central chest pain and back pain that feels like "something is stuck in there".  States she has been feeling tired.  No vomiting, diarrhea.  No sick contacts or recent travel.  Has had pneumonia before and was concerned this could be pneumonia again.  States she saw her primary care doctor at [facility] and was given cough syrup which has not helped.  Has not been on antibiotics.  No history of PE or DVT.  No lower extremity swelling or pain.    Spanish interpreter used.   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   Lymphoma ([place]) 06/03/2019   Irregularly shaped mass of abdomen 05/09/2019    Past Surgical History:  Procedure Laterality Date   APPENDECTOMY     CHOLECYSTECTOMY N/A 01/19/2015   Procedure: LAPAROSCOPIC  CHOLECYSTECTOMY;  [name], MD;  [name];  Service: General;  [name];   COLONOSCOPY WITH PROPOFOL N/A 10/27/2014   Procedure: COLONOSCOPY WITH PROPOFOL;  [name], MD;  Location: [facility] ENDOSCOPY;  Service: Endoscopy;  [name];   ESOPHAGOGASTRODUODENOSCOPY     EXCISION MASS ABDOMINAL N/A 06/03/2019   Procedure: EXCISION MASS ABDOMINAL;  [name], MD;  [name];  Service: General;  [name];  abdominal node biopsy   NASAL SINUS SURGERY     PORTACATH PLACEMENT Left 07/08/2019   Procedure: INSERTION PORT-A-CATH;  [name], MD;  [name];  Service: General;  Laterality: Left;   TUBAL LIGATION      Prior to Admission medications   Medication Sig Start Date End Date Taking? Authorizing Provider  azithromycin (ZITHROMAX Z-PAK) 250 MG tablet Take 2 tablets (500 mg) on  Day 1,  followed by 1 tablet (250 mg) once daily on Days 2 through 5. 01/28/21 02/02/21 Yes Hanan Mcwilliams, Delice Bison, DO  benzonatate (TESSALON PERLES) 100 MG capsule Take 1 capsule (100 mg total) by mouth 3 (three) times daily as needed for cough. 01/28/21 01/28/22 Yes Daquan Crapps, Delice Bison, DO  cefdinir (OMNICEF) 300 MG capsule Take 1 capsule (300 mg total) by mouth 2 (two) times daily. 01/28/21  Yes Lacoya Wilbanks, Delice Bison, DO  gabapentin (NEURONTIN) 100 MG capsule Take 100 mg by mouth daily. 10/08/20   [provider]  levocetirizine (  XYZAL) 5 MG tablet Take 5 mg by mouth every evening.    [provider]  losartan (COZAAR) 50 MG tablet Take 50 mg by mouth daily.    [provider]  mirabegron ER (MYRBETRIQ) 25 MG TB24 tablet Take 1 tablet (25 mg total) by mouth daily. 12/30/19   Hollice Espy, MD  montelukast (SINGULAIR) 10 MG tablet Take 10 mg by mouth every evening. 03/30/20   [provider]  pantoprazole (PROTONIX) 40 MG tablet Take 1 tablet (40 mg total) by mouth daily. 01/11/20   Borders,  Kirt Boys, NP    Allergies Chlorhexidine  Family History  Problem Relation Age of Onset   Breast cancer Other 47   Prostate cancer Neg Hx    Bladder Cancer Neg Hx    Kidney cancer Neg Hx     Social History Social History   Tobacco Use   Smoking status: Never   Smokeless tobacco: Never  Substance Use Topics   Alcohol use: No   Drug use: No    Review of Systems Constitutional: Subjective fever. Eyes: No visual changes. ENT: No sore throat. Cardiovascular: +chest pain. Respiratory: + shortness of breath. Gastrointestinal: No nausea, vomiting, diarrhea. Genitourinary: Negative for dysuria. Musculoskeletal: Negative for back pain. Skin: Negative for rash. Neurological: Negative for focal weakness or numbness.  ____________________________________________   PHYSICAL EXAM:  VITAL SIGNS: ED Triage Vitals  Enc Vitals Group     BP 01/27/21 1851 132/81     Pulse Rate 01/27/21 1851 92     Resp 01/27/21 1851 16     Temp 01/27/21 1851 99.3 F (37.4 C)     Temp Source 01/27/21 1851 Oral     SpO2 01/27/21 1851 95 %     Weight 01/27/21 1854 135 lb (61.2 kg)     Height 01/27/21 1854 5' (1.524 m)     Head Circumference --      Peak Flow --      Pain Score 01/27/21 1854 0     Pain Loc --      Pain Edu? --      Excl. in [place]? --    CONSTITUTIONAL: Alert and oriented and responds appropriately to questions. Well-appearing; well-nourished, afebrile, nontoxic HEAD: Normocephalic EYES: Conjunctivae clear, pupils appear equal, EOM appear intact ENT: normal nose; moist mucous membranes NECK: Supple, normal ROM CARD: RRR; S1 and S2 appreciated; no murmurs, no clicks, no rubs, no gallops RESP: Normal chest excursion without splinting or tachypnea; breath sounds clear and equal bilaterally; no wheezes, no rhonchi, no rales, no hypoxia or respiratory distress, speaking full sentences ABD/GI: Normal bowel sounds; non-distended; soft, non-tender, no rebound, no guarding, no peritoneal  signs, no hepatosplenomegaly BACK: The back appears normal EXT: Normal ROM in all joints; no deformity noted, no edema; no cyanosis, no calf tenderness or calf swelling SKIN: Normal color for age and race; warm; no rash on exposed skin NEURO: Moves all extremities equally PSYCH: The patient's mood and manner are appropriate.  ____________________________________________   LABS (all labs ordered are listed, but only abnormal results are displayed)  Labs Reviewed  CBC WITH DIFFERENTIAL/PLATELET - Abnormal; Notable for the following components:      Result Value   WBC 3.3 (*)    RBC 3.64 (*)    Hemoglobin 11.7 (*)    HCT 33.9 (*)    All other components within normal limits  COMPREHENSIVE METABOLIC PANEL - Abnormal; Notable for the following components:   Glucose, Bld 104 (*)  Calcium 8.6 (*)    Alkaline Phosphatase 179 (*)    All other components within normal limits  RESP PANEL BY RT-PCR (FLU A&B, COVID) ARPGX2  CULTURE, BLOOD (ROUTINE X 2)  CULTURE, BLOOD (ROUTINE X 2)  PROCALCITONIN  LACTIC ACID, PLASMA  HIV ANTIBODY (ROUTINE TESTING W REFLEX)  TROPONIN I (HIGH SENSITIVITY)   ____________________________________________  EKG   Date: 01/28/2021 3:24 AM  Rate: 70  Rhythm: normal sinus rhythm  QRS Axis: normal  Intervals: normal  ST/T Wave abnormalities: normal  Conduction Disutrbances: none  Narrative Interpretation: unremarkable, 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  ED MD interpretation: CT scan shows no PE but does show bilateral lower lobe pneumonia.  Official radiology report(s): DG Chest 2 View  Result Date: 01/27/2021 CLINICAL DATA:  Provided history: Cough for 1 month. Additional  history provided: Patient reports cough with congestion and shortness of breath for the past month with "sweats." History of breast cancer and low white blood cell count. Additional history obtained from prior radiology records: History of lymphoma. EXAM: CHEST - 2 VIEW COMPARISON:  PET-CT 05/08/2020. CT angiogram chest 04/18/2020. Chest radiographs 04/17/2020. FINDINGS: Heart size within normal limits. Ill-defined opacity within the left lung base. No appreciable airspace consolidation within the right lung. No evidence of pleural effusion or pneumothorax. No acute bony abnormality identified. Surgical clips within the right upper quadrant of the abdomen. IMPRESSION: Ill-defined opacity within the left lung base, compatible with pneumonia in the appropriate clinical setting. Followup PA and lateral chest radiographs are recommended in 3-4 weeks following a trial of antibiotic therapy to ensure resolution and to exclude underlying malignancy. Electronically Signed   By: Kellie Simmering D.O.   On: 01/27/2021 19:22   CT Angio Chest PE W and/or Wo Contrast  Result Date: 01/28/2021 CLINICAL DATA:  Cough and congestion with shortness of breath. EXAM: CT ANGIOGRAPHY CHEST WITH CONTRAST TECHNIQUE: Multidetector CT imaging of the chest was performed using the standard protocol during bolus administration of intravenous contrast. Multiplanar CT image reconstructions and MIPs were obtained to evaluate the vascular anatomy. CONTRAST:  45mL OMNIPAQUE IOHEXOL 350 MG/ML SOLN COMPARISON:  04/18/2020 FINDINGS: Cardiovascular: Satisfactory opacification of the pulmonary arteries to the segmental level. No evidence of pulmonary embolism. Normal heart size. No pericardial effusion. Mediastinum/Nodes: Negative for adenopathy or mass. Lungs/Pleura: Patchy ground-glass opacities especially in the left upper lobe. Ground-glass density with superimposed streaky opacity and volume loss in the lower lungs. Pattern is not the same as seen  earlier this year to imply chronic airspace disease and alveolar lymphoma. No edema, effusion, or pneumothorax. Upper Abdomen: Cholecystectomy Musculoskeletal: No acute finding Review of the MIP images confirms the above findings. IMPRESSION: 1. Ground-glass pneumonia and lower lobe atelectasis. 2. Negative for pulmonary embolism. Electronically Signed   By: Jorje Guild M.D.   On: 01/28/2021 04:18    ____________________________________________   PROCEDURES  Procedure(s) performed (including Critical Care):  Procedures   ____________________________________________   INITIAL IMPRESSION / ASSESSMENT AND PLAN / ED COURSE  As part of my medical decision making, I reviewed the following data within the [place] notes reviewed and incorporated, Interpreter needed, Labs reviewed , EKG interpreted , Old EKG reviewed, Old chart reviewed, Radiograph reviewed , CT reviewed, and Notes from prior  ED visits         Patient here with complaints of subjective fevers, night sweats, cough, shortness of breath, chest pain for the past month.  Differential includes pneumonia, PE, pulmonary infarct, malignancy, tuberculosis, HIV, ACS, CHF.  EKG shows no ischemia.  Chest x-ray obtained from triage shows left lung base opacity that could represent pneumonia but also could be malignancy.  We will obtain CTA of the chest for further evaluation.  Labs show mild leukopenia which is chronic.  She is no longer receiving chemotherapy, last was about a year ago.  We will also obtain COVID and flu swab.  Troponin pending.  ED PROGRESS  Patient CT scan shows bilateral lower lobe pneumonia.  No PE.  No mass.  Will treat with Rocephin, azithromycin.  Troponin negative.  She has had a brief drop of blood pressure.  Will give IV fluids and obtain lactic and blood cultures.  Procalcitonin is pending.  We will also ambulate with pulse oximetry.  The patient is hypoxic or continues to be  hypotensive, will admit.  5:44 AM  Pt's lactic is normal.  Procalcitonin normal.  COVID and flu negative.  Sats 100% with ambulation.  Blood pressure improved.  Will discharge with prescriptions of cefdinir and azithromycin.  Will provide [employer]  Discussed supportive care instructions and return precautions.  She has a PCP for follow-up.   At this time, I do not feel there is any life-threatening condition present. I have reviewed, interpreted and discussed all results (EKG, imaging, lab, urine as appropriate) and exam findings with patient/family. I have reviewed nursing notes and appropriate previous records.  I feel the patient is safe to be discharged home without further emergent workup and can continue workup as an outpatient as needed. Discussed usual and customary return precautions. Patient/family verbalize understanding and are comfortable with this plan.  Outpatient follow-up has been provided as needed. All questions have been answered.  ____________________________________________   FINAL CLINICAL IMPRESSION(S) / ED DIAGNOSES  Final diagnoses:  Community acquired bilateral lower lobe pneumonia     ED Discharge Orders          Ordered    cefdinir (OMNICEF) 300 MG capsule  2 times daily        01/28/21 0543    azithromycin (ZITHROMAX Z-PAK) 250 MG tablet        01/28/21 0543    benzonatate (TESSALON PERLES) 100 MG capsule  3 times daily PRN        01/28/21 0543            *Please Contreras:  Kim Contreras was evaluated in Emergency Department on 01/28/2021 for the symptoms described in the history of present illness. She was evaluated in the context of the global COVID-19 pandemic, which necessitated consideration that the patient might be at risk for infection with the SARS-CoV-2 virus that causes COVID-19. Institutional protocols and algorithms that pertain to the evaluation of patients at risk for COVID-19 are in a state of rapid change based on information  released by regulatory bodies including the CDC and federal and state organizations. These policies and algorithms were followed during the patient's care in the ED.  Some ED evaluations and interventions may be delayed as a result of limited staffing during and the pandemic.*   Contreras:  This document was prepared using Dragon voice recognition software and may include unintentional dictation errors.    Siddarth Hsiung, Delice Bison, DO 01/28/21 [phone]

## 2021-01-28 NOTE — ED Notes (Signed)
Pt oxygen saturation maintained 100% while ambulating.

## 2021-01-28 NOTE — Discharge Instructions (Addendum)
You may alternate Tylenol 1000 mg every 6 hours as needed for pain, fever and Ibuprofen 800 mg every 8 hours as needed for pain, fever.  Please take Ibuprofen with food.  Do not take more than 4000 mg of Tylenol (acetaminophen) in a 24 hour period.   Puede alternar Tylenol 1000 mg cada 6 horas segn sea necesario para el dolor y la fiebre e ibuprofeno 800 mg cada 8 horas segn sea necesario para el dolor y la fiebre. Tome ibuprofeno con alimentos. No tome ms de 4000 mg de Tylenol (acetaminofn) en un perodo de 24 horas. 

## 2021-02-02 LAB — CULTURE, BLOOD (ROUTINE X 2)
Culture: NO GROWTH
Culture: NO GROWTH

## 2021-02-04 ENCOUNTER — Encounter: Payer: Self-pay | Admitting: *Deleted

## 2021-02-05 ENCOUNTER — Ambulatory Visit: Payer: Commercial Managed Care - PPO | Admitting: Certified Registered"

## 2021-02-05 ENCOUNTER — Other Ambulatory Visit: Payer: Self-pay

## 2021-02-05 ENCOUNTER — Ambulatory Visit
Admission: RE | Admit: 2021-02-05 | Discharge: 2021-02-05 | Disposition: A | Discharge status: Home / Self Care | Payer: Commercial Managed Care - PPO | Attending: Gastroenterology | Admitting: Gastroenterology

## 2021-02-05 ENCOUNTER — Encounter
Admission: RE | Disposition: A | Discharge status: Home / Self Care | Payer: Self-pay | Source: Home / Self Care | Attending: Gastroenterology

## 2021-02-05 ENCOUNTER — Encounter: Payer: Self-pay | Admitting: *Deleted

## 2021-02-05 DIAGNOSIS — Z1211 Encounter for screening for malignant neoplasm of colon: Secondary | ICD-10-CM | POA: Diagnosis not present

## 2021-02-05 DIAGNOSIS — F419 Anxiety disorder, unspecified: Secondary | ICD-10-CM | POA: Insufficient documentation

## 2021-02-05 DIAGNOSIS — K640 First degree hemorrhoids: Secondary | ICD-10-CM | POA: Insufficient documentation

## 2021-02-05 DIAGNOSIS — K5730 Diverticulosis of large intestine without perforation or abscess without bleeding: Secondary | ICD-10-CM | POA: Insufficient documentation

## 2021-02-05 DIAGNOSIS — R1011 Right upper quadrant pain: Secondary | ICD-10-CM | POA: Insufficient documentation

## 2021-02-05 DIAGNOSIS — Z8619 Personal history of other infectious and parasitic diseases: Secondary | ICD-10-CM | POA: Insufficient documentation

## 2021-02-05 DIAGNOSIS — K2950 Unspecified chronic gastritis without bleeding: Secondary | ICD-10-CM | POA: Diagnosis not present

## 2021-02-05 DIAGNOSIS — Z8572 Personal history of non-Hodgkin lymphomas: Secondary | ICD-10-CM | POA: Insufficient documentation

## 2021-02-05 DIAGNOSIS — K219 Gastro-esophageal reflux disease without esophagitis: Secondary | ICD-10-CM | POA: Insufficient documentation

## 2021-02-05 DIAGNOSIS — I10 Essential (primary) hypertension: Secondary | ICD-10-CM | POA: Insufficient documentation

## 2021-02-05 DIAGNOSIS — Z9049 Acquired absence of other specified parts of digestive tract: Secondary | ICD-10-CM | POA: Diagnosis not present

## 2021-02-05 DIAGNOSIS — M1990 Unspecified osteoarthritis, unspecified site: Secondary | ICD-10-CM | POA: Diagnosis not present

## 2021-02-05 HISTORY — PX: COLONOSCOPY WITH PROPOFOL: SHX5780

## 2021-02-05 HISTORY — PX: ESOPHAGOGASTRODUODENOSCOPY (EGD) WITH PROPOFOL: SHX5813

## 2021-02-05 HISTORY — DX: Diverticulosis of intestine, part unspecified, without perforation or abscess without bleeding: K57.90

## 2021-02-05 SURGERY — ESOPHAGOGASTRODUODENOSCOPY (EGD) WITH PROPOFOL
Anesthesia: General

## 2021-02-05 MED ORDER — PROPOFOL 10 MG/ML IV BOLUS
INTRAVENOUS | Status: DC | PRN
  Administered 2021-02-05: 60 mg via INTRAVENOUS

## 2021-02-05 MED ORDER — SODIUM CHLORIDE 0.9 % IV SOLN: INTRAVENOUS | Status: DC

## 2021-02-05 MED ORDER — GLYCOPYRROLATE 0.2 MG/ML IJ SOLN
INTRAMUSCULAR | Status: DC | PRN
  Administered 2021-02-05: .2 mg via INTRAVENOUS

## 2021-02-05 MED ORDER — PROPOFOL 500 MG/50ML IV EMUL
INTRAVENOUS | Status: DC | PRN
  Administered 2021-02-05: 150 ug/kg/min via INTRAVENOUS

## 2021-02-05 MED ORDER — LIDOCAINE HCL (CARDIAC) PF 100 MG/5ML IV SOSY
PREFILLED_SYRINGE | INTRAVENOUS | Status: DC | PRN
  Administered 2021-02-05: 100 mg via INTRAVENOUS

## 2021-02-05 NOTE — Interval H&P Note (Signed)
History and Physical Interval Note:  02/05/2021 12:39 PM  Kim Contreras  has presented today for surgery, with the diagnosis of GERD k21.9, dysphagia r13.10, ruq pain r10.11 hx of polyps z86.010.  The various methods of treatment have been discussed with the patient and family. After consideration of risks, benefits and other options for treatment, the patient has consented to  Procedure(s): ESOPHAGOGASTRODUODENOSCOPY (EGD) WITH PROPOFOL (N/A) COLONOSCOPY WITH PROPOFOL (N/A) as a surgical intervention.  The patient's history has been reviewed, patient examined, no change in status, stable for surgery.  I have reviewed the patient's chart and labs.  Questions were answered to the patient's satisfaction.     Lesly Rubenstein  Ok to proceed with EGD/Colonoscopy

## 2021-02-05 NOTE — H&P (Signed)
Outpatient short stay form Pre-procedure 02/05/2021  Kim Rubenstein, MD  Primary Physician: [facility]  Reason for visit:  Abdominal pain/History of polyp  History of present illness:   60 y/o lady with history of lymphoma, hypertension,  and GERD here for EGD/Colonoscopy for abdominal pain that resolved with PPI and colonoscopy for history of adenomatous polyp on colonoscopy performed 6 years ago. No blood thinners. No family history of GI malignancies. History of appendectomy and history of cholecystectomy.    Current Facility-Administered Medications:    0.9 %  sodium chloride infusion, , Intravenous, Continuous, Makyra Corprew, Hilton Cork, MD, Last Rate: 20 mL/hr at 02/05/21 1234, New Bag at 02/05/21 1234  Facility-Administered Medications Ordered in Other Encounters:    sodium chloride flush (NS) 0.9 % injection 10 mL, 10 mL, Intravenous, PRN, Charlaine Dalton R, MD, 10 mL at 12/28/19 0815  Medications Prior to Admission  Medication Sig Dispense Refill Last Dose   esomeprazole (NEXIUM) 40 MG capsule Take 40 mg by mouth daily at 12 noon.   02/04/2021   losartan (COZAAR) 50 MG tablet Take 50 mg by mouth daily.   02/05/2021   sucralfate (CARAFATE) 1 g tablet Take 1 g by mouth 4 (four) times daily -  with meals and at bedtime.      benzonatate (TESSALON PERLES) 100 MG capsule Take 1 capsule (100 mg total) by mouth 3 (three) times daily as needed for cough. (Patient not taking: Reported on 02/04/2021) 30 capsule 0 Not Taking   cefdinir (OMNICEF) 300 MG capsule Take 1 capsule (300 mg total) by mouth 2 (two) times daily. (Patient not taking: Reported on 02/04/2021) 14 capsule 0 Completed Course   gabapentin (NEURONTIN) 100 MG capsule Take 100 mg by mouth daily.      levocetirizine (XYZAL) 5 MG tablet Take 5 mg by mouth every evening.      mirabegron ER (MYRBETRIQ) 25 MG TB24 tablet Take 1 tablet (25 mg total) by mouth daily. 30 tablet 11    montelukast (SINGULAIR) 10  MG tablet Take 10 mg by mouth every evening.      pantoprazole (PROTONIX) 40 MG tablet Take 1 tablet (40 mg total) by mouth daily. 30 tablet 3      Allergies  Allergen Reactions   Chlorhexidine     Not required   Pseudoephedrine Hcl Other (See Comments)     Past Medical History:  Diagnosis Date   Anemia 2006   Anxiety    Arthritis    Diverticulosis    Helicobacter pylori gastritis    History of hiatal hernia    Hypertension    Lymphoma ([place])     Review of systems:  Otherwise negative.    Physical Exam  Gen: Alert, oriented. Appears stated age.  HEENT: PERRLA. Lungs: No respiratory distress CV: RRR Abd: soft, benign, no masses Ext: No edema    Planned procedures: Proceed with EGD/colonoscopy. The patient understands the nature of the planned procedure, indications, risks, alternatives and potential complications including but not limited to bleeding, infection, perforation, damage to internal organs and possible oversedation/side effects from anesthesia. The patient agrees and gives consent to proceed.  Please refer to procedure notes for findings, recommendations and patient disposition/instructions.     Kim Rubenstein, MD [facility]

## 2021-02-05 NOTE — Anesthesia Procedure Notes (Signed)
Procedure Name: General with mask airway Date/Time: 02/05/2021 12:48 PM Performed by: Kelton Pillar, CRNA Pre-anesthesia Checklist: Patient identified, Emergency Drugs available, Suction available and Patient being monitored Oxygen Delivery Method: Simple face mask Induction Type: IV induction Placement Confirmation: positive ETCO2 and CO2 detector Dental Injury: Teeth and Oropharynx as per pre-operative assessment

## 2021-02-05 NOTE — Transfer of Care (Signed)
Immediate Anesthesia Transfer of Care Note  Patient: Kim Contreras  Procedure(s) Performed: ESOPHAGOGASTRODUODENOSCOPY (EGD) WITH PROPOFOL COLONOSCOPY WITH PROPOFOL  Patient Location: Endoscopy Unit  Anesthesia Type:General  Level of Consciousness: drowsy and patient cooperative  Airway & Oxygen Therapy: Patient Spontanous Breathing and Patient connected to face mask oxygen  Post-op Assessment: Report given to RN and Post -op Vital signs reviewed and stable  Post vital signs: Reviewed and stable  Last Vitals:  Vitals Value Taken Time  BP 87/46 02/05/21 1310  Temp    Pulse 70 02/05/21 1310  Resp 23 02/05/21 1310  SpO2 100 % 02/05/21 1310    Last Pain:  Vitals:   02/05/21 1310  TempSrc:   PainSc: Asleep         Complications: No notable events documented.

## 2021-02-05 NOTE — Op Note (Signed)
[facility] Gastroenterology Patient Name: Kim Contreras Procedure Date: 02/05/2021 11:47 AM MRN: [id number] Account #: [id number] Date of Birth: [birth year] Admit Type: Outpatient Age: 60 Room: [facility] ENDO ROOM 1 Gender: Female Note Status: Finalized Instrument Name: Jasper Riling [id number] Procedure:             Colonoscopy Indications:           Surveillance: Personal history of adenomatous polyps                         on last colonoscopy > 5 years ago Providers:             Andrey Farmer MD, MD Referring MD:          Bridget Hartshorn j. [facility], dr (Referring MD) Medicines:             Monitored Anesthesia Care Complications:         No immediate complications. Procedure:             Pre-Anesthesia Assessment:                        - Prior to the procedure, a History and Physical was                         performed, and patient medications and allergies were                         reviewed. The patient is competent. The risks and                         benefits of the procedure and the sedation options and                         risks were discussed with the patient. All questions                         were answered and informed consent was obtained.                         Patient identification and proposed procedure were                         verified by the physician, the nurse, the anesthetist                         and the technician in the endoscopy suite. Mental                         Status Examination: alert and oriented. Airway                         Examination: normal oropharyngeal airway and neck                         mobility. Respiratory Examination: clear to                         auscultation. CV Examination: normal. Prophylactic  Antibiotics: The patient does not require prophylactic                         antibiotics. Prior Anticoagulants: The patient has                         taken no previous  anticoagulant or antiplatelet                         agents. ASA Grade Assessment: II - A patient with mild                         systemic disease. After reviewing the risks and                         benefits, the patient was deemed in satisfactory                         condition to undergo the procedure. The anesthesia                         plan was to use monitored anesthesia care (MAC).                         Immediately prior to administration of medications,                         the patient was re-assessed for adequacy to receive                         sedatives. The heart rate, respiratory rate, oxygen                         saturations, blood pressure, adequacy of pulmonary                         ventilation, and response to care were monitored                         throughout the procedure. The physical status of the                         patient was re-assessed after the procedure.                        After obtaining informed consent, the colonoscope was                         passed under direct vision. Throughout the procedure,                         the patient's blood pressure, pulse, and oxygen                         saturations were monitored continuously. The                         Colonoscope was introduced through the anus and  advanced to the the cecum, identified by appendiceal                         orifice and ileocecal valve. The colonoscopy was                         performed without difficulty. The patient tolerated                         the procedure well. The quality of the bowel                         preparation was good. Findings:      The perianal and digital rectal examinations were normal.      A few small-mouthed diverticula were found in the sigmoid colon.      Internal hemorrhoids were found during retroflexion. The hemorrhoids       were Grade I (internal hemorrhoids that do not prolapse).       The exam was otherwise without abnormality on direct and retroflexion       views. Impression:            - Diverticulosis in the sigmoid colon.                        - Internal hemorrhoids.                        - The examination was otherwise normal on direct and                         retroflexion views.                        - No specimens collected. Recommendation:        - Discharge patient to home.                        - Resume previous diet.                        - Continue present medications.                        - Repeat colonoscopy in 10 years for surveillance.                        - Return to referring physician as previously                         scheduled. Procedure Code(s):     --- Professional ---                        P9509, Colorectal cancer screening; colonoscopy on                         individual at high risk Diagnosis Code(s):     --- Professional ---                        Z86.010, Personal history of colonic polyps  K64.0, First degree hemorrhoids                        K57.30, Diverticulosis of large intestine without                         perforation or abscess without bleeding CPT copyright 2019 [employer]. All rights reserved. The codes documented in this report are preliminary and upon coder review may  be revised to meet current compliance requirements. Andrey Farmer MD, MD 02/05/2021 1:12:53 PM Number of Addenda: 0 Note Initiated On: 02/05/2021 11:47 AM Scope Withdrawal Time: 0 hours 7 minutes 10 seconds  Total Procedure Duration: 0 hours 14 minutes 26 seconds  Estimated Blood Loss:  Estimated blood loss: none.      [facility]

## 2021-02-05 NOTE — Op Note (Signed)
[facility] Gastroenterology Patient Name: Kim Contreras Procedure Date: 02/05/2021 11:47 AM MRN: [id number] Account #: [id number] Date of Birth: Oct 11, 1960 Admit Type: Outpatient Age: 60 Room: [facility] ENDO ROOM 1 Gender: Female Note Status: Finalized Instrument Name: Upper Endoscope [id number] Procedure:             Upper GI endoscopy Indications:           Abdominal pain in the right upper quadrant,                         Gastro-esophageal reflux disease Providers:             Andrey Farmer MD, MD Referring MD:          Bridget Hartshorn j. [facility], dr (Referring MD) Medicines:             Monitored Anesthesia Care Complications:         No immediate complications. Estimated blood loss:                         Minimal. Procedure:             Pre-Anesthesia Assessment:                        - Prior to the procedure, a History and Physical was                         performed, and patient medications and allergies were                         reviewed. The patient is competent. The risks and                         benefits of the procedure and the sedation options and                         risks were discussed with the patient. All questions                         were answered and informed consent was obtained.                         Patient identification and proposed procedure were                         verified by the physician, the nurse, the anesthetist                         and the technician in the endoscopy suite. Mental                         Status Examination: alert and oriented. Airway                         Examination: normal oropharyngeal airway and neck                         mobility. Respiratory Examination: clear to  auscultation. CV Examination: normal. Prophylactic                         Antibiotics: The patient does not require prophylactic                         antibiotics. Prior Anticoagulants: The  patient has                         taken no previous anticoagulant or antiplatelet                         agents. ASA Grade Assessment: II - A patient with mild                         systemic disease. After reviewing the risks and                         benefits, the patient was deemed in satisfactory                         condition to undergo the procedure. The anesthesia                         plan was to use monitored anesthesia care (MAC).                         Immediately prior to administration of medications,                         the patient was re-assessed for adequacy to receive                         sedatives. The heart rate, respiratory rate, oxygen                         saturations, blood pressure, adequacy of pulmonary                         ventilation, and response to care were monitored                         throughout the procedure. The physical status of the                         patient was re-assessed after the procedure.                        After obtaining informed consent, the endoscope was                         passed under direct vision. Throughout the procedure,                         the patient's blood pressure, pulse, and oxygen                         saturations were monitored continuously. The Endoscope  was introduced through the mouth, and advanced to the                         second part of duodenum. The upper GI endoscopy was                         accomplished without difficulty. The patient tolerated                         the procedure well. Findings:      The examined esophagus was normal.      The entire examined stomach was normal. Biopsies were taken with a cold       forceps for histology. Estimated blood loss was minimal.      The examined duodenum was normal. Impression:            - Normal esophagus.                        - Normal stomach. Biopsied.                        - Normal  examined duodenum. Recommendation:        - Perform a colonoscopy today. Procedure Code(s):     --- Professional ---                        [phone], Esophagogastroduodenoscopy, flexible,                         transoral; with biopsy, single or multiple Diagnosis Code(s):     --- Professional ---                        R10.11, Right upper quadrant pain                        K21.9, Gastro-esophageal reflux disease without                         esophagitis CPT copyright 2019 [employer] Association. All rights reserved. The codes documented in this report are preliminary and upon coder review may  be revised to meet current compliance requirements. Andrey Farmer MD, MD 02/05/2021 1:10:19 PM Number of Addenda: 0 Note Initiated On: 02/05/2021 11:47 AM Estimated Blood Loss:  Estimated blood loss was minimal.      [facility]

## 2021-02-05 NOTE — Anesthesia Preprocedure Evaluation (Signed)
Anesthesia Evaluation  Patient identified by MRN, date of birth, ID band Patient awake    Reviewed: Allergy & Precautions, NPO status , Patient's Chart, lab work & pertinent test results  History of Anesthesia Complications Negative for: history of anesthetic complications  Airway Mallampati: II  TM Distance: >3 FB Neck ROM: Full    Dental no notable dental hx. (+) Teeth Intact   Pulmonary neg pulmonary ROS, neg sleep apnea, neg COPD, Patient abstained from smoking.Not current smoker,    Pulmonary exam normal breath sounds clear to auscultation       Cardiovascular Exercise Tolerance: Good METShypertension, (-) CAD and (-) Past MI (-) dysrhythmias  Rhythm:Regular Rate:Normal - Systolic murmurs    Neuro/Psych PSYCHIATRIC DISORDERS Anxiety negative neurological ROS     GI/Hepatic hiatal hernia, neg GERD  ,(+)     (-) substance abuse  ,   Endo/Other  neg diabetes  Renal/GU negative Renal ROS     Musculoskeletal   Abdominal   Peds  Hematology   Anesthesia Other Findings Past Medical History: 2006: Anemia No date: Anxiety No date: Arthritis No date: Diverticulosis No date: Helicobacter pylori gastritis No date: History of hiatal hernia No date: Hypertension No date: Lymphoma ([place])  Reproductive/Obstetrics                             Anesthesia Physical Anesthesia Plan  ASA: 2  Anesthesia Plan: General   Post-op Pain Management:    Induction: Intravenous  PONV Risk Score and Plan: 3 and Ondansetron, Propofol infusion and TIVA  Airway Management Planned: Nasal Cannula  Additional Equipment: None  Intra-op Plan:   Post-operative Plan:   Informed Consent: I have reviewed the patients History and Physical, chart, labs and discussed the procedure including the risks, benefits and alternatives for the proposed anesthesia with the patient or authorized representative who has  indicated his/her understanding and acceptance.     Dental advisory given  Plan Discussed with: CRNA and Surgeon  Anesthesia Plan Comments: (Discussed risks of anesthesia with patient, including possibility of difficulty with spontaneous ventilation under anesthesia necessitating airway intervention, PONV, and rare risks such as cardiac or respiratory or neurological events, and allergic reactions. Discussed the role of CRNA in patient's perioperative care. Patient understands.)        Anesthesia Quick Evaluation

## 2021-02-06 ENCOUNTER — Encounter: Payer: Self-pay | Admitting: Gastroenterology

## 2021-02-06 LAB — SURGICAL PATHOLOGY

## 2021-02-07 ENCOUNTER — Encounter: Payer: Self-pay | Admitting: Internal Medicine

## 2021-02-07 ENCOUNTER — Inpatient Hospital Stay: Payer: Commercial Managed Care - PPO | Admitting: Internal Medicine

## 2021-02-07 ENCOUNTER — Other Ambulatory Visit: Payer: Self-pay

## 2021-02-07 ENCOUNTER — Inpatient Hospital Stay: Payer: Commercial Managed Care - PPO | Attending: Internal Medicine

## 2021-02-07 DIAGNOSIS — C8238 Follicular lymphoma grade IIIa, lymph nodes of multiple sites: Secondary | ICD-10-CM

## 2021-02-07 DIAGNOSIS — D709 Neutropenia, unspecified: Secondary | ICD-10-CM | POA: Diagnosis not present

## 2021-02-07 LAB — CBC WITH DIFFERENTIAL/PLATELET
Abs Immature Granulocytes: 0.02 10*3/uL (ref 0.00–0.07)
Basophils Absolute: 0 10*3/uL (ref 0.0–0.1)
Basophils Relative: 0 %
Eosinophils Absolute: 0.1 10*3/uL (ref 0.0–0.5)
Eosinophils Relative: 3 %
HCT: 34.2 % — ABNORMAL LOW (ref 36.0–46.0)
Hemoglobin: 11.4 g/dL — ABNORMAL LOW (ref 12.0–15.0)
Immature Granulocytes: 1 %
Lymphocytes Relative: 34 %
Lymphs Abs: 0.9 10*3/uL (ref 0.7–4.0)
MCH: 31.1 pg (ref 26.0–34.0)
MCHC: 33.3 g/dL (ref 30.0–36.0)
MCV: 93.2 fL (ref 80.0–100.0)
Monocytes Absolute: 0.4 10*3/uL (ref 0.1–1.0)
Monocytes Relative: 14 %
Neutro Abs: 1.3 10*3/uL — ABNORMAL LOW (ref 1.7–7.7)
Neutrophils Relative %: 48 %
Platelets: 341 10*3/uL (ref 150–400)
RBC: 3.67 MIL/uL — ABNORMAL LOW (ref 3.87–5.11)
RDW: 13.6 % (ref 11.5–15.5)
WBC: 2.7 10*3/uL — ABNORMAL LOW (ref 4.0–10.5)
nRBC: 0 % (ref 0.0–0.2)

## 2021-02-07 LAB — COMPREHENSIVE METABOLIC PANEL
ALT: 22 U/L (ref 0–44)
AST: 17 U/L (ref 15–41)
Albumin: 3.6 g/dL (ref 3.5–5.0)
Alkaline Phosphatase: 130 U/L — ABNORMAL HIGH (ref 38–126)
Anion gap: 9 (ref 5–15)
BUN: 9 mg/dL (ref 6–20)
CO2: 25 mmol/L (ref 22–32)
Calcium: 8.8 mg/dL — ABNORMAL LOW (ref 8.9–10.3)
Chloride: 105 mmol/L (ref 98–111)
Creatinine, Ser: 0.69 mg/dL (ref 0.44–1.00)
GFR, Estimated: >60 mL/min (ref >60)
Glucose, Bld: 103 mg/dL — ABNORMAL HIGH (ref 70–99)
Potassium: 3.8 mmol/L (ref 3.5–5.1)
Sodium: 139 mmol/L (ref 135–145)
Total Bilirubin: 0.4 mg/dL (ref 0.3–1.2)
Total Protein: 7.3 g/dL (ref 6.5–8.1)

## 2021-02-07 LAB — LACTATE DEHYDROGENASE: LDH: 139 U/L (ref 98–192)

## 2021-02-07 NOTE — Progress Notes (Signed)
[name], [name], [name], [name], 2022-treated disease noted to have any progression.  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 ([place])   07/11/2019 -  Chemotherapy   The patient had dexamethasone (DECADRON) 4 MG tablet, 8 mg, Oral, Daily, 1 of 1 cycle, Start date: --, [name], 0.25 mg, Intravenous,  Once, 6 of 6 cycles Administration: 0.25 mg (07/11/2019), 0.25 mg (08/22/2019), 0.25 mg (09/22/2019), 0.25 mg (11/28/2019), 0.25 mg (01/03/2020) pegfilgrastim-cbqv (UDENYCA) injection 6 mg, 6 mg, Subcutaneous, Once, 3 of 3 cycles Administration: 6 mg (11/30/2019), 6 mg  (01/05/2020), 6 mg (02/13/2020) bendamustine (BENDEKA) 150 mg in sodium chloride 0.9 % 50 mL (2.6786 mg/mL) chemo infusion, 90 mg/m2 = 150 mg, Intravenous,  Once, [name]50 mg (07/11/2019), 150 mg (07/12/2019), 150 mg (08/22/2019), 150 mg (08/24/2019), 150 mg (09/22/2019), 150 mg (09/23/2019), 150 mg (11/28/2019), 150 mg (11/29/2019), 150 mg (01/03/2020), 150 mg (01/04/2020), 150 mg (02/10/2020) obinutuzumab (GAZYVA) 1,[name], 1,000 mg, Intravenous, Once, [name],000 mg (07/11/2019), 1,000 mg (07/18/2019), 1,000 mg (08/22/2019), 1,000 mg (07/27/2019), 1,000 mg (09/22/2019), 1,000 mg (11/28/2019), 1,000 mg (01/03/2020)   for chemotherapy treatment.        HISTORY OF PRESENTING ILLNESS: Patient speaks minimal English/patient daughter Kim Contreras in office.  Kim Contreras 60 y.o.  female with follicular lymphoma grade 3 status post Gazyva-Benda currently on surveillance is here for follow-up.  In the interim patient was seen in emergency room for worsening shortness of breath or cough-October 2022 CTA-no lymphadenopathy no PE but positive for pneumonia.  She continues to complain of chronic fatigue.    Review of Systems  Constitutional:  Positive for malaise/fatigue. Negative for chills, diaphoresis, [name], hemoptysis, sputum production, [name], palpitations, [name], diarrhea, heartburn, melena and vomiting.  Musculoskeletal:  Positive for back pain and joint pain.  Skin: Negative.  Negative for itching and rash.  Neurological:  Positive for tingling. Negative for dizziness, focal weakness and weakness.   Endo/Heme/Allergies:  Does not bruise/bleed easily.  Psychiatric/Behavioral:  Negative for depression. The patient is not nervous/anxious.     MEDICAL HISTORY:  Past Medical History:  Diagnosis Date   Anemia 2006   Anxiety    Arthritis    Diverticulosis    Helicobacter pylori gastritis    History of hiatal hernia    Hypertension    Lymphoma ([place])     SURGICAL HISTORY: Past Surgical History:  Procedure Laterality Date   APPENDECTOMY     CHOLECYSTECTOMY N/A 01/19/2015   Procedure: LAPAROSCOPIC CHOLECYSTECTOMY;  [name], MD;  [name];  Service: General;  [name];   COLONOSCOPY WITH PROPOFOL N/A 10/27/2014   Procedure: COLONOSCOPY WITH PROPOFOL;  [name], MD;  [name];  Service: Endoscopy;  [name];   COLONOSCOPY WITH PROPOFOL N/A 02/05/2021   Procedure: COLONOSCOPY WITH PROPOFOL;  [name], MD;  [name];  Service: Endoscopy;  [name];   ESOPHAGOGASTRODUODENOSCOPY     ESOPHAGOGASTRODUODENOSCOPY (EGD) WITH PROPOFOL N/A 02/05/2021   Procedure: ESOPHAGOGASTRODUODENOSCOPY (EGD) WITH PROPOFOL;  [name], MD;  [name];  Service: Endoscopy;  [name];   EXCISION MASS ABDOMINAL N/A 06/03/2019   Procedure: EXCISION MASS ABDOMINAL;  [name], MD;  [name];  Service: General;  [name];  abdominal node biopsy   NASAL SINUS SURGERY     PORTACATH PLACEMENT Left 07/08/2019   Procedure: INSERTION PORT-A-CATH;  [name], MD;  [name];  Service: General;  Laterality: Left;   TUBAL LIGATION      SOCIAL HISTORY: Social History   Socioeconomic History   Marital status: Married    Spouse name: Not on file   Number of children: Not on file   Years of education: Not on file   Highest education level: Not on file  Occupational History   Not on file  Tobacco Use    Smoking status: Never   Smokeless tobacco: Never  Vaping Use   Vaping Use: Never used  Substance and Sexual Activity   Alcohol use: No   Drug use: No   Sexual activity: Not on file  Other Topics Concern   Not on file  Social History Narrative   Not on file   Social Determinants of Health   Financial Resource Strain: Not on file  Food Insecurity: Not on file  Transportation Needs: Not on file  Physical Activity: Not on file  Stress: Not on file  Social Connections: Not on file  Intimate Partner Violence: Not on file    FAMILY HISTORY: Family History  Problem Relation Age of Onset   Breast cancer Other 57   Prostate cancer Neg Hx    Bladder Cancer Neg Hx    Kidney cancer Neg Hx     ALLERGIES:  is allergic to chlorhexidine and pseudoephedrine hcl.  MEDICATIONS:  Current Outpatient Medications  Medication Sig Dispense Refill   levocetirizine (XYZAL) 5 MG tablet Take 5 mg by mouth every evening.     losartan (COZAAR) 50 MG tablet Take 50 mg by mouth daily.     mirabegron ER (MYRBETRIQ) 25 MG TB24 tablet Take 1 tablet (25 mg total) by mouth daily. 30 tablet 11   montelukast (SINGULAIR) 10 MG tablet Take 10 mg by mouth every evening.     sucralfate (CARAFATE) 1 g tablet Take  1 g by mouth 4 (four) times daily -  with meals and at bedtime.     benzonatate (TESSALON PERLES) 100 MG capsule Take 1 capsule (100 mg total) by mouth 3 (three) times daily as needed for cough. (Patient not taking: No sig reported) 30 capsule 0   cefdinir (OMNICEF) 300 MG capsule Take 1 capsule (300 mg total) by mouth 2 (two) times daily. (Patient not taking: No sig reported) 14 capsule 0   esomeprazole (NEXIUM) 40 MG capsule Take 40 mg by mouth daily at 12 noon. (Patient not taking: Reported on 02/07/2021)     gabapentin (NEURONTIN) 100 MG capsule Take 100 mg by mouth daily. (Patient not taking: Reported on 02/07/2021)     pantoprazole (PROTONIX) 40 MG tablet Take 1 tablet (40 mg total) by mouth daily.  (Patient not taking: Reported on 02/07/2021) 30 tablet 3   No current facility-administered medications for this visit.   Facility-Administered Medications Ordered in Other Visits  Medication Dose Route Frequency Provider Last Rate Last Admin   sodium chloride flush (NS) 0.9 % injection 10 mL  10 mL Intravenous PRN [name], MD   10 mL at 12/28/19 0815      .  PHYSICAL EXAMINATION: ECOG PERFORMANCE STATUS: 0 - Asymptomatic  Vitals:   02/07/21 1006  BP: 122/78  Pulse: 67  Resp: 18  Temp: 97.6 F (36.4 C)  SpO2: 100%   Filed Weights   02/07/21 1006  Weight: 134 lb 8 oz (61 kg)    Physical Exam HENT:     Head: Normocephalic and atraumatic.     Mouth/Throat:     Pharynx: No oropharyngeal exudate.  Eyes:     Pupils: Pupils are equal, round, and reactive to light.  Cardiovascular:     Rate and Rhythm: Normal rate and regular rhythm.  Pulmonary:     Effort: Pulmonary effort is normal. No respiratory distress.     Breath sounds: Normal breath sounds. No wheezing.  Abdominal:     General: Bowel sounds are normal. There is no distension.     Palpations: Abdomen is soft. There is no mass.     Tenderness: There is no abdominal tenderness. There is no guarding or rebound.  Musculoskeletal:        General: No tenderness. Normal range of motion.     Cervical back: Normal range of motion and neck supple.     Comments: Mild swelling of the left lower extremity; mild tenderness noted.  Skin:    General: Skin is warm.  Neurological:     Mental Status: She is alert and oriented to person, place, and time.  Psychiatric:        Mood and Affect: Affect normal.     LABORATORY DATA:  I have reviewed the data as listed Lab Results  Component Value Date   WBC 2.7 (L) 02/07/2021   HGB 11.4 (L) 02/07/2021   HCT 34.2 (L) 02/07/2021   MCV 93.2 02/07/2021   PLT 341 02/07/2021   Recent Labs    11/08/20 0938 01/27/21 1857 02/07/21 0952  NA 138 137 139  K 4.3 3.8  3.8  CL 103 101 105  CO2 26 26 25   GLUCOSE 104* 104* 103*  BUN 17 11 9   CREATININE 0.85 0.68 0.69  CALCIUM 9.3 8.6* 8.8*  GFRNONAA >60 >60 >60  PROT 7.8 7.3 7.3  ALBUMIN 4.3 3.6 3.6  AST 21 29 17   ALT 21 31 22   ALKPHOS 157* 179* 130*  BILITOT 0.5 0.5 0.4    RADIOGRAPHIC STUDIES: I have personally reviewed the radiological images as listed and agreed with the findings in the report. DG Chest 2 View  Result Date: 02/19/2021 CLINICAL DATA:  Status post recent pneumonia treatment EXAM: CHEST - 2 VIEW COMPARISON:  01/27/2021 FINDINGS: Cardiac shadow is within normal limits. The lungs are well aerated bilaterally. Previously seen basilar opacity on the left has resolved in the interval. No new focal abnormality is seen. No bony abnormality is noted. IMPRESSION: Resolution of previously seen left basilar infiltrate. Electronically Signed   By: Inez Catalina M.D.   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 S/p Obi-benda x6 cylces-FEB 8th, 2022-   No findings suspicious for residual or recurrent lymphoma;  Regressing matted soft tissue density in the mesentery and retroperitoneum consistent with treated disease.  Clinically stable.  We will consider imaging again /next visit.  #Neutropenia /infection -OCT 2022-hospitalization pneumonia-CTA bilateral pneumonia-s/p antibiotics improved.  ANC 1.3 monitor for now.  We will check immunoglobulin levels.  #Right submandibular lymph node-1 to 2 cm monitor for now.  # chronic pain right quadrant abdmonal pain/chornic- EGD/Colo [NOV 2022- ] in nov 2022 [2016-colo]- STABLE.   # Vaccination:2022- s/p Flu shot; s/p COVID; defer to PCP re: pneumonia vaccination  # DISPOSITION: # Follow up in 4 months-- ;MD; labs- cbc/cmp;LDH;quantitative immunogobin Dr.B      All questions were answered. The  patient knows to call the clinic with any problems, questions or concerns.    [name], MD 03/01/2021 3:11 PM

## 2021-02-07 NOTE — Anesthesia Postprocedure Evaluation (Signed)
Anesthesia Post Note  Patient: Kim Contreras  Procedure(s) Performed: ESOPHAGOGASTRODUODENOSCOPY (EGD) WITH PROPOFOL COLONOSCOPY WITH PROPOFOL  Patient location during evaluation: PACU Anesthesia Type: General Level of consciousness: awake and alert Pain management: pain level controlled Vital Signs Assessment: post-procedure vital signs reviewed and stable Respiratory status: spontaneous breathing, nonlabored ventilation, respiratory function stable and patient connected to nasal cannula oxygen Cardiovascular status: blood pressure returned to baseline and stable Postop Assessment: no apparent nausea or vomiting Anesthetic complications: no   No notable events documented.   Last Vitals:  Vitals:   02/05/21 1330 02/05/21 1340  BP: 114/61   Pulse: 64 (!) 59  Resp: (!) 31 (!) 22  Temp:    SpO2: 100% 98%    Last Pain:  Vitals:   02/05/21 1340  TempSrc:   PainSc: 0-No pain                 Molli Barrows

## 2021-02-07 NOTE — Assessment & Plan Note (Addendum)
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 S/p Obi-benda x6 cylces-FEB 8th, 2022-   No findings suspicious for residual or recurrent lymphoma;  Regressing matted soft tissue density in the mesentery and retroperitoneum consistent with treated disease.  Clinically stable.  We will consider imaging again /next visit.  #Neutropenia /infection -OCT 2022-hospitalization pneumonia-CTA bilateral pneumonia-s/p antibiotics improved.  ANC 1.3 monitor for now.  We will check immunoglobulin levels.  #Right submandibular lymph node-1 to 2 cm monitor for now.  # chronic pain right quadrant abdmonal pain/chornic- EGD/Colo [NOV 2022- ] in nov 2022 [2016-colo]- STABLE.   # Vaccination:2022- s/p Flu shot; s/p COVID; defer to PCP re: pneumonia vaccination  # DISPOSITION: # Follow up in 4 months-- ;MD; labs- cbc/cmp;LDH;quantitative immunogobin Dr.B

## 2021-02-19 ENCOUNTER — Other Ambulatory Visit: Payer: Self-pay | Admitting: Primary Care

## 2021-02-19 ENCOUNTER — Ambulatory Visit
Admission: RE | Admit: 2021-02-19 | Discharge: 2021-02-19 | Disposition: A | Discharge status: Home / Self Care | Payer: Commercial Managed Care - PPO | Source: Ambulatory Visit | Attending: Primary Care | Admitting: Primary Care

## 2021-02-19 ENCOUNTER — Telehealth: Payer: Self-pay | Admitting: *Deleted

## 2021-02-19 ENCOUNTER — Other Ambulatory Visit: Payer: Self-pay

## 2021-02-19 ENCOUNTER — Ambulatory Visit
Admission: RE | Admit: 2021-02-19 | Discharge: 2021-02-19 | Disposition: A | Discharge status: Home / Self Care | Payer: Commercial Managed Care - PPO | Attending: Primary Care | Admitting: Primary Care

## 2021-02-19 DIAGNOSIS — C8598 Non-Hodgkin lymphoma, unspecified, lymph nodes of multiple sites: Secondary | ICD-10-CM

## 2021-02-19 DIAGNOSIS — J189 Pneumonia, unspecified organism: Secondary | ICD-10-CM | POA: Diagnosis not present

## 2021-02-19 NOTE — Telephone Encounter (Signed)
Patient does not speak english well and needs an interpreter. I told her a would call her back tomorrow with an interpreter. She said ok, thank you

## 2021-02-19 NOTE — Telephone Encounter (Signed)
Patient called Kim Contreras asking them why her blood counts are low if it is from the cancer or the treatments, what can be done about it and also that she wants her march appointment moved up. Nicole Kindred called me and asks that our office return her call [phone]. She was just seen 02/07/21  CBC with Differential Order: [id number] Status: Final result    Visible to patient: Yes (not seen)    Next appt: 06/06/2021 at 10:00 AM in Oncology ([facility])    Dx: Grade 3a follicular lymphoma of lymph...    0 Result Notes Component Ref Range & Units 12 d ago  (02/07/21) 3 wk ago  (01/27/21) 3 mo ago  (11/08/20) 6 mo ago  (08/09/20) 9 mo ago  (05/10/20) 10 mo ago  (04/20/20) 10 mo ago  (04/19/20)  WBC 4.0 - 10.5 K/uL 2.7 Low   3.3 Low   3.9 Low   2.9 Low   2.2 Low   15.6 High   7.2   RBC 3.87 - 5.11 MIL/uL 3.67 Low   3.64 Low   3.82 Low   3.78 Low   3.29 Low   3.55 Low   3.37 Low    Hemoglobin 12.0 - 15.0 g/dL 11.4 Low   11.7 Low   12.1  12.2  11.2 Low   12.2  11.4 Low    HCT 36.0 - 46.0 % 34.2 Low   33.9 Low   36.8  36.2  32.3 Low   34.2 Low   33.2 Low    MCV 80.0 - 100.0 fL 93.2  93.1  96.3  95.8  98.2  96.3  98.5   MCH 26.0 - 34.0 pg 31.1  32.1  31.7  32.3  34.0  34.4 High   33.8   MCHC 30.0 - 36.0 g/dL 33.3  34.5  32.9  33.7  34.7  35.7  34.3   RDW 11.5 - 15.5 % 13.6  13.4  13.7  12.4  13.9  13.0  13.1   Platelets 150 - 400 K/uL 341  355  322  280  294  164  146 Low    nRBC 0.0 - 0.2 % 0.0  0.0  0.0  0.0  0.0  0.0  0.0   Neutrophils Relative % % 48  55  63  67  49  77  86   Neutro Abs 1.7 - 7.7 K/uL 1.3 Low   1.9  2.5  1.9  1.1 Low   11.9 High   6.2   Lymphocytes Relative % 34  26  19  17  25  3  4    Lymphs Abs 0.7 - 4.0 K/uL 0.9  0.9  0.7  0.5 Low   0.6 Low   0.4 Low   0.3 Low    Monocytes Relative % 14  15  15  14  20  4  7    Monocytes Absolute 0.1 - 1.0 K/uL 0.4  0.5  0.6  0.4  0.4  0.7  0.5   Eosinophils Relative % 3  2  2  2  4   0  0   Eosinophils Absolute 0.0 - 0.5 K/uL 0.1  0.1  0.1   0.1  0.1  0.0  0.0   Basophils Relative % 0  1  1  0  1  0  0   Basophils Absolute 0.0 - 0.1 K/uL 0.0  0.0  0.0  0.0  0.0  0.1  0.0  Immature Granulocytes % 1  1  0  0  1  16  3    Abs Immature Granulocytes 0.00 - 0.07 K/uL 0.02  0.02 CM  0.01 CM  0.01 CM  0.01 CM  2.55 High  CM  0.24 High  CM   Comment: Performed at [facility], [address] [place], [place] [address]  WBC Morphology       INCREASED BANDS (>20% BANDS)  MILD LEFT SHIFT (1-5% METAS, OCC MYELO, OCC BANDS)   RBC Morphology       MORPHOLOGY UNREMARKABLE  MORPHOLOGY UNREMARKABLE   Smear Review       Normal platelet morphology  Normal platelet morphology   Resulting Agency  [place] [facility] [place] [facility] [place] [facility] [place] [facility] [place] [facility] [place] [facility] [place] [facility]         Specimen Collected: 02/07/21 09:52 Last Resulted: 02/07/21 10:03      Lab Flowsheet    Order Details    View Encounter    Lab and Collection Details    Routing    Result History    View Encounter Conversation      CM=Additional comments      Result Care Coordination   Patient Communication   Add Comments   Add Notifications  Back to Top       Other Results from 02/07/2021  Lactate dehydrogenase Order: [id number] Status: Final result    Visible to patient: Yes (not seen)    Next appt: 06/06/2021 at 10:00 AM in Oncology ([facility])    Dx: Grade 3a follicular lymphoma of lymph...    0 Result Notes Component Ref Range & Units 12 d ago  (02/07/21) 3 mo ago  (11/08/20) 9 mo ago  (05/10/20) 11 mo ago  (03/08/20) 1 yr ago  (02/09/20) 1 yr ago  (02/01/20) 1 yr ago  (12/28/19)  LDH 98 - 192 U/L 139  217 High  CM  127 CM  160 CM  245 High  CM  164 CM  179 CM   Comment: Performed at [facility], [address] [place],  [address]  Resulting Agency  [place] [facility] [place] [facility] [place] [facility] [place] [facility] [place] [facility] [place] [facility] [place] [facility]         Specimen Collected: 02/07/21 09:52 Last Resulted: 02/07/21 10:14      Lab Flowsheet     Order Details    View Encounter    Lab and Collection Details    Routing    Result History    View Encounter Conversation      CM=Additional comments      Result Care Coordination   Patient Communication   Add Comments   Add Notifications  Back to Top          Contains abnormal data Comprehensive metabolic panel Order: [id number] Status: Final result    Visible to patient: Yes (not seen)    Next appt: 06/06/2021 at 10:00 AM in Oncology ([facility])    Dx: Grade 3a follicular lymphoma of lymph...    0 Result Notes Component Ref Range & Units 12 d ago  (02/07/21) 3 wk ago  (01/27/21) 3 mo ago  (11/08/20) 6 mo ago  (08/09/20) 9 mo ago  (05/10/20) 10 mo ago  (04/20/20) 10 mo ago  (04/19/20)  Sodium 135 - 145 mmol/L 139  137  138  139  138  140  140  Potassium 3.5 - 5.1 mmol/L 3.8  3.8  4.3  4.5  4.3  3.3 Low   3.5   Chloride 98 - 111 mmol/L 105  101  103  103  105  105  107   CO2 22 - 32 mmol/L 25  26  26  26  24  25  26    Glucose, Bld 70 - 99 mg/dL 103 High   104 High  CM  104 High  CM  99 CM  99 CM  98 CM  104 High  CM   Comment: Glucose reference range applies only to samples taken after fasting for at least 8 hours.  BUN 6 - 20 mg/dL 9  11  17  20  14  11  8    Creatinine, Ser 0.44 - 1.00 mg/dL 0.69  0.68  0.85  0.80  0.57  0.66  0.64   Calcium 8.9 - 10.3 mg/dL 8.8 Low   8.6 Low   9.3  9.2  9.0  8.6 Low   8.2 Low    Total Protein 6.5 - 8.1 g/dL 7.3  7.3  7.8  7.3  7.2  6.1 Low   6.2 Low    Albumin 3.5 - 5.0 g/dL 3.6  3.6  4.3  4.1  3.9  2.9 Low   3.1 Low    AST 15 - 41 U/L 17  29  21  21  15  21  25    ALT 0 - 44 U/L 22  31  21  25  15  19  22    Alkaline Phosphatase 38 - 126 U/L 130 High   179 High   157 High   131 High   80  82  71   Total Bilirubin 0.3 - 1.2 mg/dL 0.4  0.5  0.5  0.6  0.6  0.4  0.5   GFR, Estimated >60 mL/min >60  >60 CM  >60 CM  >60 CM  >60 CM  >60 CM  >60 CM   Comment: (NOTE)  Calculated using the CKD-EPI Creatinine Equation (2021)   Anion gap 5 - 15  9  10  CM  9 CM  10 CM  9 CM  10 CM  7 CM   Comment: Performed at [facility], [address] [place], [place] [address]  Resulting Agency  [facility] [place] [facility] [place] [facility] [place] [facility] [place] [facility] [place] [facility] [place] [facility]         Specimen Collected: 02/07/21 09:52 Last Resulted: 02/07/21 10:14

## 2021-02-20 ENCOUNTER — Encounter: Payer: Self-pay | Admitting: Internal Medicine

## 2021-02-20 NOTE — Telephone Encounter (Signed)
Called and spoke to pt's daughter. She said that she had accompanied Kim Contreras to her appt with PCP and that PCP told her that her blood counts were low and that she needed to contact oncologist to be seen sooner. That since it had been a year since she had chemo that her counts should start to go up. Informed her of Dr. Sharmaine Base response and recommendation and she verbalized understanding. No other concerns voiced. They will keep appt in March as scheduled and will call back if any new issues arise.

## 2021-03-01 ENCOUNTER — Encounter: Payer: Self-pay | Admitting: Internal Medicine

## 2021-04-12 ENCOUNTER — Encounter: Payer: Self-pay | Admitting: *Deleted

## 2021-04-29 ENCOUNTER — Other Ambulatory Visit: Payer: Self-pay

## 2021-04-29 ENCOUNTER — Emergency Department: Payer: Commercial Managed Care - PPO

## 2021-04-29 ENCOUNTER — Encounter: Payer: Self-pay | Admitting: Emergency Medicine

## 2021-04-29 DIAGNOSIS — B349 Viral infection, unspecified: Secondary | ICD-10-CM | POA: Insufficient documentation

## 2021-04-29 DIAGNOSIS — R059 Cough, unspecified: Secondary | ICD-10-CM | POA: Diagnosis present

## 2021-04-29 DIAGNOSIS — Z8616 Personal history of COVID-19: Secondary | ICD-10-CM | POA: Insufficient documentation

## 2021-04-29 LAB — BASIC METABOLIC PANEL
Anion gap: 8 (ref 5–15)
BUN: 14 mg/dL (ref 6–20)
CO2: 26 mmol/L (ref 22–32)
Calcium: 9.4 mg/dL (ref 8.9–10.3)
Chloride: 103 mmol/L (ref 98–111)
Creatinine, Ser: 0.79 mg/dL (ref 0.44–1.00)
GFR, Estimated: >60 mL/min (ref >60)
Glucose, Bld: 105 mg/dL — ABNORMAL HIGH (ref 70–99)
Potassium: 4 mmol/L (ref 3.5–5.1)
Sodium: 137 mmol/L (ref 135–145)

## 2021-04-29 LAB — TROPONIN I (HIGH SENSITIVITY): Troponin I (High Sensitivity): 3 ng/L (ref <18)

## 2021-04-29 LAB — CBC
HCT: 37.8 % (ref 36.0–46.0)
Hemoglobin: 12.5 g/dL (ref 12.0–15.0)
MCH: 31.2 pg (ref 26.0–34.0)
MCHC: 33.1 g/dL (ref 30.0–36.0)
MCV: 94.3 fL (ref 80.0–100.0)
Platelets: 227 10*3/uL (ref 150–400)
RBC: 4.01 MIL/uL (ref 3.87–5.11)
RDW: 15.1 % (ref 11.5–15.5)
WBC: 4.9 10*3/uL (ref 4.0–10.5)
nRBC: 0 % (ref 0.0–0.2)

## 2021-04-29 LAB — LACTIC ACID, PLASMA: Lactic Acid, Venous: 1 mmol/L (ref 0.5–1.9)

## 2021-04-29 NOTE — ED Triage Notes (Signed)
Pt to ED via POV with c/o coughing, headache and stomach ache since Dec and diagnosed with COVID on 1/4. Some days she feels better but she mostly still feels bad. She denies any fever and sweats a lot at night.

## 2021-04-29 NOTE — ED Triage Notes (Signed)
Pt reports that this is the third time she has had COVID and she always gets pneumonia with the it. She also has cancer.

## 2021-04-30 ENCOUNTER — Emergency Department
Admission: EM | Admit: 2021-04-30 | Discharge: 2021-04-30 | Disposition: A | Discharge status: Home / Self Care | Payer: Commercial Managed Care - PPO | Attending: Emergency Medicine | Admitting: Emergency Medicine

## 2021-04-30 DIAGNOSIS — B349 Viral infection, unspecified: Secondary | ICD-10-CM

## 2021-04-30 DIAGNOSIS — R052 Subacute cough: Secondary | ICD-10-CM

## 2021-04-30 DIAGNOSIS — J029 Acute pharyngitis, unspecified: Secondary | ICD-10-CM

## 2021-04-30 MED ORDER — HYDROCOD POLI-CHLORPHE POLI ER 10-8 MG/5ML PO SUER
5.0000 mL | Freq: Once | ORAL | Status: AC
  Administered 2021-04-30: 5 mL via ORAL
  Filled 2021-04-30: qty 5

## 2021-04-30 MED ORDER — BENZONATATE 100 MG PO CAPS: 100.0000 mg | ORAL_CAPSULE | Freq: Three times a day (TID) | ORAL | 0 refills | Status: DC | PRN

## 2021-04-30 MED ORDER — HYDROCOD POLI-CHLORPHE POLI ER 10-8 MG/5ML PO SUER: 5.0000 mL | Freq: Two times a day (BID) | ORAL | 0 refills | Status: DC | PRN

## 2021-04-30 NOTE — ED Provider Notes (Signed)
[facility] Provider Note    Event Date/Time   First MD Initiated Contact with Patient 04/30/21 0011     (approximate)   History   No chief complaint on file.   The patient and/or family speak(s) Spanish.  They understand they have the right to the use of a hospital interpreter, however at this time they prefer to speak directly with me in [place].  They know that they can ask for an interpreter at any time.   HPI  Kim Contreras is a 61 y.o. female whose medical history includes a history of lymphoma which has been treated by Dr. Rogue Bussing and is no longer undergoing treatment.  She has also had COVID-19 3 different times and has had pneumonia associated with that at least 2 of the prior times.  She presents tonight with her daughter who is at bedside for evaluation of persistent cough.  She said that she tested positive for COVID-19 about 3 to 4 weeks ago.  Since that time she has had a persistent cough and sore throat.  It will get a little bit better and then gets worse again.  Her throat feels irritated from all the coughing and she is continuing to produce phlegm.  She does not have shortness of breath except when she has a coughing spell.  She denies chest pain and abdominal pain and has had no nausea nor vomiting, but she continues to feel weak and ill in general.  She followed up with her primary care doctor but nothing in particular was done.  She is not feeling worse, just no better after 3 to 4 weeks.     Physical Exam   Triage Vital Signs: ED Triage Vitals  Enc Vitals Group     BP 04/29/21 2008 (!) 160/94     Pulse Rate 04/29/21 2008 82     Resp 04/29/21 2008 18     Temp 04/29/21 2008 99.5 F (37.5 C)     Temp Source 04/29/21 2008 Oral     SpO2 04/29/21 2008 95 %     Weight 04/29/21 2020 59.9 kg (132 lb)     Height 04/29/21 2020 1.524 m (5')     Head Circumference --      Peak Flow --      Pain Score 04/29/21 2019 8     Pain Loc --       Pain Edu? --      Excl. in [place]? --     Most recent vital signs: Vitals:   04/30/21 0019 04/30/21 0100  BP: (!) 144/86 138/78  Pulse: 68 75  Resp: 20 16  Temp:    SpO2: 100% 98%     General: Awake, no distress.  HEENT:   Moist oral mucosa.  No erythema in the oropharynx, no exudate, no petechiae.  No tenderness to palpation nor lymphadenopathy upon palpation of her neck and submandibular areas. CV:  Good peripheral perfusion.  Resp:  Normal effort.  Lungs are clear to auscultation.  No wheezing, rales, or rhonchi. Abd:  No distention.  No tenderness to palpation. Other:  No focal neurological deficits.  Speaking in full and complete sentences without difficulty.   ED Results / Procedures / Treatments   Labs (all labs ordered are listed, but only abnormal results are displayed) Labs Reviewed  BASIC METABOLIC PANEL - Abnormal; Notable for the following components:      Result Value   Glucose, Bld 105 (*)  All other components within normal limits  CBC  LACTIC ACID, PLASMA  TROPONIN I (HIGH SENSITIVITY)     RADIOLOGY I personally reviewed the patient's chest x-ray and see no evidence of acute infection.  Radiologist agrees.    PROCEDURES:  Critical Care performed: No  Procedures   MEDICATIONS ORDERED IN ED: Medications  chlorpheniramine-HYDROcodone 10-[name]/5ML suspension 5 mL (5 mLs Oral Given 04/30/21 0054)     IMPRESSION / MDM / ASSESSMENT AND PLAN / ED COURSE  I reviewed the triage vital signs and the nursing notes.                              Differential diagnosis includes, but is not limited to, persistent viral illness, COVID-19, influenza, pneumonia, epiglottitis, tonsillitis.  Patient overall is well in appearance.  Vital signs are stable and within normal limits.  She is not undergoing chemotherapy and has finished lymphoma treatment and should no longer be immunosuppressed.  Labs ordered include basic metabolic panel, CBC, lactic acid, and  high-sensitivity troponin.  Reviewed the lab results and her basic metabolic panel, CBC, and lactic acid are all within normal limits, as is her high-sensitivity troponin.  I personally reviewed her chest x-ray and there is no evidence of infiltrate.  I reviewed her EKG and it is normal in appearance with no concerning abnormalities and no evidence of ischemia.  Her physical exam is also reassuring with no sign of acute infection.  Provided reassurance to the patient and her daughter.  I provided a dose of cough medicine with hydrocodone in the emergency department and prescriptions as listed below.  I encouraged close outpatient follow-up.  No indication for admission.  She understands and agrees with the plan and I gave my usual and customary return precautions.           FINAL CLINICAL IMPRESSION(S) / ED DIAGNOSES   Final diagnoses:  Subacute cough  Sore throat  Viral illness     Rx / DC Orders   ED Discharge Orders          Ordered    chlorpheniramine-HYDROcodone (TUSSIONEX PENNKINETIC ER) 10-[name]/5ML  Every 12 hours PRN        04/30/21 0112    benzonatate (TESSALON PERLES) 100 MG capsule  3 times daily PRN        04/30/21 0112             Note:  This document was prepared using Dragon voice recognition software and may include unintentional dictation errors.   Hinda Kehr, MD 04/30/21 [phone]

## 2021-05-26 ENCOUNTER — Other Ambulatory Visit: Payer: Self-pay

## 2021-05-26 ENCOUNTER — Emergency Department
Admission: EM | Admit: 2021-05-26 | Discharge: 2021-05-26 | Disposition: A | Discharge status: Home / Self Care | Payer: Commercial Managed Care - PPO | Attending: Emergency Medicine | Admitting: Emergency Medicine

## 2021-05-26 ENCOUNTER — Encounter: Payer: Self-pay | Admitting: Emergency Medicine

## 2021-05-26 DIAGNOSIS — B029 Zoster without complications: Secondary | ICD-10-CM | POA: Diagnosis not present

## 2021-05-26 DIAGNOSIS — R52 Pain, unspecified: Secondary | ICD-10-CM

## 2021-05-26 DIAGNOSIS — R21 Rash and other nonspecific skin eruption: Secondary | ICD-10-CM | POA: Diagnosis present

## 2021-05-26 DIAGNOSIS — B028 Zoster with other complications: Secondary | ICD-10-CM

## 2021-05-26 MED ORDER — KETOROLAC TROMETHAMINE 30 MG/ML IJ SOLN
30.0000 mg | Freq: Once | INTRAMUSCULAR | Status: DC
  Filled 2021-05-26: qty 1

## 2021-05-26 MED ORDER — HYDROCODONE-ACETAMINOPHEN 5-325 MG PO TABS
1.0000 | ORAL_TABLET | Freq: Once | ORAL | Status: AC
  Administered 2021-05-26: 1 via ORAL
  Filled 2021-05-26: qty 1

## 2021-05-26 MED ORDER — KETOROLAC TROMETHAMINE 30 MG/ML IJ SOLN
30.0000 mg | Freq: Once | INTRAMUSCULAR | Status: AC
  Administered 2021-05-26: 30 mg via INTRAMUSCULAR

## 2021-05-26 MED ORDER — PREDNISONE 10 MG PO TABS: ORAL_TABLET | ORAL | 0 refills | Status: DC

## 2021-05-26 NOTE — ED Triage Notes (Signed)
Pt reports burning and painful rash to right side of back for over a week, concern for shingles.

## 2021-05-26 NOTE — ED Notes (Signed)
Pt with daughter- pt has had a painful rash on the r side of her back, pt is worried it might be shingles

## 2021-05-26 NOTE — Discharge Instructions (Signed)
You were seen today for uncontrolled shingles pain.  Please take gabapentin, valacyclovir and hydrocodone as previously prescribed by your PCP.  I am adding a steroid for you to take for the next 6 days.  Please follow-up with your PCP if symptoms persist.

## 2021-05-26 NOTE — ED Provider Notes (Signed)
Kim Contreras Provider Note    Event Date/Time   First MD Initiated Contact with Patient 05/26/21 [phone]     (approximate)   History   Rash   HPI  IFRAH VEST is a 61 y.o. female presents to the ER, accompanied by her daughter, with complaint of uncontrolled shingles pain.  She reports she was seen by her PCP 1 week ago, diagnosed with shingles.  She was treated with Valacyclovir and Gabapentin however she reports this is not doing anything for her Lidocaine cream, shingles pain.  She has not tried anything OTC for this.  She reports she does have lymphoma but has not currently undergoing any treatment at this time.  She has not tried any medications OTC other than what has been prescribed.    Physical Exam   Triage Vital Signs: ED Triage Vitals  Enc Vitals Group     BP 05/26/21 0752 (!) 163/85     Pulse Rate 05/26/21 0752 78     Resp 05/26/21 0752 18     Temp 05/26/21 0752 98.7 F (37.1 C)     Temp Source 05/26/21 0752 Oral     SpO2 05/26/21 0752 99 %     Weight 05/26/21 0748 132 lb 4.4 oz (60 kg)     Height 05/26/21 0748 5' (1.524 m)     Head Circumference --      Peak Flow --      Pain Score 05/26/21 0748 10     Pain Loc --      Pain Edu? --      Excl. in [place]? --     Most recent vital signs: Vitals:   05/26/21 0752  BP: (!) 163/85  Pulse: 78  Resp: 18  Temp: 98.7 F (37.1 C)  SpO2: 99%    General: Awake, appears uncomfortable but in no distress.  CV:  RRR, no murmur noted. Resp:  Normal effort.  CTA bilaterally. Skin:  Grouped vesicular/scabbed rash in dermatomal pattern noted from the midline back extending around to the right side of the abdomen   ED Results / Procedures / Treatments    MEDICATIONS ORDERED IN ED: Medications  HYDROcodone-acetaminophen (NORCO/VICODIN) 5-325 MG per tablet 1 tablet (1 tablet Oral Given 05/26/21 0914)  HYDROcodone-acetaminophen (NORCO/VICODIN) 5-325 MG per tablet 1 tablet (1 tablet Oral Given  05/26/21 1058)  ketorolac (TORADOL) 30 MG/ML injection 30 mg (30 mg Intramuscular Given 05/26/21 1103)     IMPRESSION / MDM / ASSESSMENT AND PLAN / ED COURSE  I reviewed the triage vital signs and the nursing notes.  Shingles with Uncontrolled Pain:  Hydrocodone 5-325 mg p.o. x2 given 1 hour apart for pain 30 mg Toradol IM x 1 Advised her to continue Valacyclovir and Gabapentin as prescribed by her PCP Rx for Pred taper x6 days According to PDMP, she was prescribed Hydrocodone 2 days ago which according to [facility] was picked up by her husband.  She is adamantly denying that she got a prescription for Hydrocodone. Advised her to follow-up with her PCP as an outpatient  FINAL CLINICAL IMPRESSION(S) / ED DIAGNOSES   Final diagnoses:  Herpes zoster with other complication  Pain     Rx / DC Orders   Prednisone 10 mg 6 day Taper   Note:  This document was prepared using Dragon voice recognition software and may include unintentional dictation errors.    Jearld Fenton, NP 05/26/21 1211    Duffy Bruce, MD 05/27/21 [phone]

## 2021-05-28 IMAGING — CR DG CHEST 2V
2 series · 2 of 2 positions shown · non-contrast
Comparison: July 08, 2019.

CLINICAL DATA: Cough, fever.

EXAM:
CHEST - 2 VIEW

[chest pa]
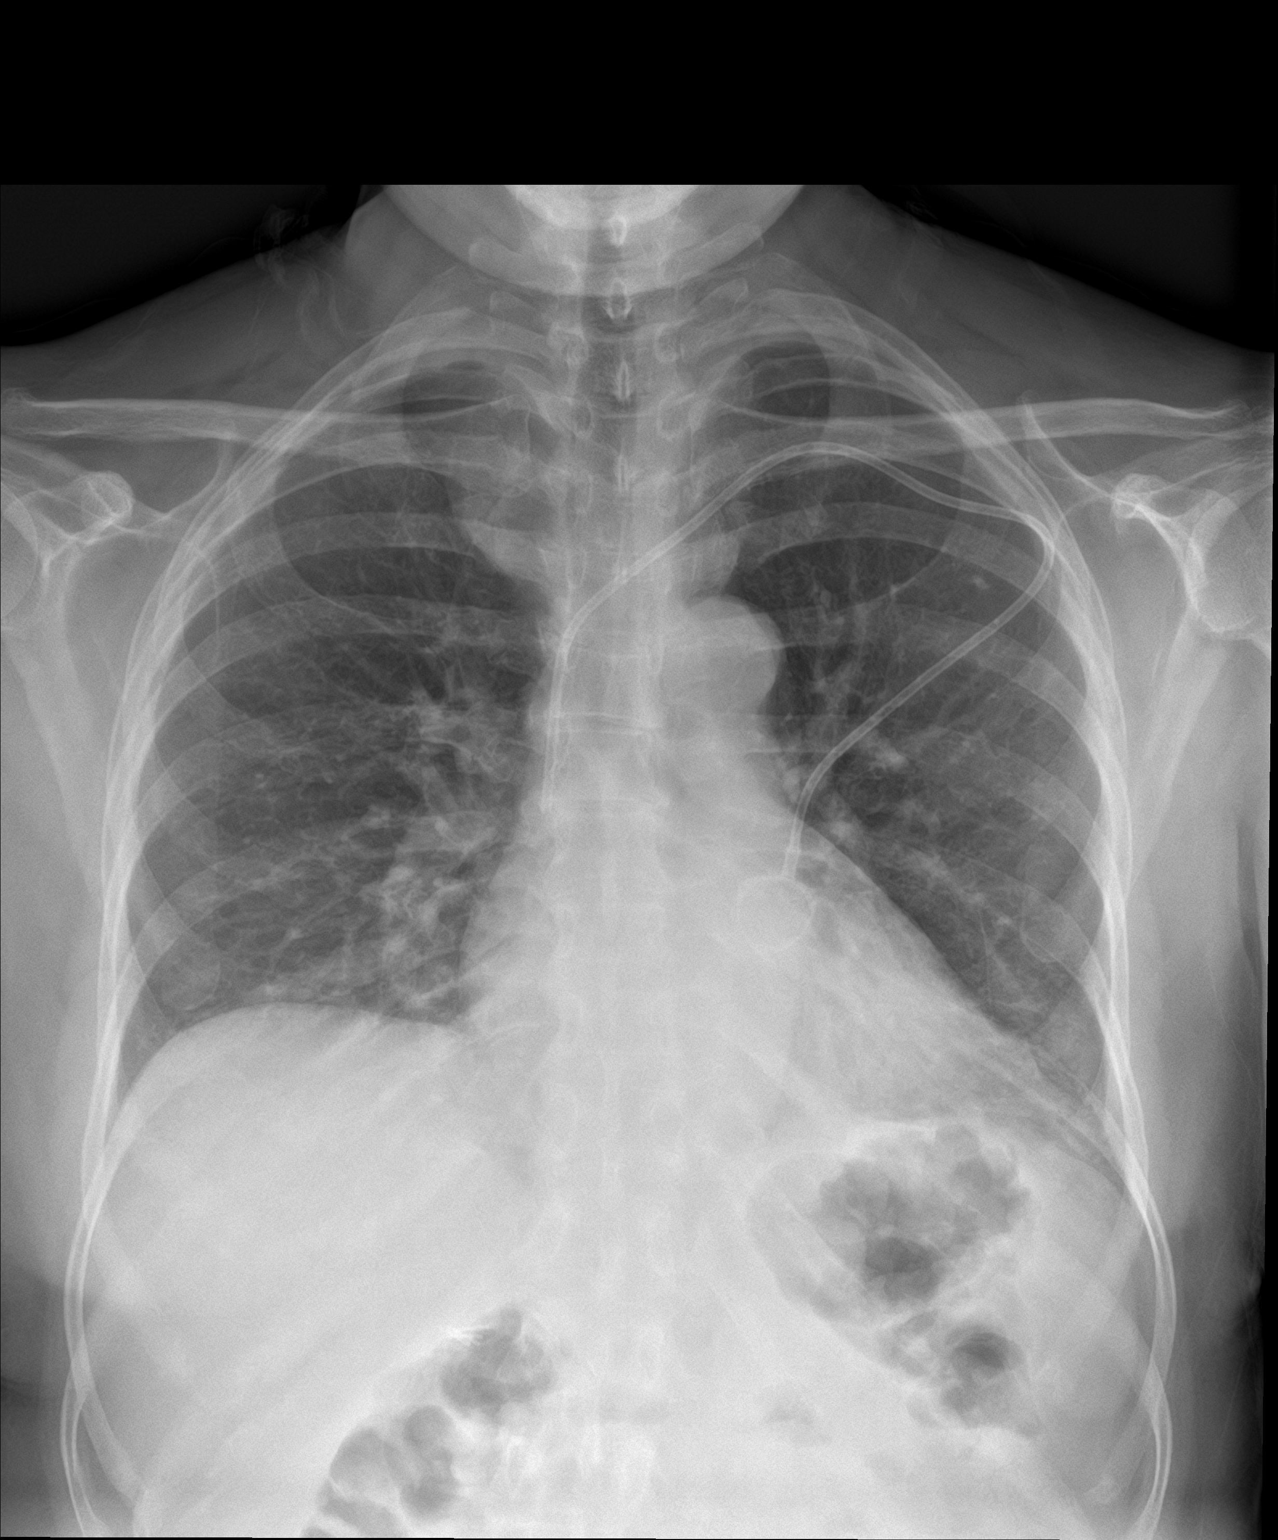

[chest lat]
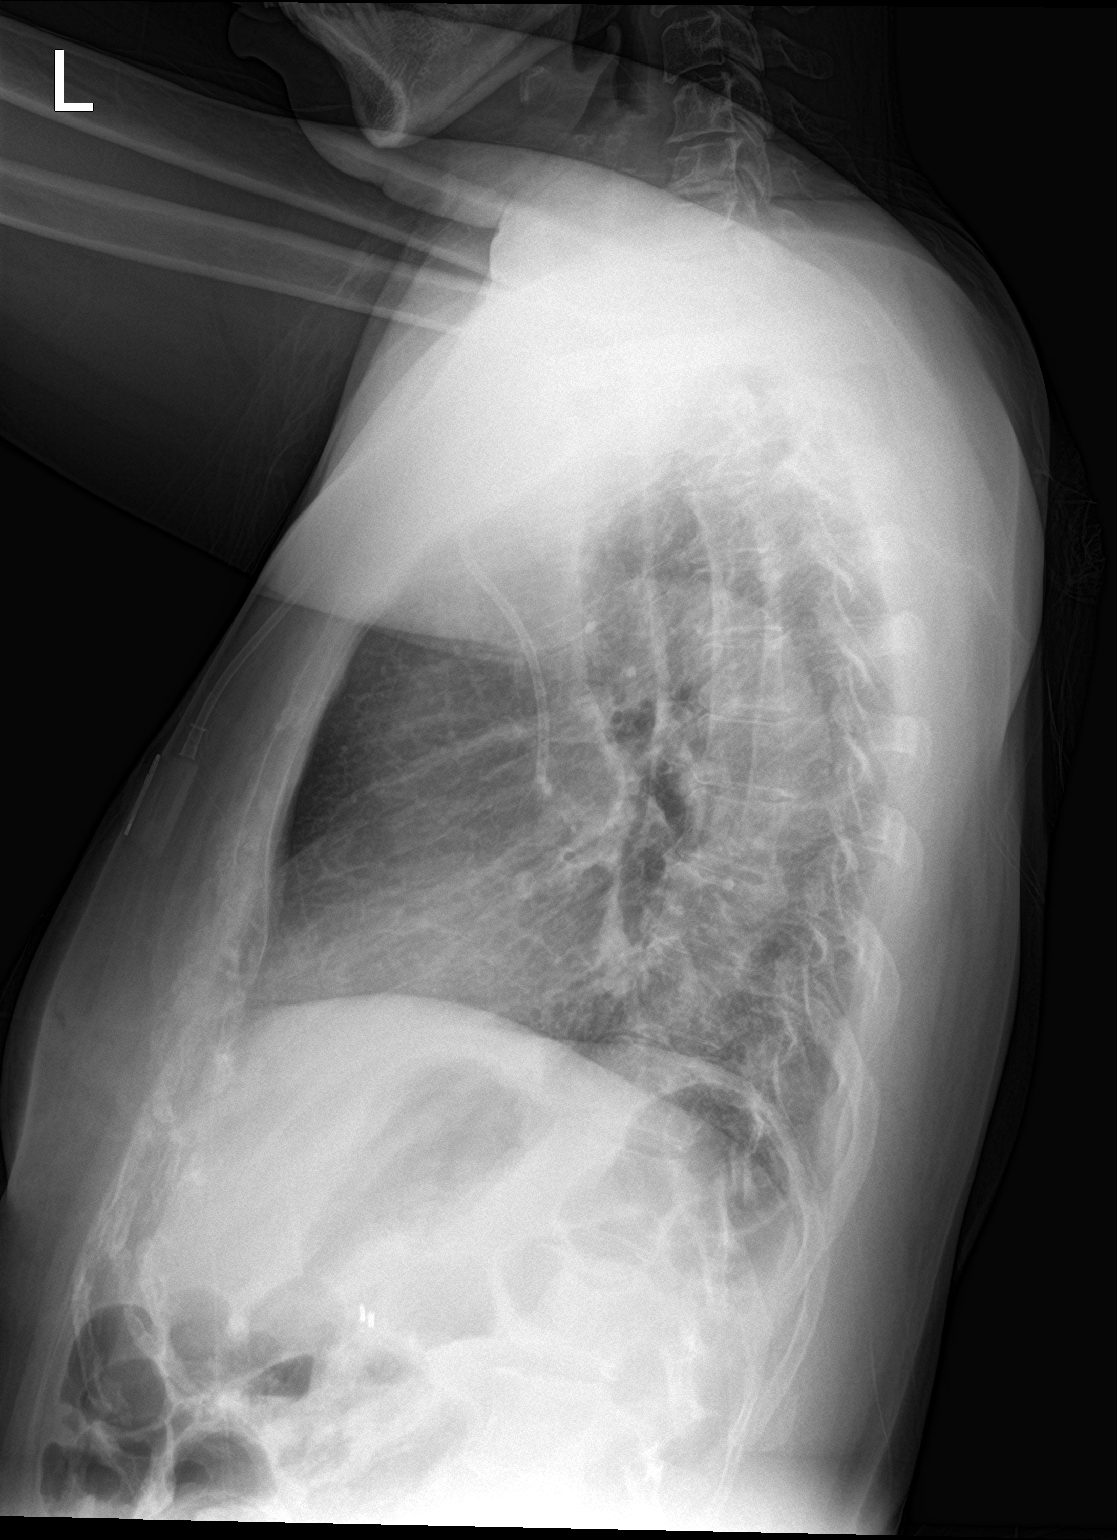

[2 of 2 positions shown; findings below may reference images not displayed]

FINDINGS: The heart size and mediastinal contours are within normal limits.
Both lungs are clear. Left subclavian Port-A-Cath is noted with tip
in expected position of the SVC. The visualized skeletal structures
are unremarkable.
IMPRESSION: No active cardiopulmonary disease.

## 2021-06-06 ENCOUNTER — Other Ambulatory Visit: Payer: Self-pay

## 2021-06-06 ENCOUNTER — Inpatient Hospital Stay: Payer: Commercial Managed Care - PPO | Admitting: Internal Medicine

## 2021-06-06 ENCOUNTER — Encounter: Payer: Self-pay | Admitting: Internal Medicine

## 2021-06-06 ENCOUNTER — Inpatient Hospital Stay: Payer: Commercial Managed Care - PPO | Attending: Internal Medicine

## 2021-06-06 VITALS — BP 133/79 | HR 66 | Temp 98.0°F | Resp 16 | Wt 132.2 lb

## 2021-06-06 DIAGNOSIS — B029 Zoster without complications: Secondary | ICD-10-CM | POA: Diagnosis not present

## 2021-06-06 DIAGNOSIS — I10 Essential (primary) hypertension: Secondary | ICD-10-CM | POA: Insufficient documentation

## 2021-06-06 DIAGNOSIS — C8238 Follicular lymphoma grade IIIa, lymph nodes of multiple sites: Secondary | ICD-10-CM

## 2021-06-06 DIAGNOSIS — Z803 Family history of malignant neoplasm of breast: Secondary | ICD-10-CM | POA: Insufficient documentation

## 2021-06-06 LAB — CBC WITH DIFFERENTIAL/PLATELET
Abs Immature Granulocytes: 0.01 10*3/uL (ref 0.00–0.07)
Basophils Absolute: 0 10*3/uL (ref 0.0–0.1)
Basophils Relative: 1 %
Eosinophils Absolute: 0.1 10*3/uL (ref 0.0–0.5)
Eosinophils Relative: 3 %
HCT: 39.8 % (ref 36.0–46.0)
Hemoglobin: 13.3 g/dL (ref 12.0–15.0)
Immature Granulocytes: 0 %
Lymphocytes Relative: 37 %
Lymphs Abs: 1.2 10*3/uL (ref 0.7–4.0)
MCH: 32.2 pg (ref 26.0–34.0)
MCHC: 33.4 g/dL (ref 30.0–36.0)
MCV: 96.4 fL (ref 80.0–100.0)
Monocytes Absolute: 0.3 10*3/uL (ref 0.1–1.0)
Monocytes Relative: 10 %
Neutro Abs: 1.7 10*3/uL (ref 1.7–7.7)
Neutrophils Relative %: 49 %
Platelets: 221 10*3/uL (ref 150–400)
RBC: 4.13 MIL/uL (ref 3.87–5.11)
RDW: 14.7 % (ref 11.5–15.5)
WBC: 3.4 10*3/uL — ABNORMAL LOW (ref 4.0–10.5)
nRBC: 0 % (ref 0.0–0.2)

## 2021-06-06 LAB — COMPREHENSIVE METABOLIC PANEL
ALT: 13 U/L (ref 0–44)
AST: 13 U/L — ABNORMAL LOW (ref 15–41)
Albumin: 4.2 g/dL (ref 3.5–5.0)
Alkaline Phosphatase: 72 U/L (ref 38–126)
Anion gap: 7 (ref 5–15)
BUN: 18 mg/dL (ref 6–20)
CO2: 26 mmol/L (ref 22–32)
Calcium: 9.1 mg/dL (ref 8.9–10.3)
Chloride: 103 mmol/L (ref 98–111)
Creatinine, Ser: 0.68 mg/dL (ref 0.44–1.00)
GFR, Estimated: >60 mL/min (ref >60)
Glucose, Bld: 96 mg/dL (ref 70–99)
Potassium: 4 mmol/L (ref 3.5–5.1)
Sodium: 136 mmol/L (ref 135–145)
Total Bilirubin: 0.8 mg/dL (ref 0.3–1.2)
Total Protein: 7.2 g/dL (ref 6.5–8.1)

## 2021-06-06 LAB — LACTATE DEHYDROGENASE: LDH: 115 U/L (ref 98–192)

## 2021-06-06 NOTE — Assessment & Plan Note (Addendum)
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 S/p Obi-benda x6 cylces-FEB 8th, 2022-   No findings suspicious for residual or recurrent lymphoma;  Regressing matted soft tissue density in the mesentery and retroperitoneum consistent with treated disease.  STABLE.  We will plan to get repeat imaging in 3 months. ? ?#Neutropenia /infection -OCT 2022-hospitalization pneumonia-CTA bilateral pneumonia-s/p antibiotics improved.  ANC 1.3 monitor for now.  Awaiting on  immunoglobulin levels today.  Discussed the possible need for IVIG infusions if severe low immunoglobulins noted. ? ?# Shingles- s/p valcyclovir; discussed the course of postherpetic neuralgia.  Patient on gabapentin. ? ?#Right submandibular lymph node-1 to 2 cm monitor for now.  Not any worse. ? ?# chronic pain right quadrant abdmonal pain/chornic- EGD/Colo [NOV 2022- ] in nov 2022 [2016-colo]- STABLE.  ? ?# Vaccination:2022- s/p Flu shot; s/p COVID; defer to PCP re: pneumonia vaccination ? ?will call [name], daughter- re: IGgs ? ?# DISPOSITION:  ?# Follow up in 3 months-- ;MD; labs- cbc/cmp;LDH;CT N/CAP prior- Dr.B  ? ? ? ?

## 2021-06-06 NOTE — Progress Notes (Signed)
[place] NOTE  Patient Care Team: Center, [facility] as PCP - General ([place]) [name], [name], MD as Consulting Physician (General Surgery)  CHIEF COMPLAINTS/PURPOSE OF CONSULTATION: Lymphoma   Oncology History Overview Note  # 5th FEB 2021- ABDOMINAL MASS-  9.3 x 3.0 cm central mesenteric soft tissue mass is identified on image 38/series 2. 3.0x 3.0 cm collar of soft tissue surrounds branches of the superior mesenteric artery and vein on image 47/2 with relatively little mass-effect on the vascular anatomy. 2.0 x 1.5 cm nodular component of this abnormal soft tissue is identified in the mesentery of the right pelvis on 55/2. FEB 2021- PET-  PET scan-February 2021-SUV around 5.5 again highly suggestive of malignancy lymphoma.  Small cluster left supra pelvic lymph node/small retroperitoneal lymph nodes-5 mm-Douville score 3.   # MARCH 1287- grade 3A follicular lymphoma [Open biopsy Dr. Burnett-] STAGE II vs III (equivocal neck lymph node) [no bone marrow bx - #Status post-Gazyva-Bendamustine x6 cycles [finished August 2021]- PET scan February, 2022-treated disease noted to have any progression.  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 ([place])   07/11/2019 -  Chemotherapy   The patient had dexamethasone (DECADRON) 4 MG tablet, 8 mg, Oral, Daily, 1 of 1 cycle, Start date: --, [name], 0.25 mg, Intravenous,  Once, 6 of 6 cycles Administration: 0.25 mg (07/11/2019), 0.25 mg (08/22/2019), 0.25 mg (09/22/2019), 0.25 mg (11/28/2019), 0.25 mg (01/03/2020) pegfilgrastim-cbqv (UDENYCA) injection 6 mg, 6 mg, Subcutaneous, Once, 3 of 3 cycles Administration: 6 mg (11/30/2019), 6 mg  (01/05/2020), 6 mg (02/13/2020) bendamustine (BENDEKA) 150 mg in sodium chloride 0.9 % 50 mL (2.6786 mg/mL) chemo infusion, 90 mg/m2 = 150 mg, Intravenous,  Once, [name]50 mg (07/11/2019), 150 mg (07/12/2019), 150 mg (08/22/2019), 150 mg (08/24/2019), 150 mg (09/22/2019), 150 mg (09/23/2019), 150 mg (11/28/2019), 150 mg (11/29/2019), 150 mg (01/03/2020), 150 mg (01/04/2020), 150 mg (02/10/2020) obinutuzumab (GAZYVA) 1,[name], 1,000 mg, Intravenous, Once, [name],000 mg (07/11/2019), 1,000 mg (07/18/2019), 1,000 mg (08/22/2019), 1,000 mg (07/27/2019), 1,000 mg (09/22/2019), 1,000 mg (11/28/2019), 1,000 mg (01/03/2020)   for chemotherapy treatment.        HISTORY OF PRESENTING ILLNESS: Patient speaks minimal English/patient daughter [name] in office.  Cordelia Poche [place] 61 y.o.  female with follicular lymphoma grade 3 status post Gazyva-Benda currently on surveillance is here for follow-up.  Last chemotherapy approximately year ago-February 2022.  Patient reports that she has been diagnosed with Shingles 3 weeks ago that is being treated by PCP.  Also having right side pain and not sure if related to shingle pain (pain scale 6/10 today).  Patient is on gabapentin.  No further episodes of pneumonia.  Continues to complain of mild swelling in the right neck.   She continues to complain of chronic fatigue.    Review of Systems  Constitutional:  Positive for malaise/fatigue. Negative for chills, diaphoresis, [name], hemoptysis, sputum production, [name],  palpitations, [name], diarrhea, heartburn, melena and  vomiting.  Musculoskeletal:  Positive for back pain and joint pain.  Skin: Negative.  Negative for itching and rash.  Neurological:  Positive for tingling. Negative for dizziness, focal weakness and weakness.  Endo/Heme/Allergies:  Does not bruise/bleed easily.  Psychiatric/Behavioral:  Negative for depression. The patient is not nervous/anxious.     MEDICAL HISTORY:  Past Medical History:  Diagnosis Date   Anemia 2006   Anxiety    Arthritis    Diverticulosis    Helicobacter pylori gastritis    History of hiatal hernia    Hypertension    Lymphoma ([place])     SURGICAL HISTORY: Past Surgical History:  Procedure Laterality Date   APPENDECTOMY     CHOLECYSTECTOMY N/A 01/19/2015   Procedure: LAPAROSCOPIC CHOLECYSTECTOMY;  [name], MD;  [name];  Service: General;  [name];   COLONOSCOPY WITH PROPOFOL N/A 10/27/2014   Procedure: COLONOSCOPY WITH PROPOFOL;  [name], MD;  Location: [facility] ENDOSCOPY;  Service: Endoscopy;  [name];   COLONOSCOPY WITH PROPOFOL N/A 02/05/2021   Procedure: COLONOSCOPY WITH PROPOFOL;  [name], MD;  [name];  Service: Endoscopy;  [name];   ESOPHAGOGASTRODUODENOSCOPY     ESOPHAGOGASTRODUODENOSCOPY (EGD) WITH PROPOFOL N/A 02/05/2021   Procedure: ESOPHAGOGASTRODUODENOSCOPY (EGD) WITH PROPOFOL;  [name], MD;  [name];  Service: Endoscopy;  [name];   EXCISION MASS ABDOMINAL N/A 06/03/2019   Procedure: EXCISION MASS ABDOMINAL;  [name], MD;  [name];  Service: General;  [name];  abdominal node biopsy   NASAL SINUS SURGERY     PORT-A-CATH REMOVAL  09/2020   by Dr. Towanda Malkin PLACEMENT Left 07/08/2019   Procedure: INSERTION PORT-A-CATH;  [name], MD;  [name];  Service: General;  Laterality: Left;   TUBAL LIGATION      SOCIAL  HISTORY: Social History   Socioeconomic History   Marital status: Married    Spouse name: Not on file   Number of children: Not on file   Years of education: Not on file   Highest education level: Not on file  Occupational History   Not on file  Tobacco Use   Smoking status: Never   Smokeless tobacco: Never  Vaping Use   Vaping Use: Never used  Substance and Sexual Activity   Alcohol use: No   Drug use: No   Sexual activity: Not on file  Other Topics Concern   Not on file  Social History Narrative   Not on file   Social Determinants of Health   Financial Resource Strain: Not on file  Food Insecurity: Not on file  Transportation Needs: Not on file  Physical Activity: Not on file  Stress: Not on file  Social Connections: Not on file  Intimate Partner Violence: Not on file    FAMILY HISTORY: Family History  Problem Relation Age of Onset   Breast cancer Other 43   Prostate cancer Neg Hx    Bladder Cancer Neg Hx    Kidney cancer Neg Hx     ALLERGIES:  is allergic to chlorhexidine and pseudoephedrine hcl.  MEDICATIONS:  Current Outpatient Medications  Medication Sig Dispense Refill   albuterol (VENTOLIN HFA) 108 (90 Base) MCG/ACT inhaler Inhale into the lungs.     gabapentin (NEURONTIN) 300 MG capsule Take  600 mg by mouth 2 (two) times daily.     levocetirizine (XYZAL) 5 MG tablet Take 5 mg by mouth every evening.     losartan (COZAAR) 50 MG tablet Take 50 mg by mouth daily.     montelukast (SINGULAIR) 10 MG tablet Take 10 mg by mouth every evening.     sucralfate (CARAFATE) 1 g tablet Take 1 g by mouth 4 (four) times daily as needed.     benzonatate (TESSALON PERLES) 100 MG capsule Take 1 capsule (100 mg total) by mouth 3 (three) times daily as needed for cough. (Patient not taking: Reported on 06/06/2021) 30 capsule 0   cefdinir (OMNICEF) 300 MG capsule Take 1 capsule (300 mg total) by mouth 2 (two) times daily. (Patient not taking: Reported on 02/04/2021) 14 capsule  0   chlorpheniramine-HYDROcodone (TUSSIONEX PENNKINETIC ER) 10-[name]/5ML Take 5 mLs by mouth every 12 (twelve) hours as needed for cough. (Patient not taking: Reported on 06/06/2021) 115 mL 0   esomeprazole (NEXIUM) 40 MG capsule Take 40 mg by mouth daily at 12 noon. (Patient not taking: Reported on 02/07/2021)     gabapentin (NEURONTIN) 100 MG capsule Take 100 mg by mouth daily. (Patient not taking: Reported on 02/07/2021)     mirabegron ER (MYRBETRIQ) 25 MG TB24 tablet Take 1 tablet (25 mg total) by mouth daily. (Patient not taking: Reported on 06/06/2021) 30 tablet 11   pantoprazole (PROTONIX) 40 MG tablet Take 1 tablet (40 mg total) by mouth daily. (Patient not taking: Reported on 02/07/2021) 30 tablet 3   predniSONE (DELTASONE) 10 MG tablet Take 6 tabs on day 1, 5 tabs on day 2, 4 tabs on day 3, 3 tabs on day 4, 2 tabs on day 5, 1 tab on day 6 (Patient not taking: Reported on 06/06/2021) 21 tablet 0   No current facility-administered medications for this visit.   Facility-Administered Medications Ordered in Other Visits  Medication Dose Route Frequency Provider Last Rate Last Admin   sodium chloride flush (NS) 0.9 % injection 10 mL  10 mL Intravenous PRN [name], MD   10 mL at 12/28/19 0815      .  PHYSICAL EXAMINATION: ECOG PERFORMANCE STATUS: 0 - Asymptomatic  Vitals:   06/06/21 1000  BP: 133/79  Pulse: 66  Resp: 16  Temp: 98 F (36.7 C)   Filed Weights   06/06/21 1000  Weight: 132 lb 3.2 oz (60 kg)    Physical Exam HENT:     Head: Normocephalic and atraumatic.     Mouth/Throat:     Pharynx: No oropharyngeal exudate.  Eyes:     Pupils: Pupils are equal, round, and reactive to light.  Cardiovascular:     Rate and Rhythm: Normal rate and regular rhythm.  Pulmonary:     Effort: Pulmonary effort is normal. No respiratory distress.     Breath sounds: Normal breath sounds. No wheezing.  Abdominal:     General: Bowel sounds are normal. There is no distension.      Palpations: Abdomen is soft. There is no mass.     Tenderness: There is no abdominal tenderness. There is no guarding or rebound.  Musculoskeletal:        General: No tenderness. Normal range of motion.     Cervical back: Normal range of motion and neck supple.     Comments: Mild swelling of the left lower extremity; mild tenderness noted.  Skin:    General: Skin is warm.  Neurological:  Mental Status: She is alert and oriented to person, place, and time.  Psychiatric:        Mood and Affect: Affect normal.     LABORATORY DATA:  I have reviewed the data as listed Lab Results  Component Value Date   WBC 3.4 (L) 06/06/2021   HGB 13.3 06/06/2021   HCT 39.8 06/06/2021   MCV 96.4 06/06/2021   PLT 221 06/06/2021   Recent Labs    01/27/21 1857 02/07/21 0952 04/29/21 2030 06/06/21 0937  NA 137 139 137 136  K 3.8 3.8 4.0 4.0  CL 101 105 103 103  CO2 '26 25 26 26  '$ GLUCOSE 104* 103* 105* 96  BUN '11 9 14 18  '$ CREATININE 0.68 0.69 0.79 0.68  CALCIUM 8.6* 8.8* 9.4 9.1  GFRNONAA >60 >60 >60 >60  PROT 7.3 7.3  --  7.2  ALBUMIN 3.6 3.6  --  4.2  AST 29 17  --  13*  ALT 31 22  --  13  ALKPHOS 179* 130*  --  72  BILITOT 0.5 0.4  --  0.8    RADIOGRAPHIC STUDIES: I have personally reviewed the radiological images as listed and agreed with the findings in the report. No results found.   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 S/p Obi-benda x6 cylces-FEB 8th, 2022-   No findings suspicious for residual or recurrent lymphoma;  Regressing matted soft tissue density in the mesentery and retroperitoneum consistent with treated disease.  STABLE.  We will plan to get repeat imaging in 3 months.  #Neutropenia /infection -OCT 2022-hospitalization pneumonia-CTA bilateral pneumonia-s/p antibiotics improved.  ANC 1.3 monitor for now.  Awaiting on   immunoglobulin levels today.  Discussed the possible need for IVIG infusions if severe low immunoglobulins noted.  # Shingles- s/p valcyclovir; discussed the course of postherpetic neuralgia.  Patient on gabapentin.  #Right submandibular lymph node-1 to 2 cm monitor for now.  Not any worse.  # chronic pain right quadrant abdmonal pain/chornic- EGD/Colo [NOV 2022- ] in nov 2022 [2016-colo]- STABLE.   # Vaccination:2022- s/p Flu shot; s/p COVID; defer to PCP re: pneumonia vaccination  will call [name], daughter- re: IGgs  # DISPOSITION:  # Follow up in 3 months-- ;MD; labs- cbc/cmp;LDH;CT N/CAP prior- Dr.B      All questions were answered. The patient knows to call the clinic with any problems, questions or concerns.    [name], MD 06/06/2021 1:03 PM

## 2021-06-06 NOTE — Progress Notes (Signed)
Patient reports that she has been diagnosed with Shingles 3 weeks ago that is being treated by PCP.  Also having right side pain and not sure if related to shingle pain (pain scale 6/10 today). ? ?Occasional choking sensation when drinking water. ? ?

## 2021-06-07 LAB — IGG, IGA, IGM
IgA: 140 mg/dL (ref 87–352)
IgG (Immunoglobin G), Serum: 1203 mg/dL (ref 586–1602)
IgM (Immunoglobulin M), Srm: 104 mg/dL (ref 26–217)

## 2021-06-11 ENCOUNTER — Encounter: Payer: Self-pay | Admitting: Internal Medicine

## 2021-06-11 NOTE — Progress Notes (Signed)
Unable to reach the patient. Left a voicemail for the daughter that patient does not need to have IV. I G infusions as a levels of immunoglobulin are adequate. Patient will follow up as planned.

## 2021-06-13 ENCOUNTER — Telehealth: Payer: Self-pay | Admitting: Internal Medicine

## 2021-06-13 NOTE — Telephone Encounter (Signed)
x

## 2021-07-08 IMAGING — CT NM PET TUM IMG RESTAG (PS) SKULL BASE T - THIGH
1 of 10 series · 1 of 25 positions shown · non-contrast
Comparison: PET-CT 10/05/2019 and 05/18/2019

CLINICAL DATA: Subsequent treatment strategy for follicular
lymphoma.

EXAM:
NUCLEAR MEDICINE PET SKULL BASE TO THIGH
TECHNIQUE: 7.4 mCi F-18 FDG was injected intravenously. Full-ring PET imaging
was performed from the skull base to thigh after the radiotracer. CT
data was obtained and used for attenuation correction and anatomic
localization.
Fasting blood glucose: 91 mg/dl

[Series 3: ct wb 5.0 b30f · axial · 5.0mm · 0.98mm/px · 1 of 290 slices shown]
[im 290/290  brain]
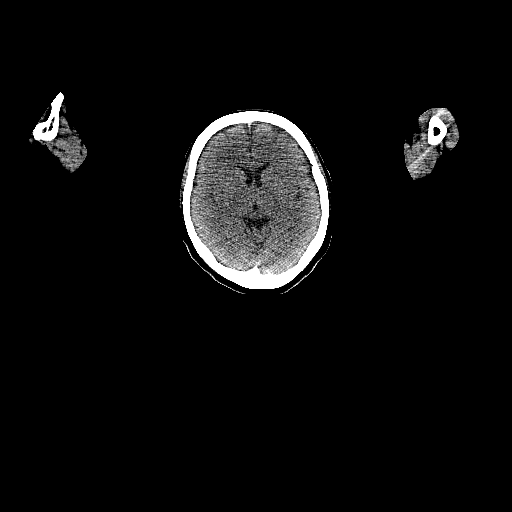

[1 of 25 positions shown; findings below may reference images not displayed]

FINDINGS: Mediastinal blood pool activity: SUV max

Liver activity: SUV max

NECK: No neck mass or lymphadenopathy.

Incidental CT findings: none

CHEST: No breast masses, supraclavicular or axillary adenopathy.

No enlarged or hypermetabolic mediastinal or hilar lymph nodes.

There are patchy bilateral lung infiltrates, improved when compared
to the prior chest CT from 04/18/2020 consistent with clearing COVID
pneumonia. The somewhat rounded infiltrate in the right lower lobe
is the most hypermetabolic. SUV max is 8.40. No pleural effusions.

Incidental CT findings: none

ABDOMEN/PELVIS: No residual/recurrent adenopathy the or
hypermetabolism. Regressing matted soft tissue density consistent
with treated disease.

The solid abdominal organs are unremarkable. No splenomegaly or
splenic lesions.

No inguinal adenopathy.

Incidental CT findings: Stable surgical changes near the cecum.

SKELETON: No osseous abnormalities.

Incidental CT findings: none
IMPRESSION: 1. No findings suspicious for residual or recurrent lymphoma.
[HOSPITAL] 1.
2. Regressing matted soft tissue density in the mesentery and
retroperitoneum consistent with treated disease.
3. Hypermetabolic pulmonary infiltrates consistent with COVID
pneumonia.

## 2021-08-12 ENCOUNTER — Encounter: Payer: Self-pay | Admitting: Internal Medicine

## 2021-08-12 ENCOUNTER — Emergency Department
Admission: EM | Admit: 2021-08-12 | Discharge: 2021-08-12 | Disposition: A | Discharge status: Home / Self Care | Payer: Worker's Compensation | Attending: Emergency Medicine | Admitting: Emergency Medicine

## 2021-08-12 ENCOUNTER — Emergency Department: Payer: Worker's Compensation

## 2021-08-12 ENCOUNTER — Other Ambulatory Visit: Payer: Self-pay

## 2021-08-12 ENCOUNTER — Encounter: Payer: Self-pay | Admitting: Emergency Medicine

## 2021-08-12 DIAGNOSIS — S92502B Displaced unspecified fracture of left lesser toe(s), initial encounter for open fracture: Secondary | ICD-10-CM

## 2021-08-12 DIAGNOSIS — Z23 Encounter for immunization: Secondary | ICD-10-CM | POA: Diagnosis not present

## 2021-08-12 DIAGNOSIS — S92522B Displaced fracture of medial phalanx of left lesser toe(s), initial encounter for open fracture: Secondary | ICD-10-CM | POA: Diagnosis not present

## 2021-08-12 DIAGNOSIS — S9782XA Crushing injury of left foot, initial encounter: Secondary | ICD-10-CM

## 2021-08-12 DIAGNOSIS — S92532A Displaced fracture of distal phalanx of left lesser toe(s), initial encounter for closed fracture: Secondary | ICD-10-CM | POA: Insufficient documentation

## 2021-08-12 DIAGNOSIS — S92502A Displaced unspecified fracture of left lesser toe(s), initial encounter for closed fracture: Secondary | ICD-10-CM

## 2021-08-12 DIAGNOSIS — Y990 Civilian activity done for income or pay: Secondary | ICD-10-CM | POA: Diagnosis not present

## 2021-08-12 DIAGNOSIS — I10 Essential (primary) hypertension: Secondary | ICD-10-CM | POA: Diagnosis not present

## 2021-08-12 DIAGNOSIS — W208XXA Other cause of strike by thrown, projected or falling object, initial encounter: Secondary | ICD-10-CM | POA: Insufficient documentation

## 2021-08-12 DIAGNOSIS — Y93E5 Activity, floor mopping and cleaning: Secondary | ICD-10-CM | POA: Diagnosis not present

## 2021-08-12 DIAGNOSIS — S99922A Unspecified injury of left foot, initial encounter: Secondary | ICD-10-CM | POA: Diagnosis present

## 2021-08-12 DIAGNOSIS — S91215A Laceration without foreign body of left lesser toe(s) with damage to nail, initial encounter: Secondary | ICD-10-CM | POA: Insufficient documentation

## 2021-08-12 MED ORDER — CEPHALEXIN 500 MG PO CAPS
500.0000 mg | ORAL_CAPSULE | Freq: Once | ORAL | Status: AC
  Administered 2021-08-12: 500 mg via ORAL
  Filled 2021-08-12: qty 1

## 2021-08-12 MED ORDER — TETANUS-DIPHTH-ACELL PERTUSSIS 5-2.5-18.5 LF-MCG/0.5 IM SUSY
0.5000 mL | PREFILLED_SYRINGE | Freq: Once | INTRAMUSCULAR | Status: AC
Start: 2021-08-12 — End: 2021-08-12
  Administered 2021-08-12: 0.5 mL via INTRAMUSCULAR
  Filled 2021-08-12: qty 0.5

## 2021-08-12 MED ORDER — ACETAMINOPHEN 500 MG PO TABS
1000.0000 mg | ORAL_TABLET | Freq: Once | ORAL | Status: AC
  Administered 2021-08-12: 1000 mg via ORAL
  Filled 2021-08-12: qty 2

## 2021-08-12 MED ORDER — BUPIVACAINE HCL 0.5 % IJ SOLN
50.0000 mL | Freq: Once | INTRAMUSCULAR | Status: DC
  Filled 2021-08-12: qty 50

## 2021-08-12 MED ORDER — CEFAZOLIN SODIUM 1 G IM
1.0000 g | Freq: Once | INTRAMUSCULAR | Status: AC
  Administered 2021-08-12: 1 g via INTRAMUSCULAR
  Filled 2021-08-12: qty 10

## 2021-08-12 MED ORDER — LIDOCAINE HCL (PF) 1 % IJ SOLN
10.0000 mL | Freq: Once | INTRAMUSCULAR | Status: AC
Start: 2021-08-12 — End: 2021-08-12
  Administered 2021-08-12: 10 mL
  Filled 2021-08-12: qty 10

## 2021-08-12 MED ORDER — OXYCODONE HCL 5 MG PO TABS: 5.0000 mg | ORAL_TABLET | Freq: Four times a day (QID) | ORAL | 0 refills | Status: DC | PRN

## 2021-08-12 MED ORDER — IBUPROFEN 600 MG PO TABS
600.0000 mg | ORAL_TABLET | Freq: Once | ORAL | Status: AC
  Administered 2021-08-12: 600 mg via ORAL
  Filled 2021-08-12: qty 1

## 2021-08-12 MED ORDER — BUPIVACAINE HCL 0.5 % IJ SOLN
30.0000 mL | Freq: Once | INTRAMUSCULAR | Status: AC
  Administered 2021-08-12: 30 mL

## 2021-08-12 MED ORDER — CEPHALEXIN 500 MG PO CAPS: 500.0000 mg | ORAL_CAPSULE | Freq: Four times a day (QID) | ORAL | 0 refills | Status: DC

## 2021-08-12 NOTE — ED Notes (Signed)
Dressing nonstick with wrap done.  Post op shoe applied over large sock.  Crutches fitted and instructions given.  Patient ambulated out on crutches.   ?

## 2021-08-12 NOTE — ED Provider Notes (Signed)
[facility] Provider Note    Event Date/Time   First MD Initiated Contact with Patient 08/12/21 1013     (approximate)   History   Foot Injury   HPI Spanish interpreter [employer] plus interpreter present at [facility] both were used. Kim Contreras is a 61 y.o. female   presents to the ED with a Workmen's Comp. injury to her left foot.  Patient states that she was mopping the floor when a metal object weighing approximately 20 pounds per safety manager fell onto her foot.  Patient was wearing tennis shoes and did not have protection from this amount of weight.  Patient is having pain in her toes especially the second and third toe.  She denies any other injuries.  He is uncertain of her tetanus immunization.  Patient has a history of diverticulosis, anxiety, H. pylori gastritis and hypertension.  Currently she rates her pain as 10/10.      Physical Exam   Triage Vital Signs: ED Triage Vitals  Enc Vitals Group     BP 08/12/21 0949 (!) 169/103     Pulse Rate 08/12/21 0947 70     Resp 08/12/21 0947 16     Temp 08/12/21 0947 98.5 F (36.9 C)     Temp Source 08/12/21 0947 Oral     SpO2 08/12/21 0947 100 %     Weight 08/12/21 0948 135 lb (61.2 kg)     Height 08/12/21 0948 5' (1.524 m)     Head Circumference --      Peak Flow --      Pain Score 08/12/21 0948 10     Pain Loc --      Pain Edu? --      Excl. in [place]? --     Most recent vital signs: Vitals:   08/12/21 0949 08/12/21 1354  BP: (!) 169/103 (!) 153/88  Pulse:  64  Resp:  16  Temp:    SpO2:       General: Awake, no distress.  Obviously uncomfortable but is able to talk in complete sentences and answers questions appropriately. CV:  Good peripheral perfusion.  Resp:  Normal effort.  Abd:  No distention.  Other:  Left foot with moderate tenderness on palpation of the second digit without skin injury.  Moderate soft tissue edema.  Patient reports sensory perception but range of motion in the  second and third digit was not noted either due to patient's pain or more likely tendon injury.  The third digit has 2 lacerations at the base of the nail bilaterally.  Nail is still intact.  On examination and there is moderate soft tissue edema however with palpation it is obvious that there is very loose mobility to suggest a crush type injury to the bone as the entire digit feels "flimsy".  No point tenderness is noted on the remainder of the left foot.   ED Results / Procedures / Treatments   Labs (all labs ordered are listed, but only abnormal results are displayed) Labs Reviewed - No data to display    RADIOLOGY  Left foot x-ray images were reviewed and interpreted by this provider shows a fracture to the second digit middle phalanx transverse fracture and also a small chip fracture off the proximal aspect of the distal phalanx. Third digit fracture with severely comminuted injury.  No foreign bodies are noted. Radiology report confirms transverse fracture of the second digit and also comminuted fracture of the third digit.  No foreign body noted.  PROCEDURES:  Critical Care performed:   Marland KitchenMarland KitchenLaceration Repair  Date/Time: 08/12/2021 12:01 PM Performed by: Johnn Hai, PA-C Authorized by: Johnn Hai, PA-C   Consent:    Consent obtained:  Verbal   Consent given by:  Patient   Risks discussed:  Infection, pain, poor wound healing, tendon damage and nerve damage Universal protocol:    Patient identity confirmed:  Verbally with patient Anesthesia:    Anesthesia method:  Nerve block   Block needle gauge:  25 G   Block anesthetic:  Lidocaine 1% w/o epi and bupivacaine 0.5% w/o epi   Block injection procedure:  Anatomic landmarks identified, introduced needle, incremental injection and negative aspiration for blood   Block outcome:  Incomplete block Laceration details:    Location:  Toe   Toe location:  L third toe   Length (cm):  1 Pre-procedure details:     Preparation:  Imaging obtained to evaluate for foreign bodies and patient was prepped and draped in usual sterile fashion Exploration:    Hemostasis achieved with:  Direct pressure   Imaging obtained: x-ray     Imaging outcome: foreign body not noted     Contaminated: no   Treatment:    Area cleansed with:  Saline and povidone-iodine   Amount of cleaning:  Extensive   Irrigation solution:  Sterile saline   Irrigation method:  Syringe   Visualized foreign bodies/material removed: no     Debridement:  None Skin repair:    Repair method:  Sutures   Suture size:  4-0   Suture material:  Nylon   Suture technique:  Simple interrupted and running   Number of sutures:  4 Approximation:    Approximation:  Close Repair type:    Repair type:  Simple Post-procedure details:    Dressing:  Non-adherent dressing   Procedure completion:  Tolerated well, no immediate complications   MEDICATIONS ORDERED IN ED: Medications  acetaminophen (TYLENOL) tablet 1,000 mg (1,000 mg Oral Given 08/12/21 1022)  ibuprofen (ADVIL) tablet 600 mg (600 mg Oral Given 08/12/21 1022)  Tdap (BOOSTRIX) injection 0.5 mL (0.5 mLs Intramuscular Given 08/12/21 1136)  lidocaine (PF) (XYLOCAINE) 1 % injection 10 mL (10 mLs Infiltration Given 08/12/21 1136)  cephALEXin (KEFLEX) capsule 500 mg (500 mg Oral Given 08/12/21 1127)  bupivacaine (MARCAINE) 0.5 % (with pres) injection 30 mL (30 mLs Infiltration Given by Other 08/12/21 1248)  ceFAZolin (ANCEF) injection 1 g (1 g Intramuscular Given 08/12/21 1343)     IMPRESSION / MDM / [place] / ED COURSE  I reviewed the triage vital signs and the nursing notes.   Differential diagnosis includes, but is not limited to, fracture second and third digit of the left foot, dislocation third toe, crush injury,  61 year old female presents to the ED with a Workmen's Comp. injury in which her left second and third toe was crushed by a 20 pound metal object.  Safety officer is  present and is aware of my concerns and also the extent of injury to her foot.  Kennyth Lose Spanish interpreter here at [facility] was in to talk with the patient along with using [employer] to discuss what needs to be done.  Patient tolerated suturing well.  Prior to suturing I did speak with Dr. Luana Shu who is on-call for podiatry.  He reviewed the x-rays and will see her as follow-up in the office.  Ancef 1 g IM was given, Tdap given, a prescription for Keflex to continue  taking and also oxycodone 5 mg every 6 hours as needed for pain.  A dressing was applied and patient was put in a postop shoe.  She is aware that she needs to elevate her foot to reduce swelling and help with pain.  A note was written for her to remain out of work with a notation that she is to see Dr. Luana Shu before any determination is made.   FINAL CLINICAL IMPRESSION(S) / ED DIAGNOSES   Final diagnoses:  Crush injury of left foot, initial encounter  Open fracture of phalanx of left third toe, initial encounter  Closed fracture of phalanx of left second toe, initial encounter     Rx / DC Orders   ED Discharge Orders          Ordered    cephALEXin (KEFLEX) 500 MG capsule  4 times daily        08/12/21 1347    oxyCODONE (OXY IR/ROXICODONE) 5 MG immediate release tablet  Every 6 hours PRN        08/12/21 1347             Note:  This document was prepared using Dragon voice recognition software and may include unintentional dictation errors.   Johnn Hai, PA-C 08/12/21 1612    Carrie Mew, MD 08/16/21 [phone]

## 2021-08-12 NOTE — ED Triage Notes (Signed)
Pt to ED via POV for left foot injury. Pt states that she was at work mopping the floor and a metal object landed on her toe. Pt has bleeding to the left 3rd toe. Pt is also having some pain in her toes. Pt is in NAD. ?

## 2021-08-12 NOTE — ED Provider Triage Note (Signed)
Emergency Medicine Provider Triage Evaluation Note ? ?Kim Contreras , a 61 y.o. female  was evaluated in triage.  Pt complains of left third toe pain after a metal cabinet or something fell onto her toe while she was cleaning.. ? ?Review of Systems  ?Positive:  ?Negative:  ? ?Physical Exam  ?Pulse 70   Temp 98.5 ?F (36.9 ?C) (Oral)   Resp 16   LMP 01/18/2005   SpO2 100%  ?Gen:   Awake, no distress   ?Resp:  Normal effort  ?MSK:   Moves extremities without difficulty  ?Other:   ? ?Medical Decision Making  ?Medically screening exam initiated at 9:49 AM.  Appropriate orders placed.  Kim Contreras was informed that the remainder of the evaluation will be completed by another provider, this initial triage assessment does not replace that evaluation, and the importance of remaining in the ED until their evaluation is complete. ? ? ?  ?Vladimir Crofts, MD ?08/12/21 [phone] ? ?

## 2021-08-12 NOTE — Discharge Instructions (Signed)
Call make an appointment with Dr. Deborra Medina office who is the podiatrist on-call.  Keep area clean and dry.  Elevate foot to decrease swelling.  No working until seen by Dr. Luana Shu and his recommendation. ?

## 2021-08-15 ENCOUNTER — Encounter: Payer: Self-pay | Admitting: Internal Medicine

## 2021-08-17 ENCOUNTER — Telehealth: Payer: Self-pay

## 2021-08-17 NOTE — Telephone Encounter (Signed)
Patient called in to say  she  missed a call through an interpreter. Called back and used interpreter # [phone]. No notes on patients chart to indicate a call to her. She is taking pain medicine and is waiting for a call to schedule a follow up from [employer].

## 2021-08-30 ENCOUNTER — Ambulatory Visit: Payer: Commercial Managed Care - PPO | Admitting: Cardiology

## 2021-08-30 ENCOUNTER — Encounter: Payer: Self-pay | Admitting: Cardiology

## 2021-08-30 ENCOUNTER — Ambulatory Visit (INDEPENDENT_AMBULATORY_CARE_PROVIDER_SITE_OTHER): Payer: Commercial Managed Care - PPO

## 2021-08-30 VITALS — BP 126/88 | HR 68 | Ht 60.0 in | Wt 139.4 lb

## 2021-08-30 DIAGNOSIS — R072 Precordial pain: Secondary | ICD-10-CM | POA: Diagnosis not present

## 2021-08-30 DIAGNOSIS — I10 Essential (primary) hypertension: Secondary | ICD-10-CM

## 2021-08-30 DIAGNOSIS — R002 Palpitations: Secondary | ICD-10-CM

## 2021-08-30 NOTE — Patient Instructions (Signed)
Medication Instructions:   Your physician recommends that you continue on your current medications as directed. Please refer to the Current Medication list given to you today.  *If you need a refill on your cardiac medications before your next appointment, please call your pharmacy*   Testing/Procedures:  Your physician has requested that you have an echocardiogram in 3 weeks. Echocardiography is a painless test that uses sound waves to create images of your heart. It provides your doctor with information about the size and shape of your heart and how well your heart's chambers and valves are working. This procedure takes approximately one hour. There are no restrictions for this procedure.  2.    Your physician has recommended that you wear a Zio XT monitor for 2 weeks. This will be mailed to your home address in 4-5 business days.   This monitor is a medical device that records the heart's electrical activity. Doctors most often use these monitors to diagnose arrhythmias. Arrhythmias are problems with the speed or rhythm of the heartbeat. The monitor is a small device applied to your chest. You can wear one while you do your normal daily activities. While wearing this monitor if you have any symptoms to push the button and record what you felt. Once you have worn this monitor for the period of time provider prescribed (Usually 14 days), you will return the monitor device in the postage paid box. Once it is returned they will download the data collected and provide [place] with a report which the provider will then review and we will call you with those results. Important tips:  Avoid showering during the first 24 hours of wearing the monitor. Avoid excessive sweating to help maximize wear time. Do not submerge the device, no hot tubs, and no swimming pools. Keep any lotions or oils away from the patch. After 24 hours you may shower with the patch on. Take brief showers with your back facing the  shower head.  Do not remove patch once it has been placed because that will interrupt data and decrease adhesive wear time. Push the button when you have any symptoms and write down what you were feeling. Once you have completed wearing your monitor, remove and place into box which has postage paid and place in your outgoing mailbox.  If for some reason you have misplaced your box then call our office and we can provide another box and/or mail it off for you.     Follow-Up: At [facility], you and your health needs are our priority.  As part of our continuing mission to provide you with exceptional heart care, we have created designated Provider Care Teams.  These Care Teams include your primary Cardiologist (physician) and Advanced Practice Providers (APPs -  Physician Assistants and Nurse Practitioners) who all work together to provide you with the care you need, when you need it.  We recommend signing up for the patient portal called "MyChart".  Sign up information is provided on this After Visit Summary.  MyChart is used to connect with patients for Virtual Visits (Telemedicine).  Patients are able to view lab/test results, encounter notes, upcoming appointments, etc.  Non-urgent messages can be sent to your provider as well.   To learn more about what you can do with MyChart, go to [email or web address].    Your next appointment:   6-8 week(s)  The format for your next appointment:   In Person  Provider:   You may see Kate Sable, MD or  one of the following Advanced Practice Providers on your designated Care Team:   Murray Hodgkins, NP Christell Faith, PA-C Cadence Kathlen Mody, [place]    Other Instructions   Important Information About [place]

## 2021-08-30 NOTE — Progress Notes (Signed)
Cardiology Office Note:    Date:  08/30/2021   ID:  Kim Contreras, DOB Jan 29, 1961, MRN [id number]  PCP:  [facility] Prov[facility] Cardiologist:  None     Referring MD: [place]   No chief complaint on file.  Kim Contreras is a 61 y.o. female who is being seen today for the evaluation of chest pain at the request of [facility] [place]   History of Present Illness:    Kim Contreras is a 61 y.o. female with a hx of hypertension, GERD who presents due to chest pain and palpitations.  She states having symptoms of palpitations ongoing over the past 3 months.  Symptoms are not associated with exertion.  Symptoms occur daily lasting a few minutes associated with chest pain.  Denies smoking.  Recently injured her left foot by dropping a 10 pound weight on it.  She may require amputation on 1 over toes.  His blood pressure was elevated after her injury.  Endorse getting out of breath when any tight/closed spaces.  Past Medical History:  Diagnosis Date   Anemia 2006   Anxiety    Arthritis    Diverticulosis    Helicobacter pylori gastritis    History of hiatal hernia    Hypertension    Lymphoma ([place])     Past Surgical History:  Procedure Laterality Date   APPENDECTOMY     CHOLECYSTECTOMY N/A 01/19/2015   Procedure: LAPAROSCOPIC CHOLECYSTECTOMY;  [name], MD;  [name];  Service: General;  [name];   COLONOSCOPY WITH PROPOFOL N/A 10/27/2014   Procedure: COLONOSCOPY WITH PROPOFOL;  [name], MD;  Location: [facility] ENDOSCOPY;  Service: Endoscopy;  [name];   COLONOSCOPY WITH PROPOFOL N/A 02/05/2021   Procedure: COLONOSCOPY WITH PROPOFOL;  [name], MD;  [name];  Service: Endoscopy;  [name];   ESOPHAGOGASTRODUODENOSCOPY     ESOPHAGOGASTRODUODENOSCOPY (EGD) WITH PROPOFOL N/A 02/05/2021   Procedure:  ESOPHAGOGASTRODUODENOSCOPY (EGD) WITH PROPOFOL;  [name], MD;  [name];  Service: Endoscopy;  [name];   EXCISION MASS ABDOMINAL N/A 06/03/2019   Procedure: EXCISION MASS ABDOMINAL;  [name], MD;  [name];  Service: General;  [name];  abdominal node biopsy   NASAL SINUS SURGERY     PORT-A-CATH REMOVAL  09/2020   by Dr. Towanda Malkin PLACEMENT Left 07/08/2019   Procedure: INSERTION PORT-A-CATH;  [name], MD;  [name];  Service: General;  Laterality: Left;   TUBAL LIGATION      Current Medications: Current Meds  Medication Sig   albuterol (VENTOLIN HFA) 108 (90 Base) MCG/ACT inhaler Inhale into the lungs.   gabapentin (NEURONTIN) 300 MG capsule Take 600 mg by mouth 2 (two) times daily.   levocetirizine (XYZAL) 5 MG tablet Take 5 mg by mouth every evening.   losartan (COZAAR) 50 MG tablet Take 50 mg by mouth daily.   montelukast (SINGULAIR) 10 MG tablet Take 10 mg by mouth every evening.   pantoprazole (PROTONIX) 40 MG tablet Take 1 tablet (40 mg total) by mouth daily.     Allergies:   Chlorhexidine and Pseudoephedrine hcl   Social History   Socioeconomic History   Marital status: Married    Spouse name: Not on file   Number of children: Not on file   Years of education: Not on file  Highest education level: Not on file  Occupational History   Not on file  Tobacco Use   Smoking status: Never   Smokeless tobacco: Never  Vaping Use   Vaping Use: Never used  Substance and Sexual Activity   Alcohol use: No   Drug use: No   Sexual activity: Not on file  Other Topics Concern   Not on file  Social History Narrative   Not on file   Social Determinants of Health   Financial Resource Strain: Not on file  Food Insecurity: Not on file  Transportation Needs: Not on file  Physical Activity: Not on file  Stress: Not on file  Social Connections: Not on file      Family History: The patient's family history includes Breast cancer (age of onset: 60) in an other family member. There is no history of Prostate cancer, Bladder Cancer, or Kidney cancer.  ROS:   Please see the history of present illness.     All other systems reviewed and are negative.  EKGs/Labs/Other Studies Reviewed:    The following studies were reviewed today:   EKG:  EKG is  ordered today.  The ekg ordered today demonstrates normal sinus rhythm  Recent Labs: 11/08/2020: TSH 1.290 06/06/2021: ALT 13; BUN 18; Creatinine, Ser 0.68; Hemoglobin 13.3; Platelets 221; Potassium 4.0; Sodium 136  Recent Lipid Panel No results found for: CHOL, TRIG, HDL, CHOLHDL, VLDL, LDLCALC, LDLDIRECT   Risk Assessment/Calculations:         Physical Exam:    VS:  BP 126/88   Pulse 68   Ht 5' (1.524 m)   Wt 139 lb 6.4 oz (63.2 kg)   LMP 01/18/2005   SpO2 98%   BMI 27.22 kg/m     Wt Readings from Last 3 Encounters:  08/30/21 139 lb 6.4 oz (63.2 kg)  08/12/21 135 lb (61.2 kg)  06/06/21 132 lb 3.2 oz (60 kg)     GEN:  Well nourished, well developed in no acute distress HEENT: Normal NECK: No JVD; No carotid bruits LYMPHATICS: No lymphadenopathy CARDIAC: RRR, no murmurs, rubs, gallops RESPIRATORY:  Clear to auscultation without rales, wheezing or rhonchi  ABDOMEN: Soft, non-tender, non-distended MUSCULOSKELETAL:  No edema; left foot in dressing SKIN: Warm and dry NEUROLOGIC:  Alert and oriented x 3 PSYCHIATRIC:  Normal affect   ASSESSMENT:    1. Precordial pain   2. Palpitations   3. Primary hypertension    PLAN:    In order of problems listed above:  Chest pain, symptoms appear atypical, associated with palpitations.  Get echocardiogram. Palpitations, there is cardiac monitor to evaluate any significant arrhythmias.  Unsure if anxiety or stress, pain contributing. Hypertension, BP controlled.  Continue losartan 50 mg daily.  Follow-up after echo and cardiac  monitor      Spanish interpreter used for this visit.  Medication Adjustments/Labs and Tests Ordered: Current medicines are reviewed at length with the patient today.  Concerns regarding medicines are outlined above.  Orders Placed This Encounter  Procedures   LONG TERM MONITOR (3-14 DAYS)   EKG 12-Lead   ECHOCARDIOGRAM COMPLETE   No orders of the defined types were placed in this encounter.   Patient Instructions  Medication Instructions:   Your physician recommends that you continue on your current medications as directed. Please refer to the Current Medication list given to you today.  *If you need a refill on your cardiac medications before your next appointment, please call your pharmacy*   Testing/Procedures:  Your  physician has requested that you have an echocardiogram in 3 weeks. Echocardiography is a painless test that uses sound waves to create images of your heart. It provides your doctor with information about the size and shape of your heart and how well your heart's chambers and valves are working. This procedure takes approximately one hour. There are no restrictions for this procedure.  2.    Your physician has recommended that you wear a Zio XT monitor for 2 weeks. This will be mailed to your home address in 4-5 business days.   This monitor is a medical device that records the heart's electrical activity. Doctors most often use these monitors to diagnose arrhythmias. Arrhythmias are problems with the speed or rhythm of the heartbeat. The monitor is a small device applied to your chest. You can wear one while you do your normal daily activities. While wearing this monitor if you have any symptoms to push the button and record what you felt. Once you have worn this monitor for the period of time provider prescribed (Usually 14 days), you will return the monitor device in the postage paid box. Once it is returned they will download the data collected and provide [place] with  a report which the provider will then review and we will call you with those results. Important tips:  Avoid showering during the first 24 hours of wearing the monitor. Avoid excessive sweating to help maximize wear time. Do not submerge the device, no hot tubs, and no swimming pools. Keep any lotions or oils away from the patch. After 24 hours you may shower with the patch on. Take brief showers with your back facing the shower head.  Do not remove patch once it has been placed because that will interrupt data and decrease adhesive wear time. Push the button when you have any symptoms and write down what you were feeling. Once you have completed wearing your monitor, remove and place into box which has postage paid and place in your outgoing mailbox.  If for some reason you have misplaced your box then call our office and we can provide another box and/or mail it off for you.     Follow-Up: At [facility], you and your health needs are our priority.  As part of our continuing mission to provide you with exceptional heart care, we have created designated Provider Care Teams.  These Care Teams include your primary Cardiologist (physician) and Advanced Practice Providers (APPs -  Physician Assistants and Nurse Practitioners) who all work together to provide you with the care you need, when you need it.  We recommend signing up for the patient portal called "MyChart".  Sign up information is provided on this After Visit Summary.  MyChart is used to connect with patients for Virtual Visits (Telemedicine).  Patients are able to view lab/test results, encounter notes, upcoming appointments, etc.  Non-urgent messages can be sent to your provider as well.   To learn more about what you can do with MyChart, go to [email or web address].    Your next appointment:   6-8 week(s)  The format for your next appointment:   In Person  Provider:   You may see Kate Sable, MD or one of the  following Advanced Practice Providers on your designated Care Team:   Murray Hodgkins, NP Christell Faith, PA-C Cadence Kathlen Mody, [place]    Other Instructions   Important Information About Sugar         Signed, Kate Sable, MD  08/30/2021 4:56  PM    [facility]

## 2021-09-03 ENCOUNTER — Ambulatory Visit
Admission: RE | Admit: 2021-09-03 | Discharge: 2021-09-03 | Disposition: A | Discharge status: Home / Self Care | Payer: Commercial Managed Care - PPO | Source: Ambulatory Visit | Attending: Internal Medicine | Admitting: Internal Medicine

## 2021-09-03 DIAGNOSIS — C8238 Follicular lymphoma grade IIIa, lymph nodes of multiple sites: Secondary | ICD-10-CM | POA: Insufficient documentation

## 2021-09-03 LAB — POCT I-STAT CREATININE: Creatinine, Ser: 0.8 mg/dL (ref 0.44–1.00)

## 2021-09-03 MED ORDER — IOHEXOL 300 MG/ML  SOLN
100.0000 mL | Freq: Once | INTRAMUSCULAR | Status: AC | PRN
  Administered 2021-09-03: 75 mL via INTRAVENOUS

## 2021-09-04 DIAGNOSIS — R002 Palpitations: Secondary | ICD-10-CM

## 2021-09-05 ENCOUNTER — Other Ambulatory Visit: Payer: Self-pay

## 2021-09-05 ENCOUNTER — Inpatient Hospital Stay: Payer: Commercial Managed Care - PPO | Admitting: Internal Medicine

## 2021-09-05 ENCOUNTER — Inpatient Hospital Stay: Payer: Commercial Managed Care - PPO | Attending: Internal Medicine

## 2021-09-05 ENCOUNTER — Encounter: Payer: Self-pay | Admitting: Internal Medicine

## 2021-09-05 DIAGNOSIS — C8238 Follicular lymphoma grade IIIa, lymph nodes of multiple sites: Secondary | ICD-10-CM

## 2021-09-05 DIAGNOSIS — I10 Essential (primary) hypertension: Secondary | ICD-10-CM | POA: Insufficient documentation

## 2021-09-05 DIAGNOSIS — Z79899 Other long term (current) drug therapy: Secondary | ICD-10-CM | POA: Diagnosis not present

## 2021-09-05 DIAGNOSIS — Z803 Family history of malignant neoplasm of breast: Secondary | ICD-10-CM | POA: Diagnosis not present

## 2021-09-05 DIAGNOSIS — B0229 Other postherpetic nervous system involvement: Secondary | ICD-10-CM | POA: Diagnosis not present

## 2021-09-05 DIAGNOSIS — G8929 Other chronic pain: Secondary | ICD-10-CM | POA: Diagnosis not present

## 2021-09-05 LAB — CBC WITH DIFFERENTIAL/PLATELET
Abs Immature Granulocytes: 0.01 10*3/uL (ref 0.00–0.07)
Basophils Absolute: 0 10*3/uL (ref 0.0–0.1)
Basophils Relative: 1 %
Eosinophils Absolute: 0.1 10*3/uL (ref 0.0–0.5)
Eosinophils Relative: 4 %
HCT: 38 % (ref 36.0–46.0)
Hemoglobin: 12.8 g/dL (ref 12.0–15.0)
Immature Granulocytes: 0 %
Lymphocytes Relative: 41 %
Lymphs Abs: 1.1 10*3/uL (ref 0.7–4.0)
MCH: 33.2 pg (ref 26.0–34.0)
MCHC: 33.7 g/dL (ref 30.0–36.0)
MCV: 98.4 fL (ref 80.0–100.0)
Monocytes Absolute: 0.3 10*3/uL (ref 0.1–1.0)
Monocytes Relative: 11 %
Neutro Abs: 1.2 10*3/uL — ABNORMAL LOW (ref 1.7–7.7)
Neutrophils Relative %: 43 %
Platelets: 238 10*3/uL (ref 150–400)
RBC: 3.86 MIL/uL — ABNORMAL LOW (ref 3.87–5.11)
RDW: 13.4 % (ref 11.5–15.5)
WBC: 2.7 10*3/uL — ABNORMAL LOW (ref 4.0–10.5)
nRBC: 0 % (ref 0.0–0.2)

## 2021-09-05 LAB — COMPREHENSIVE METABOLIC PANEL
ALT: 21 U/L (ref 0–44)
AST: 17 U/L (ref 15–41)
Albumin: 4 g/dL (ref 3.5–5.0)
Alkaline Phosphatase: 79 U/L (ref 38–126)
Anion gap: 7 (ref 5–15)
BUN: 19 mg/dL (ref 6–20)
CO2: 28 mmol/L (ref 22–32)
Calcium: 9.1 mg/dL (ref 8.9–10.3)
Chloride: 103 mmol/L (ref 98–111)
Creatinine, Ser: 0.72 mg/dL (ref 0.44–1.00)
GFR, Estimated: >60 mL/min (ref >60)
Glucose, Bld: 101 mg/dL — ABNORMAL HIGH (ref 70–99)
Potassium: 3.8 mmol/L (ref 3.5–5.1)
Sodium: 138 mmol/L (ref 135–145)
Total Bilirubin: 0.7 mg/dL (ref 0.3–1.2)
Total Protein: 7.4 g/dL (ref 6.5–8.1)

## 2021-09-05 LAB — LACTATE DEHYDROGENASE: LDH: 104 U/L (ref 98–192)

## 2021-09-05 MED ORDER — DULOXETINE HCL 20 MG PO CPEP: 20.0000 mg | ORAL_CAPSULE | Freq: Every day | ORAL | 3 refills | Status: DC

## 2021-09-05 NOTE — Progress Notes (Signed)
[name] NOTE  Patient Care Team: Center, [place] [facility] as PCP - General ([place]) [name], [name], MD as Consulting Physician (General Surgery)  CHIEF COMPLAINTS/PURPOSE OF CONSULTATION: Lymphoma   Oncology History Overview Note  # 5th FEB 2021- ABDOMINAL MASS-  9.3 x 3.0 cm central mesenteric soft tissue mass is identified on image 38/series 2. 3.0x 3.0 cm collar of soft tissue surrounds branches of the superior mesenteric artery and vein on image 47/2 with relatively little mass-effect on the vascular anatomy. 2.0 x 1.5 cm nodular component of this abnormal soft tissue is identified in the mesentery of the right pelvis on 55/2. FEB 2021- PET-  PET scan-February 2021-SUV around 5.5 again highly suggestive of malignancy lymphoma.  Small cluster left supra pelvic lymph node/small retroperitoneal lymph nodes-5 mm-Douville score 3.   # MARCH 4193- grade 3A follicular lymphoma [Open biopsy Dr. Burnett-] STAGE II vs III (equivocal neck lymph node) [no bone marrow bx - #Status post-Gazyva-Bendamustine x6 cycles [finished August 2021]- PET scan February, 2022-treated disease noted to have any progression.  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 ([place])   07/11/2019 -  Chemotherapy   The patient had dexamethasone (DECADRON) 4 MG tablet, 8 mg, Oral, Daily, 1 of 1 cycle, Start date: --, [name], 0.25 mg, Intravenous,  Once, 6 of 6 cycles Administration: 0.25 mg (07/11/2019), 0.25 mg (08/22/2019), 0.25 mg (09/22/2019), 0.25 mg (11/28/2019), 0.25 mg (01/03/2020) pegfilgrastim-cbqv (UDENYCA) injection 6 mg, 6 mg, Subcutaneous, Once, 3 of 3 cycles Administration: 6 mg (11/30/2019), 6 mg  (01/05/2020), 6 mg (02/13/2020) bendamustine (BENDEKA) 150 mg in sodium chloride 0.9 % 50 mL (2.6786 mg/mL) chemo infusion, 90 mg/m2 = 150 mg, Intravenous,  Once, [name]50 mg (07/11/2019), 150 mg (07/12/2019), 150 mg (08/22/2019), 150 mg (08/24/2019), 150 mg (09/22/2019), 150 mg (09/23/2019), 150 mg (11/28/2019), 150 mg (11/29/2019), 150 mg (01/03/2020), 150 mg (01/04/2020), 150 mg (02/10/2020) obinutuzumab (GAZYVA) 1,[name], 1,000 mg, Intravenous, Once, [name],000 mg (07/11/2019), 1,000 mg (07/18/2019), 1,000 mg (08/22/2019), 1,000 mg (07/27/2019), 1,000 mg (09/22/2019), 1,000 mg (11/28/2019), 1,000 mg (01/03/2020)  for chemotherapy treatment.       HISTORY OF PRESENTING ILLNESS: Patient speaks minimal English/patient daughter Kim Contreras in office.  Kim Contreras 61 y.o.  female with follicular lymphoma grade 3 status post Gazyva-Benda currently on surveillance is here for follow-up.  Last chemotherapy approximately year ago-February 2022.  The patient complains of worsening pain of her right flank area/previous diagnosis of shingles.  Patient is currently on gabapentin 600 mg twice a day.  No further episodes of pneumonia. She continues to complain of chronic fatigue.    Unfortunately patient had a trauma to her left foot-and is currently in the process of being evaluated for toe amputation.  She is quite stressed difficulty sleeping  Review of Systems  Constitutional:  Positive for malaise/fatigue. Negative for chills, diaphoresis, [name], hemoptysis, sputum production, [name], palpitations, orthopnea and  leg swelling.  Gastrointestinal:  Positive for constipation. Negative for blood in stool,  diarrhea, heartburn, melena and vomiting.  Musculoskeletal:  Positive for back pain and joint pain.  Skin: Negative.  Negative for itching and rash.  Neurological:  Positive for tingling. Negative for dizziness, focal weakness and weakness.  Endo/Heme/Allergies:  Does not bruise/bleed easily.  Psychiatric/Behavioral:  Negative for depression. The patient has insomnia. The patient is not nervous/anxious.      MEDICAL HISTORY:  Past Medical History:  Diagnosis Date  . Anemia 2006  . Anxiety   . Arthritis   . Diverticulosis   . Helicobacter pylori gastritis   . History of hiatal hernia   . Hypertension   . Lymphoma ([place])     SURGICAL HISTORY: Past Surgical History:  Procedure Laterality Date  . APPENDECTOMY    . CHOLECYSTECTOMY N/A 01/19/2015   Procedure: LAPAROSCOPIC CHOLECYSTECTOMY;  [name], MD;  [name];  Service: General;  [name];  . COLONOSCOPY WITH PROPOFOL N/A 10/27/2014   Procedure: COLONOSCOPY WITH PROPOFOL;  [name], MD;  Location: [facility] ENDOSCOPY;  Service: Endoscopy;  [name];  . COLONOSCOPY WITH PROPOFOL N/A 02/05/2021   Procedure: COLONOSCOPY WITH PROPOFOL;  [name], MD;  [name];  Service: Endoscopy;  [name];  . ESOPHAGOGASTRODUODENOSCOPY    . ESOPHAGOGASTRODUODENOSCOPY (EGD) WITH PROPOFOL N/A 02/05/2021   Procedure: ESOPHAGOGASTRODUODENOSCOPY (EGD) WITH PROPOFOL;  [name], MD;  [name];  Service: Endoscopy;  [name];  . EXCISION MASS ABDOMINAL N/A 06/03/2019   Procedure: EXCISION MASS ABDOMINAL;  [name], MD;  [name];  Service: General;  [name];  abdominal node biopsy  . NASAL SINUS SURGERY    . PORT-A-CATH REMOVAL  09/2020   by Dr. Bary Castilla  . PORTACATH PLACEMENT Left 07/08/2019   Procedure: INSERTION PORT-A-CATH;  [name], MD;  Location: [facility]  ORS;  Service: General;  Laterality: Left;  . TUBAL LIGATION      SOCIAL HISTORY: Social History   Socioeconomic History  . Marital status: Married    Spouse name: Not on file  . Number of children: Not on file  . Years of education: Not on file  . Highest education level: Not on file  Occupational History  . Not on file  Tobacco Use  . Smoking status: Never  . Smokeless tobacco: Never  Vaping Use  . Vaping Use: Never used  Substance and Sexual Activity  . Alcohol use: No  . Drug use: No  . Sexual activity: Not on file  Other Topics Concern  . Not on file  Social History Narrative  . Not on file   Social Determinants of Health   Financial Resource Strain: Not on file  Food Insecurity: Not on file  Transportation Needs: Not on file  Physical Activity: Not on file  Stress: Not on file  Social Connections: Not on file  Intimate Partner Violence: Not on file    FAMILY HISTORY: Family History  Problem Relation Age of Onset  . Breast cancer Other 42  . Prostate cancer Neg Hx   . Bladder Cancer Neg Hx   . Kidney cancer Neg Hx     ALLERGIES:  is allergic to chlorhexidine and pseudoephedrine hcl.  MEDICATIONS:  Current Outpatient Medications  Medication Sig Dispense Refill  . albuterol (VENTOLIN HFA) 108 (90 Base) MCG/ACT inhaler Inhale into the lungs.    . DULoxetine (CYMBALTA) 20 MG  capsule Take 1 capsule (20 mg total) by mouth daily. 30 capsule 3  . gabapentin (NEURONTIN) 300 MG capsule Take 600 mg by mouth 2 (two) times daily.    Marland Kitchen levocetirizine (XYZAL) 5 MG tablet Take 5 mg by mouth every evening.    Marland Kitchen losartan (COZAAR) 50 MG tablet Take 50 mg by mouth daily.    . montelukast (SINGULAIR) 10 MG tablet Take 10 mg by mouth every evening.    . pantoprazole (PROTONIX) 40 MG tablet Take 1 tablet (40 mg total) by mouth daily. 30 tablet 3   No current facility-administered medications for this visit.   Facility-Administered Medications Ordered in Other Visits   Medication Dose Route Frequency Provider Last Rate Last Admin  . sodium chloride flush (NS) 0.9 % injection 10 mL  10 mL Intravenous PRN Charlaine Dalton R, MD   10 mL at 12/28/19 0815       PHYSICAL EXAMINATION: ECOG PERFORMANCE STATUS: 0 - Asymptomatic  Vitals:   09/05/21 0900  BP: 121/79  Pulse: 66  Resp: 16  Temp: 98.4 F (36.9 C)   Filed Weights   09/05/21 0900  Weight: 138 lb 8 oz (62.8 kg)   Patient in wheelchair because of left foot trauma.  Physical Exam HENT:     Head: Normocephalic and atraumatic.     Mouth/Throat:     Pharynx: No oropharyngeal exudate.  Eyes:     Pupils: Pupils are equal, round, and reactive to light.  Cardiovascular:     Rate and Rhythm: Normal rate and regular rhythm.  Pulmonary:     Effort: Pulmonary effort is normal. No respiratory distress.     Breath sounds: Normal breath sounds. No wheezing.  Abdominal:     General: Bowel sounds are normal. There is no distension.     Palpations: Abdomen is soft. There is no mass.     Tenderness: There is no abdominal tenderness. There is no guarding or rebound.  Musculoskeletal:        General: No tenderness. Normal range of motion.     Cervical back: Normal range of motion and neck supple.     Comments: Mild swelling of the left lower extremity; mild tenderness noted.  Skin:    General: Skin is warm.  Neurological:     Mental Status: She is alert and oriented to person, place, and time.  Psychiatric:        Mood and Affect: Affect normal.     LABORATORY DATA:  I have reviewed the data as listed Lab Results  Component Value Date   WBC 2.7 (L) 09/05/2021   HGB 12.8 09/05/2021   HCT 38.0 09/05/2021   MCV 98.4 09/05/2021   PLT 238 09/05/2021   Recent Labs    02/07/21 0952 04/29/21 2030 06/06/21 0937 09/03/21 0844 09/05/21 0927  NA 139 137 136  --  138  K 3.8 4.0 4.0  --  3.8  CL 105 103 103  --  103  CO2 '25 26 26  '$ --  28  GLUCOSE 103* 105* 96  --  101*  BUN '9 14 18  '$ --   19  CREATININE 0.69 0.79 0.68 0.80 0.72  CALCIUM 8.8* 9.4 9.1  --  9.1  GFRNONAA >60 >60 >60  --  >60  PROT 7.3  --  7.2  --  7.4  ALBUMIN 3.6  --  4.2  --  4.0  AST 17  --  13*  --  17  ALT 22  --  13  --  21  ALKPHOS 130*  --  72  --  79  BILITOT 0.4  --  0.8  --  0.7    RADIOGRAPHIC STUDIES: I have personally reviewed the radiological images as listed and agreed with the findings in the report. CT CHEST ABDOMEN PELVIS W CONTRAST  Result Date: 09/04/2021 CLINICAL DATA:  Follicular lymphoma. Completed chemotherapy in 2021. Restaging assessment. * Tracking Code: BO * EXAM: CT CHEST, ABDOMEN, AND PELVIS WITH CONTRAST TECHNIQUE: Multidetector CT imaging of the chest, abdomen and pelvis was performed following the standard protocol during bolus administration of intravenous contrast. RADIATION DOSE REDUCTION: This exam was performed according to the departmental dose-optimization program which includes automated exposure control, adjustment of the mA and/or kV according to patient size and/or use of iterative reconstruction technique. CONTRAST:  42m OMNIPAQUE IOHEXOL 300 MG/ML  SOLN COMPARISON:  Multiple exams, including CTA chest 01/28/2021 and PET-CT from 05/08/2020 FINDINGS: CT CHEST FINDINGS Cardiovascular: Atherosclerotic calcification of the left subclavian artery proximally. Mediastinum/Nodes: Unremarkable Lungs/Pleura: Calcified granuloma in the left upper lobe compatible with old granulomatous disease. Musculoskeletal: Unremarkable CT ABDOMEN PELVIS FINDINGS Hepatobiliary: Cholecystectomy. Common bile duct 0.7 cm in diameter, mild prominence probably a physiologic response to cholecystectomy. Pancreas: Unremarkable Spleen: Unremarkable Adrenals/Urinary Tract: Unremarkable Stomach/Bowel: Descending colon diverticulosis. Vascular/Lymphatic: No pathologic adenopathy. Similar appearance of stranding in the central mesentery compatible with treated lymphoma. No new or progressive adenopathy.  Reproductive: Lobularity along the right posterior uterine body compatible with uterine fibroid. Other: No supplemental non-categorized findings. Musculoskeletal: Mild right foraminal stenosis at L5-S1 due to facet spurring. Small supraumbilical hernias contain adipose tissue. IMPRESSION: 1. No findings of adenopathy or active lymphoma. Stable stranding in the central mesentery compatible with treated lymphoma. 2. Other imaging findings of potential clinical significance: Descending colon diverticulosis. Uterine fibroid. Right foraminal stenosis at L5-S1 due to facet spurring. Small supraumbilical hernias contain adipose tissue. Electronically Signed   By: WVan ClinesM.D.   On: 09/04/2021 09:33   CT SOFT TISSUE NECK W CONTRAST  Result Date: 09/04/2021 CLINICAL DATA:  Hematologic malignancy, assess treatment response neck swelling/lymphnodes EXAM: CT NECK WITH CONTRAST TECHNIQUE: Multidetector CT imaging of the neck was performed using the standard protocol following the bolus administration of intravenous contrast. RADIATION DOSE REDUCTION: This exam was performed according to the departmental dose-optimization program which includes automated exposure control, adjustment of the mA and/or kV according to patient size and/or use of iterative reconstruction technique. CONTRAST:  736mOMNIPAQUE IOHEXOL 300 MG/ML  SOLN COMPARISON:  PET CT May 08, 2020. FINDINGS: Pharynx and larynx: No visible mass or swelling. Small left palatine tonsillith. Streak artifact from dental amalgam limits assessment. Salivary glands: No inflammation, mass, or stone. Thyroid: Subcentimeter calcified nodule in the right thyroid lobe, which is not require further imaging follow-up (ref: J Am Coll Radiol. 2015 Feb;12(2): 143-50). Lymph nodes: None enlarged or abnormal density. Vascular: Negative. Limited intracranial: Negative. Visualized orbits: Negative. Mastoids and visualized paranasal sinuses: Clear. Skeleton: No acute  abnormality. Upper chest: Calcified granuloma in the left upper lobe. Otherwise, visualized lung apices are clear. IMPRESSION: No lymphadenopathy in the neck. Electronically Signed   By: FrMargaretha Sheffield.D.   On: 09/04/2021 09:17   DG Foot Complete Left  Result Date: 08/12/2021 CLINICAL DATA:  Cabinet fell on toes. EXAM: LEFT FOOT - COMPLETE 3+ VIEW COMPARISON:  None Available. FINDINGS: Comminuted fracture is present within the distal phalanx of the third digit. Gas is present within the soft tissues. No foreign body is  identified. Transverse fracture is present in the middle phalanx of the second digit as well as the lateral aspect of the base of the distal phalanx. Diffuse soft tissue swelling is present in the second digit without foreign body. Proximal foot is unremarkable. IMPRESSION: 1. Comminuted fracture of the distal phalanx of the third digit. Gas is noted in the soft tissues. This is concerning for an open fracture. 2. Transverse fracture through the middle phalanx as well as the lateral aspect of the base of the distal phalanx in the second digit. 3. Diffuse soft tissue swelling. Electronically Signed   By: San Morelle M.D.   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 S/p Obi-benda x6 cylces- JUNE 2023- CT SCAN- No findings of adenopathy or active lymphoma. Stable stranding in the central mesentery compatible with treated lymphoma. CT NECK : No lymphadenopathy in the neck.    #Neutropenia /infection -OCT 2022-hospitalization pneumonia-CTA bilateral pneumonia-s/p antibiotics improved.  ANC 1.3 monitor for now.  Overall stable.  APRIL 2023- immunoglobulin levels- WNL.   # Shingles/postherpetic neuralgia.  Poorly controlled- patient on gabapentin 600 mg BID; add cymblata. 20 mg/day .  # chronic pain right quadrant abdmonal  pain/chornic- EGD/[facility] [NOV 2022- ] in nov 2022 [2016-colo]- STABLE.   # Difficulty sleeping- stress from upcoming possible amputation [s/p trauma left foot]- cymbalta as above.   # IV access: Mediport explanted; PIV  # DISPOSITION:  # Follow up in 3 months-- ;MD; labs- cbc/cmp;LDH-Dr.B      All questions were answered. The patient knows to call the clinic with any problems, questions or concerns.    [name], MD 09/05/2021 3:57 PM

## 2021-09-05 NOTE — Assessment & Plan Note (Addendum)
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 S/p Obi-benda x6 cylces- JUNE 2023- CT SCAN- No findings of adenopathy or active lymphoma. Stable stranding in the central mesentery compatible with treated lymphoma. CT NECK : No lymphadenopathy in the neck.  #Neutropenia /infection -OCT 2022-hospitalization pneumonia-CTA bilateral pneumonia-s/p antibiotics improved.  ANC 1.3 monitor for now.  Overall stable.  APRIL 2023- immunoglobulin levels- WNL.   # Shingles/postherpetic neuralgia.  Poorly controlled- patient on gabapentin 600 mg BID; add cymblata. 20 mg/day .  # chronic pain right quadrant abdmonal pain/chornic- EGD/[facility] [NOV 2022- ] in nov 2022 [2016-colo]- STABLE.   # Difficulty sleeping- stress from upcoming possible amputation [s/p trauma left foot]- cymbalta as above.   # IV access: Mediport explanted; PIV  # DISPOSITION:  # Follow up in 3 months-- ;MD; labs- cbc/cmp;LDH-Dr.B   # I reviewed the blood work- with the patient in detail; also reviewed the imaging independently [as summarized above]; and with the patient in detail.

## 2021-09-05 NOTE — Progress Notes (Signed)
Survivorship Care Plan visit completed.using our interpreter [place].  Treatment summary reviewed and given to patient in [place] and [place].  ASCO answers booklet reviewed and given to patient.  CARE program and Cancer Transitions discussed with patient along with other resources cancer center offers to patients and caregivers.  Patient verbalized understanding.

## 2021-09-05 NOTE — Progress Notes (Signed)
Still having pain from shingles, 7/10 pain today. Dropped 20 lb metal on left foot on 5/15 which caused broken toes with possibility of amputation.

## 2021-09-20 ENCOUNTER — Ambulatory Visit (INDEPENDENT_AMBULATORY_CARE_PROVIDER_SITE_OTHER): Payer: Commercial Managed Care - PPO

## 2021-09-20 DIAGNOSIS — R072 Precordial pain: Secondary | ICD-10-CM

## 2021-09-20 LAB — ECHOCARDIOGRAM COMPLETE
Area-P 1/2: 4.06 cm2
S' Lateral: 2.8 cm

## 2021-10-18 ENCOUNTER — Ambulatory Visit: Payer: Commercial Managed Care - PPO | Admitting: Cardiology

## 2021-10-18 ENCOUNTER — Encounter: Payer: Self-pay | Admitting: Cardiology

## 2021-10-18 VITALS — BP 120/70 | HR 75 | Ht 60.0 in | Wt 143.5 lb

## 2021-10-18 DIAGNOSIS — I10 Essential (primary) hypertension: Secondary | ICD-10-CM | POA: Diagnosis not present

## 2021-10-18 DIAGNOSIS — F419 Anxiety disorder, unspecified: Secondary | ICD-10-CM | POA: Diagnosis not present

## 2021-10-18 DIAGNOSIS — R072 Precordial pain: Secondary | ICD-10-CM | POA: Diagnosis not present

## 2021-10-18 DIAGNOSIS — R002 Palpitations: Secondary | ICD-10-CM

## 2021-10-18 NOTE — Patient Instructions (Signed)
Medication Instructions:  Your physician recommends that you continue on your current medications as directed. Please refer to the Current Medication list given to you today.  *If you need a refill on your cardiac medications before your next appointment, please call your pharmacy*    Follow-Up: At [facility], you and your health needs are our priority.  As part of our continuing mission to provide you with exceptional heart care, we have created designated Provider Care Teams.  These Care Teams include your primary Cardiologist (physician) and Advanced Practice Providers (APPs -  Physician Assistants and Nurse Practitioners) who all work together to provide you with the care you need, when you need it.  We recommend signing up for the patient portal called "MyChart".  Sign up information is provided on this After Visit Summary.  MyChart is used to connect with patients for Virtual Visits (Telemedicine).  Patients are able to view lab/test results, encounter notes, upcoming appointments, etc.  Non-urgent messages can be sent to your provider as well.   To learn more about what you can do with MyChart, go to [email or web address].    Your next appointment:   Follow up as needed   The format for your next appointment:   In Person  Provider:   Kate Sable, MD    Other Instructions   Important Information About Sugar

## 2021-10-18 NOTE — Progress Notes (Signed)
Cardiology Office Note:    Date:  10/18/2021   ID:  Kim Contreras, DOB September 06, 1960, MRN [id number]  PCP:  [facility] [place] [facility] Cardiologist:  None     Referring MD: Snyder   Chief Complaint  Patient presents with   Follow up Echo     Patient c/o chest pain, shortness of breath and occasional irregular heart beats. Medications reviewed by the patient verbally.     History of Present Illness:    Kim Contreras is a 61 y.o. female with a hx of hypertension, GERD who presents for follow-up.  Previously seen due to chest pain and palpitations.  Chest pain appears atypical.  Echocardiogram was obtained to evaluate any significant cardiac dysfunction.  Cardiac monitor normal also place to evaluate significant arrhythmias.  She states doing okay, thinks her symptoms are likely due to anxiety, has been watching YouTube videos to try to deal with her anxiety by taking deep breaths.   Past Medical History:  Diagnosis Date   Anemia 2006   Anxiety    Arthritis    Diverticulosis    Helicobacter pylori gastritis    History of hiatal hernia    Hypertension    Lymphoma ([place])     Past Surgical History:  Procedure Laterality Date   APPENDECTOMY     CHOLECYSTECTOMY N/A 01/19/2015   Procedure: LAPAROSCOPIC CHOLECYSTECTOMY;  [name], MD;  [name];  Service: General;  [name];   COLONOSCOPY WITH PROPOFOL N/A 10/27/2014   Procedure: COLONOSCOPY WITH PROPOFOL;  [name], MD;  Location: [facility] ENDOSCOPY;  Service: Endoscopy;  [name];   COLONOSCOPY WITH PROPOFOL N/A 02/05/2021   Procedure: COLONOSCOPY WITH PROPOFOL;  [name], MD;  [name];  Service: Endoscopy;  [name];   ESOPHAGOGASTRODUODENOSCOPY     ESOPHAGOGASTRODUODENOSCOPY (EGD) WITH PROPOFOL N/A 02/05/2021   Procedure: ESOPHAGOGASTRODUODENOSCOPY (EGD) WITH PROPOFOL;   [name], MD;  [name];  Service: Endoscopy;  [name];   EXCISION MASS ABDOMINAL N/A 06/03/2019   Procedure: EXCISION MASS ABDOMINAL;  [name], MD;  [name];  Service: General;  [name];  abdominal node biopsy   NASAL SINUS SURGERY     PORT-A-CATH REMOVAL  09/2020   by Dr. Towanda Malkin PLACEMENT Left 07/08/2019   Procedure: INSERTION PORT-A-CATH;  [name], MD;  [name];  Service: General;  Laterality: Left;   TUBAL LIGATION      Current Medications: Current Meds  Medication Sig   albuterol (VENTOLIN HFA) 108 (90 Base) MCG/ACT inhaler Inhale into the lungs.   esomeprazole (NEXIUM) 40 MG capsule Take 40 mg by mouth daily.   gabapentin (NEURONTIN) 300 MG capsule Take 600 mg by mouth 2 (two) times daily.   levocetirizine (XYZAL) 5 MG tablet Take 5 mg by mouth every evening.   losartan (COZAAR) 50 MG tablet Take 50 mg by mouth daily.   montelukast (SINGULAIR) 10 MG tablet Take 10 mg by mouth every evening.     Allergies:   Chlorhexidine and Pseudoephedrine hcl   Social History   Socioeconomic History   Marital status: Married    Spouse name: Not on file   Number of children: Not on file   Years of education: Not on file   Highest education level: Not on file  Occupational History   Not on file  Tobacco Use  Smoking status: Never   Smokeless tobacco: Never  Vaping Use   Vaping Use: Never used  Substance and Sexual Activity   Alcohol use: No   Drug use: No   Sexual activity: Not on file  Other Topics Concern   Not on file  Social History Narrative   Not on file   Social Determinants of Health   Financial Resource Strain: Not on file  Food Insecurity: Not on file  Transportation Needs: Not on file  Physical Activity: Not on file  Stress: Not on file  Social Connections: Not on file     Family History: The patient's family history includes Breast  cancer (age of onset: 27) in an other family member. There is no history of Prostate cancer, Bladder Cancer, or Kidney cancer.  ROS:   Please see the history of present illness.     All other systems reviewed and are negative.  EKGs/Labs/Other Studies Reviewed:    The following studies were reviewed today:   EKG:  EKG not ordered today.    Recent Labs: 11/08/2020: TSH 1.290 09/05/2021: ALT 21; BUN 19; Creatinine, Ser 0.72; Hemoglobin 12.8; Platelets 238; Potassium 3.8; Sodium 138  Recent Lipid Panel No results found for: "CHOL", "TRIG", "HDL", "CHOLHDL", "VLDL", "LDLCALC", "LDLDIRECT"   Risk Assessment/Calculations:         Physical Exam:    VS:  BP 120/70 (BP Location: Left Arm, Patient Position: Sitting, Cuff Size: Normal)   Pulse 75   Ht 5' (1.524 m)   Wt 143 lb 8 oz (65.1 kg)   LMP 01/18/2005   SpO2 98%   BMI 28.03 kg/m     Wt Readings from Last 3 Encounters:  10/18/21 143 lb 8 oz (65.1 kg)  09/05/21 138 lb 8 oz (62.8 kg)  08/30/21 139 lb 6.4 oz (63.2 kg)     GEN:  Well nourished, well developed in no acute distress HEENT: Normal NECK: No JVD; No carotid bruits CARDIAC: RRR, no murmurs, rubs, gallops RESPIRATORY:  Clear to auscultation without rales, wheezing or rhonchi  ABDOMEN: Soft, non-tender, non-distended MUSCULOSKELETAL:  No edema; left foot in dressing SKIN: Warm and dry NEUROLOGIC:  Alert and oriented x 3 PSYCHIATRIC:  Normal affect   ASSESSMENT:    1. Precordial pain   2. Palpitations   3. Primary hypertension   4. Anxiety     PLAN:    In order of problems listed above:  Chest pain, atypical.  Echo showed normal systolic function, EF 60 to 65%, no wall motion abnormalities. Palpitations, cardiac monitor did not show any significant arrhythmias.  Symptoms likely from anxiety, adequate management advised. Hypertension, BP controlled.  Continue losartan 50 mg daily. Anxiety, recommend follow-up with PCP regarding adequate  management.  Follow-up as needed      Spanish interpreter used for this visit.  Medication Adjustments/Labs and Tests Ordered: Current medicines are reviewed at length with the patient today.  Concerns regarding medicines are outlined above.  No orders of the defined types were placed in this encounter.  No orders of the defined types were placed in this encounter.   Patient Instructions  Medication Instructions:  Your physician recommends that you continue on your current medications as directed. Please refer to the Current Medication list given to you today.  *If you need a refill on your cardiac medications before your next appointment, please call your pharmacy*    Follow-Up: At [facility], you and your health needs are our priority.  As part of our  continuing mission to provide you with exceptional heart care, we have created designated Provider Care Teams.  These Care Teams include your primary Cardiologist (physician) and Advanced Practice Providers (APPs -  Physician Assistants and Nurse Practitioners) who all work together to provide you with the care you need, when you need it.  We recommend signing up for the patient portal called "MyChart".  Sign up information is provided on this After Visit Summary.  MyChart is used to connect with patients for Virtual Visits (Telemedicine).  Patients are able to view lab/test results, encounter notes, upcoming appointments, etc.  Non-urgent messages can be sent to your provider as well.   To learn more about what you can do with MyChart, go to [email or web address].    Your next appointment:   Follow up as needed   The format for your next appointment:   In Person  Provider:   Kate Sable, MD    Other Instructions   Important Information About Sugar         Signed, Kate Sable, MD  10/18/2021 3:10 PM    [place]

## 2021-10-21 ENCOUNTER — Other Ambulatory Visit: Payer: Self-pay

## 2021-10-26 ENCOUNTER — Other Ambulatory Visit: Payer: Self-pay

## 2021-11-27 ENCOUNTER — Encounter: Payer: Self-pay | Admitting: Internal Medicine

## 2021-11-28 HISTORY — PX: TOE AMPUTATION: SHX809

## 2021-12-04 ENCOUNTER — Encounter: Payer: Self-pay | Admitting: Internal Medicine

## 2021-12-04 ENCOUNTER — Inpatient Hospital Stay: Payer: BLUE CROSS/BLUE SHIELD | Attending: Internal Medicine | Admitting: Internal Medicine

## 2021-12-04 ENCOUNTER — Inpatient Hospital Stay: Payer: BLUE CROSS/BLUE SHIELD

## 2021-12-04 DIAGNOSIS — Z803 Family history of malignant neoplasm of breast: Secondary | ICD-10-CM | POA: Diagnosis not present

## 2021-12-04 DIAGNOSIS — Z9221 Personal history of antineoplastic chemotherapy: Secondary | ICD-10-CM | POA: Insufficient documentation

## 2021-12-04 DIAGNOSIS — R519 Headache, unspecified: Secondary | ICD-10-CM | POA: Diagnosis not present

## 2021-12-04 DIAGNOSIS — I10 Essential (primary) hypertension: Secondary | ICD-10-CM | POA: Insufficient documentation

## 2021-12-04 DIAGNOSIS — C8238 Follicular lymphoma grade IIIa, lymph nodes of multiple sites: Secondary | ICD-10-CM

## 2021-12-04 DIAGNOSIS — Z8572 Personal history of non-Hodgkin lymphomas: Secondary | ICD-10-CM | POA: Insufficient documentation

## 2021-12-04 DIAGNOSIS — B0229 Other postherpetic nervous system involvement: Secondary | ICD-10-CM | POA: Insufficient documentation

## 2021-12-04 DIAGNOSIS — K219 Gastro-esophageal reflux disease without esophagitis: Secondary | ICD-10-CM | POA: Insufficient documentation

## 2021-12-04 DIAGNOSIS — G8929 Other chronic pain: Secondary | ICD-10-CM | POA: Insufficient documentation

## 2021-12-04 LAB — COMPREHENSIVE METABOLIC PANEL
ALT: 19 U/L (ref 0–44)
AST: 18 U/L (ref 15–41)
Albumin: 4.3 g/dL (ref 3.5–5.0)
Alkaline Phosphatase: 84 U/L (ref 38–126)
Anion gap: 5 (ref 5–15)
BUN: 14 mg/dL (ref 6–20)
CO2: 27 mmol/L (ref 22–32)
Calcium: 9.2 mg/dL (ref 8.9–10.3)
Chloride: 103 mmol/L (ref 98–111)
Creatinine, Ser: 0.73 mg/dL (ref 0.44–1.00)
GFR, Estimated: >60 mL/min (ref >60)
Glucose, Bld: 115 mg/dL — ABNORMAL HIGH (ref 70–99)
Potassium: 3.8 mmol/L (ref 3.5–5.1)
Sodium: 135 mmol/L (ref 135–145)
Total Bilirubin: 0.4 mg/dL (ref 0.3–1.2)
Total Protein: 7.6 g/dL (ref 6.5–8.1)

## 2021-12-04 LAB — CBC WITH DIFFERENTIAL/PLATELET
Abs Immature Granulocytes: 0.02 10*3/uL (ref 0.00–0.07)
Basophils Absolute: 0 10*3/uL (ref 0.0–0.1)
Basophils Relative: 1 %
Eosinophils Absolute: 0.1 10*3/uL (ref 0.0–0.5)
Eosinophils Relative: 2 %
HCT: 40.1 % (ref 36.0–46.0)
Hemoglobin: 13.8 g/dL (ref 12.0–15.0)
Immature Granulocytes: 0 %
Lymphocytes Relative: 34 %
Lymphs Abs: 1.6 10*3/uL (ref 0.7–4.0)
MCH: 32.9 pg (ref 26.0–34.0)
MCHC: 34.4 g/dL (ref 30.0–36.0)
MCV: 95.7 fL (ref 80.0–100.0)
Monocytes Absolute: 0.4 10*3/uL (ref 0.1–1.0)
Monocytes Relative: 9 %
Neutro Abs: 2.6 10*3/uL (ref 1.7–7.7)
Neutrophils Relative %: 54 %
Platelets: 202 10*3/uL (ref 150–400)
RBC: 4.19 MIL/uL (ref 3.87–5.11)
RDW: 12.9 % (ref 11.5–15.5)
WBC: 4.8 10*3/uL (ref 4.0–10.5)
nRBC: 0 % (ref 0.0–0.2)

## 2021-12-04 LAB — LACTATE DEHYDROGENASE: LDH: 121 U/L (ref 98–192)

## 2021-12-04 NOTE — Progress Notes (Signed)
C/o sore taste in her mouth when she wakes up.

## 2021-12-04 NOTE — Assessment & Plan Note (Addendum)
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 S/p Obi-benda x6 cylces- JUNE 2023- CT SCAN- No findings of adenopathy or active lymphoma. Stable stranding in the central mesentery compatible with treated lymphoma. CT NECK : No lymphadenopathy in the neck.  #Neutropenia /infection -OCT 2022-hospitalization pneumonia-CTA bilateral pneumonia-s/p antibiotics improved.  APRIL 2023- immunoglobulin levels- WNL.  Today ANC 2.6. STABLE.   # Reflux-recommend re-starting Nexium BID x1 week; and then 1 one a day. And tums  Prn.   # Left foot trauma [s/p middle partial toe amputtaion]- stable.   # Shingles/postherpetic neuralgia.  Off gabapentin/cymbalta-  STABLE.  # chronic pain right quadrant abdmonal pain/chornic- EGD/Colo [NOV 2022- ] in nov 2022 [2016-colo]- STABLE.   # IV access: Mediport explanted; PIV  # DISPOSITION:  # Follow up in 6 months-- ;MD; labs- cbc/cmp;LDH; CT CAP-Dr.B

## 2021-12-04 NOTE — Progress Notes (Signed)
Kim Contreras NOTE  Patient Care Team: [facility], [facility] as PCP - General ([place]) [name], [name], MD as Consulting Physician (General Surgery)  CHIEF COMPLAINTS/PURPOSE OF CONSULTATION: Lymphoma   Oncology History Overview Note  # 5th FEB 2021- ABDOMINAL MASS-  9.3 x 3.0 cm central mesenteric soft tissue mass is identified on image 38/series 2. 3.0x 3.0 cm collar of soft tissue surrounds branches of the superior mesenteric artery and vein on image 47/2 with relatively little mass-effect on the vascular anatomy. 2.0 x 1.5 cm nodular component of this abnormal soft tissue is identified in the mesentery of the right pelvis on 55/2. FEB 2021- PET-  PET scan-February 2021-SUV around 5.5 again highly suggestive of malignancy lymphoma.  Small cluster left supra pelvic lymph node/small retroperitoneal lymph nodes-5 mm-Douville score 3.   # MARCH 2706- grade 3A follicular lymphoma [Open biopsy Dr. Burnett-] STAGE II vs III (equivocal neck lymph node) [no bone marrow bx - #Status post-Gazyva-Bendamustine x6 cycles [finished August 2021]- PET scan February, 2022-treated disease noted to have any progression.  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  Kim Contreras 61 y.o.  female with follicular lymphoma grade 3 status post Gazyva-Benda-with intermittent neutropenia currently on surveillance is here for follow-up.  Last chemotherapy in February 2022.  Patient underwent  left medial toe partial amputation status post trauma. No further episodes of pneumonia. She continues to complain of chronic fatigue.    Complains of reflux like symptoms. No nausea or vomiting.    Review of Systems  Constitutional:  Positive for malaise/fatigue. Negative for chills, diaphoresis, [name], hemoptysis, sputum production, [name], palpitations, [name], diarrhea, heartburn, melena and vomiting.  Musculoskeletal:  Positive for back pain and joint pain.  Skin: Negative.  Negative for itching and rash.  Neurological:  Positive for tingling. Negative for dizziness, focal weakness and weakness.  Endo/Heme/Allergies:  Does not bruise/bleed easily.  Psychiatric/Behavioral:  Negative for depression. The patient has insomnia. The patient is not nervous/anxious.      MEDICAL HISTORY:  Past Medical History:  Diagnosis Date   Anemia 2006   Anxiety    Arthritis    Diverticulosis    Helicobacter pylori gastritis    History of hiatal hernia    Hypertension    Lymphoma ([place])     SURGICAL HISTORY: Past Surgical History:  Procedure Laterality Date   APPENDECTOMY     CHOLECYSTECTOMY N/A 01/19/2015   Procedure: LAPAROSCOPIC CHOLECYSTECTOMY;  [name], MD;  [name];  Service: General;  [name];   COLONOSCOPY WITH PROPOFOL N/A 10/27/2014   Procedure: COLONOSCOPY WITH PROPOFOL;  [name], MD;  Location: [facility] ENDOSCOPY;  Service: Endoscopy;  [name];   COLONOSCOPY WITH PROPOFOL  N/A 02/05/2021   Procedure: COLONOSCOPY WITH PROPOFOL;  [name], MD;  Location: [facility] ENDOSCOPY;  Service: Endoscopy;  Laterality:  N/A;   ESOPHAGOGASTRODUODENOSCOPY     ESOPHAGOGASTRODUODENOSCOPY (EGD) WITH PROPOFOL N/A 02/05/2021   Procedure: ESOPHAGOGASTRODUODENOSCOPY (EGD) WITH PROPOFOL;  [name], MD;  [name];  Service: Endoscopy;  [name];   EXCISION MASS ABDOMINAL N/A 06/03/2019   Procedure: EXCISION MASS ABDOMINAL;  [name], MD;  [name];  Service: General;  [name];  abdominal node biopsy   NASAL SINUS SURGERY     PORT-A-CATH REMOVAL  09/2020   by Dr. Towanda Malkin PLACEMENT Left 07/08/2019   Procedure: INSERTION PORT-A-CATH;  [name], MD;  [name];  Service: General;  Laterality: Left;   TOE AMPUTATION Left 11/28/2021   middle toe   TUBAL LIGATION      SOCIAL HISTORY: Social History   Socioeconomic History   Marital status: Married    Spouse name: Not on file   Number of children: Not on file   Years of education: Not on file   Highest education level: Not on file  Occupational History   Not on file  Tobacco Use   Smoking status: Never   Smokeless tobacco: Never  Vaping Use   Vaping Use: Never used  Substance and Sexual Activity   Alcohol use: No   Drug use: No   Sexual activity: Not on file  Other Topics Concern   Not on file  Social History Narrative   Not on file   Social Determinants of Health   Financial Resource Strain: Not on file  Food Insecurity: Not on file  Transportation Needs: Not on file  Physical Activity: Not on file  Stress: Not on file  Social Connections: Not on file  Intimate Partner Violence: Not on file    FAMILY HISTORY: Family History  Problem Relation Age of Onset   Breast cancer Other 59   Prostate cancer Neg Hx    Bladder Cancer Neg Hx    Kidney cancer Neg Hx     ALLERGIES:  is allergic to chlorhexidine and pseudoephedrine hcl.  MEDICATIONS:  Current Outpatient Medications  Medication Sig Dispense Refill   albuterol  (VENTOLIN HFA) 108 (90 Base) MCG/ACT inhaler Inhale into the lungs.     clonazePAM (KLONOPIN) 0.5 MG tablet Take 0.5 mg by mouth daily as needed.     esomeprazole (NEXIUM) 40 MG capsule Take 40 mg by mouth daily.     gabapentin (NEURONTIN) 300 MG capsule Take 600 mg by mouth 2 (two) times daily.     hydrOXYzine (ATARAX) 10 MG tablet Take 10 mg by mouth 3 (three) times daily.     levocetirizine (XYZAL) 5 MG tablet Take 5 mg by mouth every evening.     losartan (COZAAR) 50 MG tablet Take 50 mg by mouth daily.     montelukast (SINGULAIR) 10 MG tablet Take 10 mg by mouth every evening.     sertraline (ZOLOFT) 50 MG tablet Take by mouth.     pantoprazole (PROTONIX) 40 MG tablet Take 1 tablet (40 mg total) by mouth daily. (Patient not taking: Reported on 10/18/2021) 30 tablet 3   No current facility-administered medications for this visit.   Facility-Administered Medications Ordered in Other Visits  Medication Dose Route Frequency Provider Last Rate Last Admin   sodium chloride flush (NS) 0.9 % injection 10 mL  10 mL Intravenous PRN Charlaine Dalton  R, MD   10 mL at 12/28/19 0815       PHYSICAL EXAMINATION: ECOG PERFORMANCE STATUS: 0 - Asymptomatic  Vitals:   12/04/21 0953  BP: 139/76  Pulse: 65  Temp: (!) 96.3 F (35.7 C)  SpO2: 100%   Filed Weights   12/04/21 0953  Weight: 138 lb 6.4 oz (62.8 kg)   Patient in wheelchair because of left foot trauma.  Physical Exam HENT:     Head: Normocephalic and atraumatic.     Mouth/Throat:     Pharynx: No oropharyngeal exudate.  Eyes:     Pupils: Pupils are equal, round, and reactive to light.  Cardiovascular:     Rate and Rhythm: Normal rate and regular rhythm.  Pulmonary:     Effort: Pulmonary effort is normal. No respiratory distress.     Breath sounds: Normal breath sounds. No wheezing.  Abdominal:     General: Bowel sounds are normal. There is no distension.     Palpations: Abdomen is soft. There is no mass.     Tenderness:  There is no abdominal tenderness. There is no guarding or rebound.  Musculoskeletal:        General: No tenderness. Normal range of motion.     Cervical back: Normal range of motion and neck supple.     Comments: Mild swelling of the left lower extremity; mild tenderness noted.  Skin:    General: Skin is warm.  Neurological:     Mental Status: She is alert and oriented to person, place, and time.  Psychiatric:        Mood and Affect: Affect normal.      LABORATORY DATA:  I have reviewed the data as listed Lab Results  Component Value Date   WBC 4.8 12/04/2021   HGB 13.8 12/04/2021   HCT 40.1 12/04/2021   MCV 95.7 12/04/2021   PLT 202 12/04/2021   Recent Labs    06/06/21 0937 09/03/21 0844 09/05/21 0927 12/04/21 0941  NA 136  --  138 135  K 4.0  --  3.8 3.8  CL 103  --  103 103  CO2 26  --  28 27  GLUCOSE 96  --  101* 115*  BUN 18  --  19 14  CREATININE 0.68 0.80 0.72 0.73  CALCIUM 9.1  --  9.1 9.2  GFRNONAA >60  --  >60 >60  PROT 7.2  --  7.4 7.6  ALBUMIN 4.2  --  4.0 4.3  AST 13*  --  17 18  ALT 13  --  21 19  ALKPHOS 72  --  79 84  BILITOT 0.8  --  0.7 0.4    RADIOGRAPHIC STUDIES: I have personally reviewed the radiological images as listed and agreed with the findings in the report. No results found.   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 S/p Obi-benda x6 cylces- JUNE 2023- CT SCAN- No findings of adenopathy or active lymphoma. Stable stranding in the central mesentery compatible with treated lymphoma. CT NECK : No lymphadenopathy in the neck.    #Neutropenia /infection -OCT 2022-hospitalization pneumonia-CTA bilateral pneumonia-s/p antibiotics improved.  APRIL 2023- immunoglobulin levels- WNL.  Today ANC 2.6. STABLE.   # Reflux-recommend re-starting Nexium BID x1 week; and then 1 one a day. And tums  Prn.   # Left  foot trauma [s/p middle partial toe amputtaion]-  stable.   # Shingles/postherpetic neuralgia.  Off gabapentin/cymbalta-  STABLE.  # chronic pain right quadrant abdmonal pain/chornic- EGD/Colo [NOV 2022- ] in nov 2022 [2016-colo]- STABLE.   # IV access: Mediport explanted; PIV  # DISPOSITION:  # Follow up in 6 months-- ;MD; labs- cbc/cmp;LDH; CT CAP-Dr.B         All questions were answered. The patient knows to call the clinic with any problems, questions or concerns.    [name], MD 12/04/2021 11:00 AM

## 2022-04-23 ENCOUNTER — Emergency Department: Payer: BLUE CROSS/BLUE SHIELD

## 2022-04-23 ENCOUNTER — Emergency Department
Admission: EM | Admit: 2022-04-23 | Discharge: 2022-04-23 | Disposition: A | Discharge status: Home / Self Care | Payer: BLUE CROSS/BLUE SHIELD | Attending: Emergency Medicine | Admitting: Emergency Medicine

## 2022-04-23 ENCOUNTER — Other Ambulatory Visit: Payer: Self-pay

## 2022-04-23 DIAGNOSIS — I10 Essential (primary) hypertension: Secondary | ICD-10-CM | POA: Insufficient documentation

## 2022-04-23 DIAGNOSIS — H6993 Unspecified Eustachian tube disorder, bilateral: Secondary | ICD-10-CM | POA: Insufficient documentation

## 2022-04-23 DIAGNOSIS — M5412 Radiculopathy, cervical region: Secondary | ICD-10-CM | POA: Insufficient documentation

## 2022-04-23 DIAGNOSIS — M542 Cervicalgia: Secondary | ICD-10-CM | POA: Diagnosis present

## 2022-04-23 DIAGNOSIS — R519 Headache, unspecified: Secondary | ICD-10-CM | POA: Insufficient documentation

## 2022-04-23 DIAGNOSIS — G44209 Tension-type headache, unspecified, not intractable: Secondary | ICD-10-CM

## 2022-04-23 LAB — CBC
HCT: 40.8 % (ref 36.0–46.0)
Hemoglobin: 13.7 g/dL (ref 12.0–15.0)
MCH: 32.6 pg (ref 26.0–34.0)
MCHC: 33.6 g/dL (ref 30.0–36.0)
MCV: 97.1 fL (ref 80.0–100.0)
Platelets: 234 10*3/uL (ref 150–400)
RBC: 4.2 MIL/uL (ref 3.87–5.11)
RDW: 12.9 % (ref 11.5–15.5)
WBC: 4.4 10*3/uL (ref 4.0–10.5)
nRBC: 0 % (ref 0.0–0.2)

## 2022-04-23 LAB — BASIC METABOLIC PANEL
Anion gap: 11 (ref 5–15)
BUN: 15 mg/dL (ref 6–20)
CO2: 24 mmol/L (ref 22–32)
Calcium: 9.9 mg/dL (ref 8.9–10.3)
Chloride: 102 mmol/L (ref 98–111)
Creatinine, Ser: 1.04 mg/dL — ABNORMAL HIGH (ref 0.44–1.00)
GFR, Estimated: >60 mL/min (ref >60)
Glucose, Bld: 91 mg/dL (ref 70–99)
Potassium: 3.9 mmol/L (ref 3.5–5.1)
Sodium: 137 mmol/L (ref 135–145)

## 2022-04-23 MED ORDER — METHOCARBAMOL 500 MG PO TABS
1000.0000 mg | ORAL_TABLET | Freq: Once | ORAL | Status: AC
  Administered 2022-04-23: 1000 mg via ORAL
  Filled 2022-04-23: qty 2

## 2022-04-23 MED ORDER — CETIRIZINE HCL 10 MG PO TABS: 10.0000 mg | ORAL_TABLET | Freq: Every day | ORAL | 0 refills | Status: AC

## 2022-04-23 MED ORDER — FLUTICASONE PROPIONATE 50 MCG/ACT NA SUSP: 1.0000 | Freq: Two times a day (BID) | NASAL | 0 refills | Status: AC

## 2022-04-23 MED ORDER — MELOXICAM 7.5 MG PO TABS
15.0000 mg | ORAL_TABLET | Freq: Once | ORAL | Status: AC
  Administered 2022-04-23: 15 mg via ORAL
  Filled 2022-04-23: qty 2

## 2022-04-23 MED ORDER — MELOXICAM 15 MG PO TABS: 15.0000 mg | ORAL_TABLET | Freq: Every day | ORAL | 0 refills | Status: DC

## 2022-04-23 MED ORDER — METHOCARBAMOL 500 MG PO TABS: 500.0000 mg | ORAL_TABLET | Freq: Every evening | ORAL | 1 refills | Status: DC | PRN

## 2022-04-23 NOTE — ED Triage Notes (Addendum)
Pt comes from home via pov c/o back pain and headache since November. Pt reports pain starts in her back and goes up to her head, feels like a burning, and would occur everyday but at different times. Pt also reports feeling pressure and ringing in her ears, pt has hx of tinnitus. Pt has hx of htn, took medicine today. NAD at this time. Denies CP, SOB

## 2022-04-23 NOTE — ED Provider Notes (Signed)
[facility] Provider Note  Patient Contact: 9:48 PM (approximate)   History   Headache   HPI  Kim Contreras is a 62 y.o. female who presents emergency department for intermittent neck pain, back pain, low back pain.  Patient will have intermittent sensations into her abdomen or her chest or today into her head.  No unilateral weakness, slurred speech, vision changes.  Patient states that the pain radiates from her neck into the back of her head, around her head and behind her eyes.  Patient has not tried any medication for same.  She has had off-and-on symptoms for approximately 4 months.  No trauma precipitating this.  Patient denies any chest pain, shortness of breath, abdominal pain currently.  No cough, shortness of breath, emesis, diarrhea or constipation.     Physical Exam   Triage Vital Signs: ED Triage Vitals  Enc Vitals Group     BP 04/23/22 1956 (!) 170/98     Pulse Rate 04/23/22 1956 61     Resp 04/23/22 1956 18     Temp 04/23/22 1956 98.3 F (36.8 C)     Temp Source 04/23/22 1956 Oral     SpO2 04/23/22 1956 97 %     Weight --      Height --      Head Circumference --      Peak Flow --      Pain Score 04/23/22 1957 9     Pain Loc --      Pain Edu? --      Excl. in [place]? --     Most recent vital signs: Vitals:   04/23/22 1956  BP: (!) 170/98  Pulse: 61  Resp: 18  Temp: 98.3 F (36.8 C)  SpO2: 97%     General: Alert and in no acute distress. Eyes:  PERRL. EOMI. Head: No acute traumatic findings ENT:      Ears: Slight bulging bilateral TMs.  No injection.      Nose: No congestion/rhinnorhea.      Mouth/Throat: Mucous membranes are moist. Neck: No stridor.  No midline tenderness.  Patient is bilateral paraspinal muscle tenderness that is mild in nature.  This extends into the thoracic and lumbar region. Cardiovascular:  Good peripheral perfusion Respiratory: Normal respiratory effort without tachypnea or retractions. Lungs  CTAB. Good air entry to the bases with no decreased or absent breath sounds Musculoskeletal: Full range of motion to all extremities.  Neurologic:  No gross focal neurologic deficits are appreciated.  Cranial nerves II through XII grossly intact. Skin:   No rash noted Other:   ED Results / Procedures / Treatments   Labs (all labs ordered are listed, but only abnormal results are displayed) Labs Reviewed  BASIC METABOLIC PANEL - Abnormal; Notable for the following components:      Result Value   Creatinine, Ser 1.04 (*)    All other components within normal limits  CBC     EKG     RADIOLOGY  I personally viewed, evaluated, and interpreted these images as part of my medical decision making, as well as reviewing the written report by the radiologist.  ED Provider Interpretation: No acute findings on CT scan of the head.  CT Head Wo Contrast  Result Date: 04/23/2022 CLINICAL DATA:  Headache. EXAM: CT HEAD WITHOUT CONTRAST TECHNIQUE: Contiguous axial images were obtained from the base of the skull through the vertex without intravenous contrast. RADIATION DOSE REDUCTION: This exam was performed according  to the departmental dose-optimization program which includes automated exposure control, adjustment of the mA and/or kV according to patient size and/or use of iterative reconstruction technique. COMPARISON:  None Available. FINDINGS: Brain: There is mild cerebral atrophy with widening of the extra-axial spaces and ventricular dilatation. There are areas of decreased attenuation within the white matter tracts of the supratentorial brain, consistent with microvascular disease changes. Vascular: No hyperdense vessel or unexpected calcification. Skull: Normal. Negative for fracture or focal lesion. Sinuses/Orbits: No acute finding. Other: None. IMPRESSION: 1. No acute intracranial abnormality. 2. Generalized cerebral atrophy and microvascular disease changes of the supratentorial brain.  Electronically Signed   By: Virgina Norfolk M.D.   On: 04/23/2022 20:32    PROCEDURES:  Critical Care performed: No  Procedures   MEDICATIONS ORDERED IN ED: Medications  meloxicam (MOBIC) tablet 15 mg (has no administration in time range)  methocarbamol (ROBAXIN) tablet 1,000 mg (has no administration in time range)     IMPRESSION / MDM / ASSESSMENT AND PLAN / ED COURSE  I reviewed the triage vital signs and the nursing notes.                                 Differential diagnosis includes, but is not limited to, cervical radiculopathy, CVA, intracranial hemorrhage, anxiety  Patient's presentation is most consistent with acute presentation with potential threat to life or bodily function.   Patient's diagnosis is consistent with cervical radiculopathy, headache.  Patient presents emergency department with intermittent symptoms in her back.  She will have pain radiating from her neck, mid and lower back occasionally.  Patient sometimes feels this in her abdomen, sometimes in her chest, sometimes up into her head.  Patient is currently neurologically intact.  Imaging is reassuring at this time.  Patient's labs are reassuring.  Tender diffusely in the paraspinal muscle groups.  At this time there is no nuchal rigidity or concern for meningitis.  Given symptoms have been persisting x 4 months.  Will place the patient on anti-inflammatory muscle relaxer.  While in room patient also endorsed some popping in her ears it appears that she has some mild eustachian tube dysfunction.  Will treat her allergic rhinitis with Flonase and Zyrtec.  At this time concerning signs and symptoms and return precautions are discussed with the patient.  Follow-up primary care as needed..  Patient is given ED precautions to return to the ED for any worsening or new symptoms.     FINAL CLINICAL IMPRESSION(S) / ED DIAGNOSES   Final diagnoses:  Cervical radiculopathy  Acute non intractable tension-type  headache  Dysfunction of both eustachian tubes     Rx / DC Orders   ED Discharge Orders          Ordered    meloxicam (MOBIC) 15 MG tablet  Daily        04/23/22 2222    methocarbamol (ROBAXIN) 500 MG tablet  At bedtime PRN        04/23/22 2222    fluticasone (FLONASE) 50 MCG/ACT nasal spray  2 times daily        04/23/22 2222    cetirizine (ZYRTEC) 10 MG tablet  Daily        04/23/22 2222             Note:  This document was prepared using Dragon voice recognition software and may include unintentional dictation errors.   Darletta Moll, PA-C 04/23/22  2223    Lavonia Drafts, MD 04/23/22 2259

## 2022-05-12 ENCOUNTER — Emergency Department: Payer: BLUE CROSS/BLUE SHIELD

## 2022-05-12 ENCOUNTER — Emergency Department
Admission: EM | Admit: 2022-05-12 | Discharge: 2022-05-12 | Disposition: A | Discharge status: Home / Self Care | Payer: BLUE CROSS/BLUE SHIELD | Attending: Emergency Medicine | Admitting: Emergency Medicine

## 2022-05-12 ENCOUNTER — Other Ambulatory Visit: Payer: Self-pay

## 2022-05-12 DIAGNOSIS — R053 Chronic cough: Secondary | ICD-10-CM | POA: Insufficient documentation

## 2022-05-12 DIAGNOSIS — I10 Essential (primary) hypertension: Secondary | ICD-10-CM | POA: Diagnosis not present

## 2022-05-12 DIAGNOSIS — R059 Cough, unspecified: Secondary | ICD-10-CM | POA: Diagnosis present

## 2022-05-12 LAB — CBC
HCT: 39 % (ref 36.0–46.0)
Hemoglobin: 13.1 g/dL (ref 12.0–15.0)
MCH: 32.7 pg (ref 26.0–34.0)
MCHC: 33.6 g/dL (ref 30.0–36.0)
MCV: 97.3 fL (ref 80.0–100.0)
Platelets: 250 10*3/uL (ref 150–400)
RBC: 4.01 MIL/uL (ref 3.87–5.11)
RDW: 13.2 % (ref 11.5–15.5)
WBC: 6.4 10*3/uL (ref 4.0–10.5)
nRBC: 0 % (ref 0.0–0.2)

## 2022-05-12 LAB — COMPREHENSIVE METABOLIC PANEL
ALT: 12 U/L (ref 0–44)
AST: 18 U/L (ref 15–41)
Albumin: 4.1 g/dL (ref 3.5–5.0)
Alkaline Phosphatase: 72 U/L (ref 38–126)
Anion gap: 9 (ref 5–15)
BUN: 19 mg/dL (ref 6–20)
CO2: 24 mmol/L (ref 22–32)
Calcium: 9.1 mg/dL (ref 8.9–10.3)
Chloride: 107 mmol/L (ref 98–111)
Creatinine, Ser: 0.94 mg/dL (ref 0.44–1.00)
GFR, Estimated: >60 mL/min (ref >60)
Glucose, Bld: 102 mg/dL — ABNORMAL HIGH (ref 70–99)
Potassium: 4.1 mmol/L (ref 3.5–5.1)
Sodium: 140 mmol/L (ref 135–145)
Total Bilirubin: 0.6 mg/dL (ref 0.3–1.2)
Total Protein: 7.5 g/dL (ref 6.5–8.1)

## 2022-05-12 LAB — LIPASE, BLOOD: Lipase: 49 U/L (ref 11–51)

## 2022-05-12 LAB — TROPONIN I (HIGH SENSITIVITY): Troponin I (High Sensitivity): <2 ng/L (ref <18)

## 2022-05-12 MED ORDER — HYDROCOD POLI-CHLORPHE POLI ER 10-8 MG/5ML PO SUER
5.0000 mL | Freq: Once | ORAL | Status: AC
  Administered 2022-05-12: 5 mL via ORAL
  Filled 2022-05-12: qty 5

## 2022-05-12 MED ORDER — HYDROCOD POLI-CHLORPHE POLI ER 10-8 MG/5ML PO SUER: 5.0000 mL | Freq: Two times a day (BID) | ORAL | 0 refills | Status: DC | PRN

## 2022-05-12 NOTE — Discharge Instructions (Addendum)
As we discussed, continue taking your regular medications.  Try taking the cough syrup that we prescribed, particularly to try a and help you get some sleep.  Remember that it can make you sleepy so you should not take it before you drive or go to work.  Your blood pressure was elevated tonight, but we do not need to change her medications at this time.  You should follow-up with your primary care provider at the next available opportunity and have your blood pressure rechecked and see if you need a change to your medications.  Continue taking your regular blood pressure medicine in the meantime.    Return to the emergency department if you develop new or worsening symptoms that concern you.  --------  Como comentamos, contine tomando sus medicamentos habituales. Intente tomar el jarabe para la tos que le recetamos, especialmente para intentar dormir un poco. Recuerde que puede provocar sueo por lo que no debe tomarlo antes de Forensic psychologist o ir a Fish farm manager.  Su presin arterial estuvo elevada esta noche, pero no necesitamos cambiar sus medicamentos en este momento. Debe hacer un seguimiento con su proveedor de atencin primaria en la prxima oportunidad disponible y volver a Chief Technology Officer su presin arterial y ver si necesita un cambio en sus medicamentos. Mientras tanto, contine tomando su medicamento habitual para la presin arterial.  Regrese al departamento de emergencias si desarrolla sntomas nuevos o que Geophysical data processor.

## 2022-05-12 NOTE — ED Provider Notes (Signed)
[facility] Provider Note    Event Date/Time   First MD Initiated Contact with Patient 05/12/22 0254     (approximate)   History   Chest Pain   HPI The patient and/or family speak(s) Spanish.  They understand they have the right to the use of a hospital interpreter, however at this time they prefer to speak directly with me in [place].  They know that they can ask for an interpreter at any time.  Kim Contreras is a 62 y.o. 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  She presents for evaluation of persistent cough that has been going on intermittently for months (at least 5 months).  She states that it will get better for a while and then come back.  She said that she produces phlegm and that it was bad enough today that she felt like she needed to come in.  She said that she coughs hard enough it causes pain in her chest and her back but it does not hurt otherwise.  Sometimes she feels nauseated but not currently.  She thought she might of had a fever at home but she does not have one after coming to the hospital.  She is currently not coughing but does so intermittently during our evaluation.     Physical Exam   Triage Vital Signs: ED Triage Vitals  Enc Vitals Group     BP 05/12/22 0157 (!) 173/78     Pulse Rate 05/12/22 0157 82     Resp 05/12/22 0157 16     Temp 05/12/22 0157 97.8 F (36.6 C)     Temp Source 05/12/22 0157 Oral     SpO2 05/12/22 0157 98 %     Weight 05/12/22 0158 63.5 kg (140 lb)     Height 05/12/22 0158 1.524 m (5')     Head Circumference --      Peak Flow --      Pain Score 05/12/22 0158 10     Pain Loc --      Pain Edu? --      Excl. in [place]? --     Most recent vital signs: Vitals:   05/12/22 0157  BP: (!) 173/78  Pulse: 82  Resp: 16  Temp: 97.8 F (36.6 C)  SpO2: 98%     General: Awake, no distress.  Generally well-appearing. CV:  Good peripheral perfusion.  Regular  rate and rhythm.  Normal heart sounds. Resp:  Normal effort. Speaking easily and comfortably, no accessory muscle usage nor intercostal retractions.  Lungs are clear to auscultation.  No wheezing.  Patient has occasional "junky" sounding cough but relatively mild and self-limiting. Abd:  No distention.  No to nurse to palpation.   ED Results / Procedures / Treatments   Labs (all labs ordered are listed, but only abnormal results are displayed) Labs Reviewed  COMPREHENSIVE METABOLIC PANEL - Abnormal; Notable for the following components:      Result Value   Glucose, Bld 102 (*)    All other components within normal limits  CBC  LIPASE, BLOOD  POC URINE PREG, ED  TROPONIN I (HIGH SENSITIVITY)     EKG  ED ECG REPORT I, Hinda Kehr, the attending physician, personally viewed and interpreted this ECG.  Date: 05/12/2022 EKG Time: 1:57 AM Rate: 76 Rhythm: normal sinus rhythm QRS Axis: normal Intervals: normal ST/T Wave abnormalities: normal Narrative Interpretation: no evidence of acute ischemia  RADIOLOGY I viewed and interpreted the patient's two-view chest x-ray.  I see no evidence of pneumonia or pulmonary edema.  I also read the radiologist's report, which confirmed no acute findings.    PROCEDURES:  Critical Care performed: No  Procedures   MEDICATIONS ORDERED IN ED: Medications  chlorpheniramine-HYDROcodone (TUSSIONEX) 10-[name]/5ML suspension 5 mL (5 mLs Oral Given 05/12/22 0417)     IMPRESSION / MDM / ASSESSMENT AND PLAN / ED COURSE  I reviewed the triage vital signs and the nursing notes.                              Differential diagnosis includes, but is not limited to, bronchitis, pneumonia, inhaled foreign body, allergies, COPD, asthma.  Patient's presentation is most consistent with acute presentation with potential threat to life or bodily function.  Vital signs are notable for hypertension but otherwise unremarkable.  The patient is on the  cardiac monitor to evaluate for evidence of arrhythmia and/or significant heart rate changes.  Labs pending.  As document above, chest x-ray is clear with no evidence of pneumonia.  Her symptoms have been going on for an extended period of time.  It sounds as if the patient has subacute or chronic bronchitis.  She does not smoke and said that she tried using albuterol 3 times a day but it does not seem to help.  She does not seem to have COPD on physical exam, just a chronic cough that is very frustrating to her.  Labs/studies ordered: Comprehensive metabolic panel, CBC, high-sensitivity troponin, lipase, two-view chest x-ray, EKG Interventions/Medications given: Tussionex 5 mL [facility] Course my include additional interventions not listed in this section:)  Assuming that the labs are reassuring, anticipate discharge and outpatient follow-up.  I explained to her that I cannot fix the cough as sometimes these postviral coughs continue for an extended period of time.  However, I checked the [place] controlled substance database and there are no particularly concerning prescribing habits, although she has had repeated prescriptions for hydrocodone and oxycodone in the past as well as some benzos.  However I think it would be appropriate to try a prescription for Tussionex and have her follow-up with her primary care provider to see if a referral to pulmonology from her PCP would be appropriate.   Clinical Course as of 05/12/22 0433  Mon May 12, 2022  0400 Troponin I (High Sensitivity): <2 [CF]  [phone] Labs are all within normal limits.  There is no indication to repeat high-sensitivity troponin given that she is at low risk for ACS based on HEAR score.  I updated the patient with the plan and she understands and agrees although she is clearly frustrated about the persistent cough.  I gave my usual and customary return precautions. [CF]  [id number] Of note, the patient's blood pressure was quite elevated  tonight, but she is on antihypertensives and has a PCP.  I recommended that she follow-up with her PCP at the next fillable opportunity and continue taking her medications. [CF]    Clinical Course User Index [CF] Hinda Kehr, MD     FINAL CLINICAL IMPRESSION(S) / ED DIAGNOSES   Final diagnoses:  Chronic cough  Uncontrolled hypertension     Rx / DC Orders   ED Discharge Orders          Ordered    chlorpheniramine-HYDROcodone (TUSSIONEX) 10-[name]/5ML  Every 12 hours PRN  05/12/22 0433             Note:  This document was prepared using Dragon voice recognition software and may include unintentional dictation errors.   Hinda Kehr, MD 05/12/22 [id number]

## 2022-05-12 NOTE — ED Triage Notes (Signed)
Pt to ED from home for coughing and wheezing from my chest. Pt has back pain and CP. Pt was just seen in January for same. Pt advised this is different. Pt states "I have phlegm that doesn't go away". Pt is CAOx4 and in no acute distress at this time and ambulatory in triage.

## 2022-05-20 ENCOUNTER — Other Ambulatory Visit: Payer: Self-pay | Admitting: Primary Care

## 2022-05-20 DIAGNOSIS — Z1231 Encounter for screening mammogram for malignant neoplasm of breast: Secondary | ICD-10-CM

## 2022-05-28 ENCOUNTER — Other Ambulatory Visit: Admission: RE | Admit: 2022-05-28 | Payer: BLUE CROSS/BLUE SHIELD | Source: Ambulatory Visit

## 2022-05-28 ENCOUNTER — Ambulatory Visit
Admission: RE | Admit: 2022-05-28 | Discharge: 2022-05-28 | Disposition: A | Discharge status: Home / Self Care | Payer: BLUE CROSS/BLUE SHIELD | Source: Ambulatory Visit | Attending: Internal Medicine | Admitting: Internal Medicine

## 2022-05-28 ENCOUNTER — Inpatient Hospital Stay: Payer: BLUE CROSS/BLUE SHIELD | Attending: Internal Medicine

## 2022-05-28 DIAGNOSIS — C8238 Follicular lymphoma grade IIIa, lymph nodes of multiple sites: Secondary | ICD-10-CM

## 2022-05-28 LAB — CBC WITH DIFFERENTIAL/PLATELET
Abs Immature Granulocytes: 0.01 10*3/uL (ref 0.00–0.07)
Basophils Absolute: 0 10*3/uL (ref 0.0–0.1)
Basophils Relative: 1 %
Eosinophils Absolute: 0.1 10*3/uL (ref 0.0–0.5)
Eosinophils Relative: 2 %
HCT: 41.2 % (ref 36.0–46.0)
Hemoglobin: 14 g/dL (ref 12.0–15.0)
Immature Granulocytes: 0 %
Lymphocytes Relative: 36 %
Lymphs Abs: 1.5 10*3/uL (ref 0.7–4.0)
MCH: 32.8 pg (ref 26.0–34.0)
MCHC: 34 g/dL (ref 30.0–36.0)
MCV: 96.5 fL (ref 80.0–100.0)
Monocytes Absolute: 0.2 10*3/uL (ref 0.1–1.0)
Monocytes Relative: 6 %
Neutro Abs: 2.4 10*3/uL (ref 1.7–7.7)
Neutrophils Relative %: 55 %
Platelets: 235 10*3/uL (ref 150–400)
RBC: 4.27 MIL/uL (ref 3.87–5.11)
RDW: 13.1 % (ref 11.5–15.5)
WBC: 4.3 10*3/uL (ref 4.0–10.5)
nRBC: 0 % (ref 0.0–0.2)

## 2022-05-28 LAB — COMPREHENSIVE METABOLIC PANEL
ALT: 12 U/L (ref 0–44)
AST: 16 U/L (ref 15–41)
Albumin: 4.6 g/dL (ref 3.5–5.0)
Alkaline Phosphatase: 82 U/L (ref 38–126)
Anion gap: 8 (ref 5–15)
BUN: 24 mg/dL — ABNORMAL HIGH (ref 6–20)
CO2: 27 mmol/L (ref 22–32)
Calcium: 9.3 mg/dL (ref 8.9–10.3)
Chloride: 103 mmol/L (ref 98–111)
Creatinine, Ser: 0.94 mg/dL (ref 0.44–1.00)
GFR, Estimated: >60 mL/min (ref >60)
Glucose, Bld: 101 mg/dL — ABNORMAL HIGH (ref 70–99)
Potassium: 4.1 mmol/L (ref 3.5–5.1)
Sodium: 138 mmol/L (ref 135–145)
Total Bilirubin: 0.6 mg/dL (ref 0.3–1.2)
Total Protein: 8.7 g/dL — ABNORMAL HIGH (ref 6.5–8.1)

## 2022-05-28 LAB — LACTATE DEHYDROGENASE: LDH: 135 U/L (ref 98–192)

## 2022-05-28 MED ORDER — IOHEXOL 300 MG/ML  SOLN
100.0000 mL | Freq: Once | INTRAMUSCULAR | Status: AC | PRN
  Administered 2022-05-28: 100 mL via INTRAVENOUS

## 2022-06-02 ENCOUNTER — Ambulatory Visit: Payer: BLUE CROSS/BLUE SHIELD | Admitting: Student in an Organized Health Care Education/Training Program

## 2022-06-02 ENCOUNTER — Encounter: Payer: Self-pay | Admitting: Student in an Organized Health Care Education/Training Program

## 2022-06-02 VITALS — BP 136/82 | HR 78 | Temp 97.8°F | Ht 60.0 in | Wt 140.4 lb

## 2022-06-02 DIAGNOSIS — R0602 Shortness of breath: Secondary | ICD-10-CM

## 2022-06-02 DIAGNOSIS — J4540 Moderate persistent asthma, uncomplicated: Secondary | ICD-10-CM

## 2022-06-02 MED ORDER — BUDESONIDE-FORMOTEROL FUMARATE 160-4.5 MCG/ACT IN AERO: 2.0000 | INHALATION_SPRAY | Freq: Two times a day (BID) | RESPIRATORY_TRACT | 12 refills | Status: DC

## 2022-06-02 NOTE — Progress Notes (Signed)
Synopsis: Referred in for cough by Freddy Finner, NP  Assessment & Plan:   1. Shortness of breath  Presents for the evaluation of cough and wheezing for 3 months in duration with history suggestive and concerning for asthma. Imaging performed last week shows her lung parenchyma to be clear with no infiltrates or findings to suggest infection or ILD. Strong family history is also supportive of Asthma.  I will obtain a pulmonary function test to assess spirometry, lung volumes, and DLCO. I will also initiate empiric management with Symbicort twice daily. I will be seeing the patient in short term follow up after her PFT's to assess for response.  - Pulmonary Function Test [facility] Only; Future - budesonide-formoterol (SYMBICORT) 160-4.5 MCG/ACT inhaler; Inhale 2 puffs into the lungs in the morning and at bedtime.  Dispense: 1 each; Refill: 12   Return in about 3 months (around 09/02/2022).  I spent 60 minutes caring for this patient today, including preparing to see the patient, obtaining a medical history , reviewing a separately obtained history, performing a medically appropriate examination and/or evaluation, counseling and educating the patient/family/caregiver, ordering medications, tests, or procedures, documenting clinical information in the electronic health record, and independently interpreting results (not separately reported/billed) and communicating results to the patient/family/caregiver  Armando Reichert, MD [facility] 06/02/2022 5:46 PM    End of visit medications:  Meds ordered this encounter  Medications   budesonide-formoterol (SYMBICORT) 160-4.5 MCG/ACT inhaler    Sig: Inhale 2 puffs into the lungs in the morning and at bedtime.    Dispense:  1 each    Refill:  12     Current Outpatient Medications:    albuterol (VENTOLIN HFA) 108 (90 Base) MCG/ACT inhaler, Inhale into the lungs., Disp: , Rfl:    budesonide-formoterol (SYMBICORT) 160-4.5 MCG/ACT  inhaler, Inhale 2 puffs into the lungs in the morning and at bedtime., Disp: 1 each, Rfl: 12   clonazePAM (KLONOPIN) 0.5 MG tablet, Take 0.5 mg by mouth daily as needed., Disp: , Rfl:    esomeprazole (NEXIUM) 40 MG capsule, Take 40 mg by mouth daily., Disp: , Rfl:    levocetirizine (XYZAL) 5 MG tablet, Take 5 mg by mouth every evening., Disp: , Rfl:    losartan (COZAAR) 50 MG tablet, Take 50 mg by mouth daily., Disp: , Rfl:    montelukast (SINGULAIR) 10 MG tablet, Take 10 mg by mouth every evening., Disp: , Rfl:    cetirizine (ZYRTEC) 10 MG tablet, Take 1 tablet (10 mg total) by mouth daily. (Patient not taking: Reported on 06/02/2022), Disp: 30 tablet, Rfl: 0   chlorpheniramine-HYDROcodone (TUSSIONEX) 10-[name]/5ML, Take 5 mLs by mouth every 12 (twelve) hours as needed for cough. (Patient not taking: Reported on 06/02/2022), Disp: 115 mL, Rfl: 0   fluticasone (FLONASE) 50 MCG/ACT nasal spray, Place 1 spray into both nostrils 2 (two) times daily. (Patient not taking: Reported on 06/02/2022), Disp: 16 g, Rfl: 0   gabapentin (NEURONTIN) 300 MG capsule, Take 600 mg by mouth 2 (two) times daily. (Patient not taking: Reported on 06/02/2022), Disp: , Rfl:    hydrOXYzine (ATARAX) 10 MG tablet, Take 10 mg by mouth 3 (three) times daily. (Patient not taking: Reported on 06/02/2022), Disp: , Rfl:    meloxicam (MOBIC) 15 MG tablet, Take 1 tablet (15 mg total) by mouth daily. (Patient not taking: Reported on 06/02/2022), Disp: 30 tablet, Rfl: 0   methocarbamol (ROBAXIN) 500 MG tablet, Take 1 tablet (500 mg total) by mouth at  bedtime as needed (neck pain). (Patient not taking: Reported on 06/02/2022), Disp: 30 tablet, Rfl: 1   pantoprazole (PROTONIX) 40 MG tablet, Take 1 tablet (40 mg total) by mouth daily. (Patient not taking: Reported on 10/18/2021), Disp: 30 tablet, Rfl: 3   sertraline (ZOLOFT) 50 MG tablet, Take by mouth. (Patient not taking: Reported on 06/02/2022), Disp: , Rfl:  No current facility-administered medications for  this visit.  Facility-Administered Medications Ordered in Other Visits:    sodium chloride flush (NS) 0.9 % injection 10 mL, 10 mL, Intravenous, PRN, [name], MD, 10 mL at 12/28/19 0815   Subjective:   PATIENT ID: Kim Contreras GENDER: female DOB: 1960/10/28, MRN: [id number]  Chief Complaint  Patient presents with   Consult    Cough since 02/25/2022. Went to the doctor and tested negative for covid and flu. Yellow/clear/white sputum at times. ED on 04/23/2022 and 05/12/2022 for the cough.     HPI  The patient is a 62 year old [place] speaking female presenting to clinic for the evaluation of cough.  Patient reports symptoms that started in late November and have persisted since. Her cough was initially persistent and productive of sputum, and is currently dry. She continues to feel that her chest is junky and is unable to bring up the phlegm. She has a wheeze that she has noticed. She also reports exertional dyspnea. This is the first time this has happened to her. Patient reports strong seasonal allergies since moving to the [place]. She has never similar symptoms of cough or shortness of breath, however. She was given an albuterol inhaler without much help. She was seen in the ED recently for cough and discharged with a pulmonary referral. Patient is also seen by oncology for follicular lymphoma s/p chemotherapy.  She is originally from [place], and has been in the [place] for 30 years. She works at a Secretary/administrator (wrapping bottles in boxes) and denies any exposure to the manufacturing side of things. She is a non-smoker, and denies any other exposures. She has a brother and a sister who have asthma.  Ancillary information including prior medications, full medical/surgical/family/social histories, and PFTs (when available) are listed below and have been reviewed.   Review of Systems  Constitutional:  Negative for chills and fever.  Respiratory:  Positive for cough, shortness of  breath and wheezing. Negative for hemoptysis and sputum production.   Cardiovascular:  Negative for chest pain.  Skin:  Positive for rash.     Objective:   Vitals:   06/02/22 1607  BP: 136/82  Pulse: 78  Temp: 97.8 F (36.6 C)  SpO2: 99%  Weight: 140 lb 6.4 oz (63.7 kg)  Height: 5' (1.524 m)   99% on RA  BMI Readings from Last 3 Encounters:  06/02/22 27.42 kg/m  05/12/22 27.34 kg/m  12/04/21 27.03 kg/m   Wt Readings from Last 3 Encounters:  06/02/22 140 lb 6.4 oz (63.7 kg)  05/12/22 140 lb (63.5 kg)  12/04/21 138 lb 6.4 oz (62.8 kg)    Physical Exam Constitutional:      General: She is not in acute distress.    Appearance: Normal appearance. She is not ill-appearing.  HENT:     Head: Normocephalic.     Nose: Nose normal. No congestion.  Cardiovascular:     Rate and Rhythm: Normal rate and regular rhythm.  Pulmonary:     Effort: Pulmonary effort is normal.     Breath sounds: Wheezing and rhonchi present. No rales.  Abdominal:     Palpations: Abdomen is soft.  Neurological:     General: No focal deficit present.     Mental Status: She is alert and oriented to person, place, and time. Mental status is at baseline.     Ancillary Information    Past Medical History:  Diagnosis Date   Anemia 2006   Anxiety    Arthritis    Diverticulosis    Helicobacter pylori gastritis    History of hiatal hernia    Hypertension    Lymphoma ([place])      Family History  Problem Relation Age of Onset   Breast cancer Other 59   Prostate cancer Neg Hx    Bladder Cancer Neg Hx    Kidney cancer Neg Hx      Past Surgical History:  Procedure Laterality Date   APPENDECTOMY     CHOLECYSTECTOMY N/A 01/19/2015   Procedure: LAPAROSCOPIC CHOLECYSTECTOMY;  [name], MD;  [name];  Service: General;  [name];   COLONOSCOPY WITH PROPOFOL N/A 10/27/2014   Procedure: COLONOSCOPY WITH PROPOFOL;  [name], MD;  Location:  [facility] ENDOSCOPY;  Service: Endoscopy;  [name];   COLONOSCOPY WITH PROPOFOL N/A 02/05/2021   Procedure: COLONOSCOPY WITH PROPOFOL;  [name], MD;  [name];  Service: Endoscopy;  [name];   ESOPHAGOGASTRODUODENOSCOPY     ESOPHAGOGASTRODUODENOSCOPY (EGD) WITH PROPOFOL N/A 02/05/2021   Procedure: ESOPHAGOGASTRODUODENOSCOPY (EGD) WITH PROPOFOL;  [name], MD;  [name];  Service: Endoscopy;  [name];   EXCISION MASS ABDOMINAL N/A 06/03/2019   Procedure: EXCISION MASS ABDOMINAL;  [name], MD;  [name];  Service: General;  [name];  abdominal node biopsy   NASAL SINUS SURGERY     PORT-A-CATH REMOVAL  09/2020   by Dr. Towanda Malkin PLACEMENT Left 07/08/2019   Procedure: INSERTION PORT-A-CATH;  [name], MD;  [name];  Service: General;  Laterality: Left;   TOE AMPUTATION Left 11/28/2021   middle toe   TUBAL LIGATION      Social History   Socioeconomic History   Marital status: Married    Spouse name: Not on file   Number of children: Not on file   Years of education: Not on file   Highest education level: Not on file  Occupational History   Not on file  Tobacco Use   Smoking status: Never   Smokeless tobacco: Never  Vaping Use   Vaping Use: Never used  Substance and Sexual Activity   Alcohol use: No   Drug use: No   Sexual activity: Not on file  Other Topics Concern   Not on file  Social History Narrative   Not on file   Social Determinants of Health   Financial Resource Strain: Not on file  Food Insecurity: Not on file  Transportation Needs: Not on file  Physical Activity: Not on file  Stress: Not on file  Social Connections: Not on file  Intimate Partner Violence: Not on file     Allergies  Allergen Reactions   Chlorhexidine     Not required   Pseudoephedrine Hcl Other (See Comments)     CBC     Component Value Date/Time   WBC 4.3 05/28/2022 0907   RBC 4.27 05/28/2022 0907   HGB 14.0 05/28/2022 0907   HCT 41.2 05/28/2022 0907   PLT 235 05/28/2022 0907   MCV 96.5  05/28/2022 0907   MCH 32.8 05/28/2022 0907   MCHC 34.0 05/28/2022 0907   RDW 13.1 05/28/2022 0907   LYMPHSABS 1.5 05/28/2022 0907   MONOABS 0.2 05/28/2022 0907   EOSABS 0.1 05/28/2022 0907   BASOSABS 0.0 05/28/2022 0907    Pulmonary Functions Testing Results:     No data to display          Outpatient Medications Prior to Visit  Medication Sig Dispense Refill   albuterol (VENTOLIN HFA) 108 (90 Base) MCG/ACT inhaler Inhale into the lungs.     clonazePAM (KLONOPIN) 0.5 MG tablet Take 0.5 mg by mouth daily as needed.     esomeprazole (NEXIUM) 40 MG capsule Take 40 mg by mouth daily.     levocetirizine (XYZAL) 5 MG tablet Take 5 mg by mouth every evening.     losartan (COZAAR) 50 MG tablet Take 50 mg by mouth daily.     montelukast (SINGULAIR) 10 MG tablet Take 10 mg by mouth every evening.     cetirizine (ZYRTEC) 10 MG tablet Take 1 tablet (10 mg total) by mouth daily. (Patient not taking: Reported on 06/02/2022) 30 tablet 0   chlorpheniramine-HYDROcodone (TUSSIONEX) 10-[name]/5ML Take 5 mLs by mouth every 12 (twelve) hours as needed for cough. (Patient not taking: Reported on 06/02/2022) 115 mL 0   fluticasone (FLONASE) 50 MCG/ACT nasal spray Place 1 spray into both nostrils 2 (two) times daily. (Patient not taking: Reported on 06/02/2022) 16 g 0   gabapentin (NEURONTIN) 300 MG capsule Take 600 mg by mouth 2 (two) times daily. (Patient not taking: Reported on 06/02/2022)     hydrOXYzine (ATARAX) 10 MG tablet Take 10 mg by mouth 3 (three) times daily. (Patient not taking: Reported on 06/02/2022)     meloxicam (MOBIC) 15 MG tablet Take 1 tablet (15 mg total) by mouth daily. (Patient not taking: Reported on 06/02/2022) 30 tablet 0   methocarbamol (ROBAXIN) 500 MG tablet Take 1 tablet (500 mg total) by mouth at bedtime as needed  (neck pain). (Patient not taking: Reported on 06/02/2022) 30 tablet 1   pantoprazole (PROTONIX) 40 MG tablet Take 1 tablet (40 mg total) by mouth daily. (Patient not taking: Reported on 10/18/2021) 30 tablet 3   sertraline (ZOLOFT) 50 MG tablet Take by mouth. (Patient not taking: Reported on 06/02/2022)     Facility-Administered Medications Prior to Visit  Medication Dose Route Frequency Provider Last Rate Last Admin   sodium chloride flush (NS) 0.9 % injection 10 mL  10 mL Intravenous PRN [name], MD   10 mL at 12/28/19 0815

## 2022-06-04 ENCOUNTER — Ambulatory Visit
Admission: RE | Admit: 2022-06-04 | Discharge: 2022-06-04 | Disposition: A | Discharge status: Home / Self Care | Payer: BLUE CROSS/BLUE SHIELD | Source: Ambulatory Visit | Attending: Primary Care | Admitting: Primary Care

## 2022-06-04 ENCOUNTER — Inpatient Hospital Stay: Payer: BLUE CROSS/BLUE SHIELD | Attending: Internal Medicine | Admitting: Internal Medicine

## 2022-06-04 ENCOUNTER — Other Ambulatory Visit: Payer: BLUE CROSS/BLUE SHIELD

## 2022-06-04 DIAGNOSIS — M549 Dorsalgia, unspecified: Secondary | ICD-10-CM | POA: Insufficient documentation

## 2022-06-04 DIAGNOSIS — Z1231 Encounter for screening mammogram for malignant neoplasm of breast: Secondary | ICD-10-CM | POA: Insufficient documentation

## 2022-06-04 DIAGNOSIS — G8929 Other chronic pain: Secondary | ICD-10-CM | POA: Diagnosis not present

## 2022-06-04 DIAGNOSIS — D8481 Immunodeficiency due to conditions classified elsewhere: Secondary | ICD-10-CM | POA: Diagnosis not present

## 2022-06-04 DIAGNOSIS — Z803 Family history of malignant neoplasm of breast: Secondary | ICD-10-CM | POA: Diagnosis not present

## 2022-06-04 DIAGNOSIS — J45909 Unspecified asthma, uncomplicated: Secondary | ICD-10-CM | POA: Diagnosis not present

## 2022-06-04 DIAGNOSIS — I10 Essential (primary) hypertension: Secondary | ICD-10-CM | POA: Insufficient documentation

## 2022-06-04 DIAGNOSIS — C8238 Follicular lymphoma grade IIIa, lymph nodes of multiple sites: Secondary | ICD-10-CM

## 2022-06-04 DIAGNOSIS — B0229 Other postherpetic nervous system involvement: Secondary | ICD-10-CM | POA: Diagnosis not present

## 2022-06-04 DIAGNOSIS — M2550 Pain in unspecified joint: Secondary | ICD-10-CM | POA: Diagnosis not present

## 2022-06-04 NOTE — Assessment & Plan Note (Addendum)
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 S/p Obi-benda x6 cylces- FEB 2024 CT SCAN- No findings of adenopathy or active lymphoma. Stable stranding in the central mesentery compatible with treated lymphoma.   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  Currently status close patient vaccination pneumonia 2023 and also COVID/flu.   IgG 2023- WNL.  recommend vit C 500 mg; and recommend vit D 1000/units. Will check immunoglobulins at next visit.  # Right flank shingles/postherpetic neuralgia.  Off gabapentin/cymbalta-  stable.   # Asthma- [Dr.Dgyali] cough- inhalers- continue Nexium [reflux on CT scan]-stable.  # chronic pain right quadrant abdmonal pain/chornic- EGD/[facility] [NOV 2022- ] in nov 2022 [2016-colo]- stable.   #Incidental findings on Imaging  CT , 2024: Colonic diverticulosis without findings of acute diverticulitis; Gas fluid levels in a patulous esophagus, suggestive of gastroesophageal reflux;  Small supraumbilical fat containing hernia.I reviewed/discussed/counseled the patient.   # IV access: Mediport explanted; PIV  # DISPOSITION:  # Follow up in 6 months-- ;MD; labs- cbc/cmp;LDH; Quantitative immunoglobulins; vit D 25-OH - Dr.B   # I reviewed the blood work- with the patient in detail; also reviewed the imaging independently [as summarized above]; and with the patient in detail.

## 2022-06-04 NOTE — Progress Notes (Signed)
Kim Contreras NOTE  Patient Care Team: Center, [facility] as PCP - General ([place]) Kim Sickle, MD as Consulting Physician (Hematology and Oncology) Kim Castilla Forest Gleason, MD as Consulting Physician (General Surgery)  CHIEF COMPLAINTS/PURPOSE OF CONSULTATION: Lymphoma   Oncology History Overview Note  # 5th FEB 2021- ABDOMINAL MASS-  9.3 x 3.0 cm central mesenteric soft tissue mass is identified on image 38/series 2. 3.0x 3.0 cm collar of soft tissue surrounds branches of the superior mesenteric artery and vein on image 47/2 with relatively little mass-effect on the vascular anatomy. 2.0 x 1.5 cm nodular component of this abnormal soft tissue is identified in the mesentery of the right pelvis on 55/2. FEB 2021- PET-  PET scan-February 2021-SUV around 5.5 again highly suggestive of malignancy lymphoma.  Small cluster left supra pelvic lymph node/small retroperitoneal lymph nodes-5 mm-Douville score 3.   # MARCH [id number]- grade 3A follicular lymphoma [Open biopsy Dr. Burnett-] STAGE II vs III (equivocal neck lymph node) [no bone marrow bx - #Status post-Gazyva-Bendamustine x6 cycles [finished August 2021]- PET scan February, 2022-treated disease noted to have any progression.  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  Kim Contreras 62 y.o.  female with follicular lymphoma grade 3 status post Gazyva-Benda-with intermittent neutropenia currently on surveillance is here for follow-up/review results of CT scan.  Last chemotherapy in February  2022.   No further episodes of pneumonia. She continues to complain of chronic fatigue. Complains of reflux like symptoms. No nausea or vomiting.   Patient has chronic joint pains back pain.  Not any worse.   Review of Systems  Constitutional:  Positive for malaise/fatigue. Negative for chills, diaphoresis, [name], hemoptysis, sputum production, [name], palpitations, [name], diarrhea, heartburn, melena and vomiting.  Musculoskeletal:  Positive for back pain and joint pain.  Skin: Negative.  Negative for itching and rash.  Neurological:  Positive for tingling. Negative for dizziness, focal weakness and weakness.  Endo/Heme/Allergies:  Does not bruise/bleed easily.  Psychiatric/Behavioral:  Negative for depression. The patient has insomnia. The patient is not nervous/anxious.      MEDICAL HISTORY:  Past Medical History:  Diagnosis Date   Anemia 2006   Anxiety    Arthritis    Diverticulosis    Helicobacter pylori gastritis    History of hiatal hernia    Hypertension    Lymphoma ([place])     SURGICAL HISTORY: Past Surgical History:  Procedure Laterality Date   APPENDECTOMY     CHOLECYSTECTOMY N/A 01/19/2015   Procedure: LAPAROSCOPIC CHOLECYSTECTOMY;  [name], MD;  [name];  Service: General;  [name];   COLONOSCOPY WITH PROPOFOL N/A 10/27/2014   Procedure: COLONOSCOPY WITH PROPOFOL;  [name], MD;  Location: [facility] ENDOSCOPY;  Service: Endoscopy;  [name];  COLONOSCOPY WITH PROPOFOL N/A 02/05/2021   Procedure: COLONOSCOPY WITH PROPOFOL;  [name], MD;  [name];  Service: Endoscopy;   [name];   ESOPHAGOGASTRODUODENOSCOPY     ESOPHAGOGASTRODUODENOSCOPY (EGD) WITH PROPOFOL N/A 02/05/2021   Procedure: ESOPHAGOGASTRODUODENOSCOPY (EGD) WITH PROPOFOL;  [name], MD;  [name];  Service: Endoscopy;  [name];   EXCISION MASS ABDOMINAL N/A 06/03/2019   Procedure: EXCISION MASS ABDOMINAL;  [name], MD;  [name];  Service: General;  [name];  abdominal node biopsy   NASAL SINUS SURGERY     PORT-A-CATH REMOVAL  09/2020   by Dr. Towanda Malkin PLACEMENT Left 07/08/2019   Procedure: INSERTION PORT-A-CATH;  [name], MD;  [name];  Service: General;  Laterality: Left;   TOE AMPUTATION Left 11/28/2021   middle toe   TUBAL LIGATION      SOCIAL HISTORY: Social History   Socioeconomic History   Marital status: Married    Spouse name: Not on file   Number of children: Not on file   Years of education: Not on file   Highest education level: Not on file  Occupational History   Not on file  Tobacco Use   Smoking status: Never   Smokeless tobacco: Never  Vaping Use   Vaping Use: Never used  Substance and Sexual Activity   Alcohol use: No   Drug use: No   Sexual activity: Not on file  Other Topics Concern   Not on file  Social History Narrative   Not on file   Social Determinants of Health   Financial Resource Strain: Not on file  Food Insecurity: Not on file  Transportation Needs: Not on file  Physical Activity: Not on file  Stress: Not on file  Social Connections: Not on file  Intimate Partner Violence: Not on file    FAMILY HISTORY: Family History  Problem Relation Age of Onset   Breast cancer Other 47   Prostate cancer Neg Hx    Bladder Cancer Neg Hx    Kidney cancer Neg Hx     ALLERGIES:  is allergic to chlorhexidine and pseudoephedrine hcl.  MEDICATIONS:  Current Outpatient Medications  Medication Sig Dispense Refill    budesonide-formoterol (SYMBICORT) 160-4.5 MCG/ACT inhaler Inhale 2 puffs into the lungs in the morning and at bedtime. 1 each 12   esomeprazole (NEXIUM) 40 MG capsule Take 40 mg by mouth daily.     losartan (COZAAR) 50 MG tablet Take 50 mg by mouth daily.     montelukast (SINGULAIR) 10 MG tablet Take 10 mg by mouth every evening.     albuterol (VENTOLIN HFA) 108 (90 Base) MCG/ACT inhaler Inhale into the lungs. (Patient not taking: Reported on 06/04/2022)     cetirizine (ZYRTEC) 10 MG tablet Take 1 tablet (10 mg total) by mouth daily. (Patient not taking: Reported on 06/02/2022) 30 tablet 0   chlorpheniramine-HYDROcodone (TUSSIONEX) 10-[name]/5ML Take 5 mLs by mouth every 12 (twelve) hours as needed for cough. (Patient not taking: Reported on 06/02/2022) 115 mL 0   clonazePAM (KLONOPIN) 0.5 MG tablet Take 0.5 mg by mouth daily as needed. (Patient not taking: Reported on 06/04/2022)     fluticasone (FLONASE) 50 MCG/ACT nasal spray Place 1 spray into both nostrils 2 (two) times daily. (Patient not taking: Reported on 06/02/2022) 16 g 0   gabapentin (NEURONTIN) 300 MG capsule Take 600 mg by mouth 2 (two) times daily. (Patient  not taking: Reported on 06/02/2022)     hydrOXYzine (ATARAX) 10 MG tablet Take 10 mg by mouth 3 (three) times daily. (Patient not taking: Reported on 06/02/2022)     levocetirizine (XYZAL) 5 MG tablet Take 5 mg by mouth every evening. (Patient not taking: Reported on 06/04/2022)     meloxicam (MOBIC) 15 MG tablet Take 1 tablet (15 mg total) by mouth daily. (Patient not taking: Reported on 06/02/2022) 30 tablet 0   methocarbamol (ROBAXIN) 500 MG tablet Take 1 tablet (500 mg total) by mouth at bedtime as needed (neck pain). (Patient not taking: Reported on 06/02/2022) 30 tablet 1   pantoprazole (PROTONIX) 40 MG tablet Take 1 tablet (40 mg total) by mouth daily. (Patient not taking: Reported on 10/18/2021) 30 tablet 3   sertraline (ZOLOFT) 50 MG tablet Take by mouth. (Patient not taking: Reported on 06/02/2022)      No current facility-administered medications for this visit.   Facility-Administered Medications Ordered in Other Visits  Medication Dose Route Frequency Provider Last Rate Last Admin   sodium chloride flush (NS) 0.9 % injection 10 mL  10 mL Intravenous PRN Kim Sickle, MD   10 mL at 12/28/19 0815       PHYSICAL EXAMINATION: ECOG PERFORMANCE STATUS: 0 - Asymptomatic  Vitals:   06/04/22 1527  BP: (!) 144/96  Pulse: 66  Resp: 15  Temp: 98 F (36.7 C)  SpO2: 99%   Filed Weights   06/04/22 1527  Weight: 139 lb (63 kg)   Patient in wheelchair because of left foot trauma.  Physical Exam HENT:     Head: Normocephalic and atraumatic.     Mouth/Throat:     Pharynx: No oropharyngeal exudate.  Eyes:     Pupils: Pupils are equal, round, and reactive to light.  Cardiovascular:     Rate and Rhythm: Normal rate and regular rhythm.  Pulmonary:     Effort: Pulmonary effort is normal. No respiratory distress.     Breath sounds: Normal breath sounds. No wheezing.  Abdominal:     General: Bowel sounds are normal. There is no distension.     Palpations: Abdomen is soft. There is no mass.     Tenderness: There is no abdominal tenderness. There is no guarding or rebound.  Musculoskeletal:        General: No tenderness. Normal range of motion.     Cervical back: Normal range of motion and neck supple.     Comments: Mild swelling of the left lower extremity; mild tenderness noted.  Skin:    General: Skin is warm.  Neurological:     Mental Status: She is alert and oriented to person, place, and time.  Psychiatric:        Mood and Affect: Affect normal.      LABORATORY DATA:  I have reviewed the data as listed Lab Results  Component Value Date   WBC 4.3 05/28/2022   HGB 14.0 05/28/2022   HCT 41.2 05/28/2022   MCV 96.5 05/28/2022   PLT 235 05/28/2022   Recent Labs    12/04/21 0941 04/23/22 2009 05/12/22 0258 05/28/22 0907  NA 135 137 140 138  K 3.8 3.9  4.1 4.1  CL 103 102 107 103  CO2 '27 24 24 27  '$ GLUCOSE 115* 91 102* 101*  BUN '14 15 19 '$ 24*  CREATININE 0.73 1.04* 0.94 0.94  CALCIUM 9.2 9.9 9.1 9.3  GFRNONAA >60 >60 >60 >60  PROT 7.6  --  7.5 8.7*  ALBUMIN 4.3  --  4.1 4.6  AST 18  --  18 16  ALT 19  --  12 12  ALKPHOS 84  --  72 82  BILITOT 0.4  --  0.6 0.6    RADIOGRAPHIC STUDIES: I have personally reviewed the radiological images as listed and agreed with the findings in the report. CT CHEST ABDOMEN PELVIS W CONTRAST  Result Date: 05/28/2022 CLINICAL DATA:  History of grade 3 follicular lymphoma. Monitor. * Tracking Code: BO *. EXAM: CT CHEST, ABDOMEN, AND PELVIS WITH CONTRAST TECHNIQUE: Multidetector CT imaging of the chest, abdomen and pelvis was performed following the standard protocol during bolus administration of intravenous contrast. RADIATION DOSE REDUCTION: This exam was performed according to the departmental dose-optimization program which includes automated exposure control, adjustment of the mA and/or kV according to patient size and/or use of iterative reconstruction technique. CONTRAST:  158m OMNIPAQUE IOHEXOL 300 MG/ML  SOLN COMPARISON:  Multiple priors including most recent CT chest abdomen pelvis September 03, 2021 FINDINGS: CT CHEST FINDINGS Cardiovascular: Normal caliber thoracic aorta. No central pulmonary embolus on this nondedicated study. Normal size heart. No significant pericardial effusion/thickening. Mediastinum/Nodes: No suspicious thyroid nodule. No pathologically enlarged mediastinal, hilar or axillary lymph nodes. Gas fluid levels in a patulous esophagus. Lungs/Pleura: Minimal biapical pleuroparenchymal scarring. No suspicious pulmonary nodules or masses. No pleural effusion. No pneumothorax scattered atelectasis/scarring. Musculoskeletal: No aggressive lytic or blastic lesion of bone CT ABDOMEN PELVIS FINDINGS Hepatobiliary: No suspicious hepatic lesion. Gallbladder surgically absent. Similar prominence of the  biliary tree is favored reservoir effect post cholecystectomy. Pancreas: No pancreatic ductal dilation or evidence of acute inflammation Spleen: No splenomegaly or focal splenic lesion. Adrenals/Urinary Tract: Bilateral adrenal glands appear normal. No hydronephrosis. Kidneys demonstrate symmetric enhancement and excretion of contrast material. Urinary bladder is unremarkable for degree of distension. Stomach/Bowel: Radiopaque enteric contrast material traverses the ileocecal valve. Stomach is unremarkable for degree of distension. No pathologic dilation of small or large bowel. Colonic diverticulosis without findings of acute diverticulitis. Vascular/Lymphatic: Normal caliber abdominal aorta. Smooth IVC contours. Similar appearance of the stranding in the central mesentery compatible with treated lymphoma. No new or progressive adenopathy in the abdomen or pelvis. Reproductive: Lobular uterine contour compatible with leiomyomas. No suspicious adnexal mass. Other: No significant abdominopelvic free fluid. Small supraumbilical fat containing hernia. Musculoskeletal: No aggressive lytic or blastic lesion of bone. Lumbar spondylosis. IMPRESSION: 1. Stable examination without new or progressive adenopathy above or below the diaphragm and no splenomegaly. 2. Colonic diverticulosis without findings of acute diverticulitis. 3. Gas fluid levels in a patulous esophagus, suggestive of gastroesophageal reflux. 4. Small supraumbilical fat containing hernia. Electronically Signed   By: JDahlia BailiffM.D.   On: 05/28/2022 11:25   DG Chest 2 View  Result Date: 05/12/2022 CLINICAL DATA:  CP. coughing and wheezing from my chest. Pt has back pain and CP. Pt was just seen in January for same. EXAM: CHEST - 2 VIEW.  Patient is rotated on frontal view. COMPARISON:  Chest x-ray 04/29/2021, head CT 05/08/2020, CT neck 09/03/2021 FINDINGS: The heart and mediastinal contours are unchanged. No focal consolidation. No pulmonary edema. No  pleural effusion. No pneumothorax. No acute osseous abnormality. IMPRESSION: No active cardiopulmonary disease. Electronically Signed   By: MIven FinnM.D.   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 S/p Obi-benda x6  cylces- FEB 2024 CT SCAN- No findings of adenopathy or active lymphoma. Stable stranding in the central mesentery compatible with treated lymphoma.   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  Currently status close patient vaccination pneumonia 2023 and also COVID/flu.   IgG 2023- WNL.  recommend vit C 500 mg; and recommend vit D 1000/units. Will check immunoglobulins at next visit.  # Right flank shingles/postherpetic neuralgia.  Off gabapentin/cymbalta-  stable.   # Asthma- [Dr.Dgyali] cough- inhalers- continue Nexium [reflux on CT scan]-stable.  # chronic pain right quadrant abdmonal pain/chornic- EGD/[facility] [NOV 2022- ] in nov 2022 [2016-colo]- stable.   #Incidental findings on Imaging  CT , 2024: Colonic diverticulosis without findings of acute diverticulitis; Gas fluid levels in a patulous esophagus, suggestive of gastroesophageal reflux;  Small supraumbilical fat containing hernia.I reviewed/discussed/counseled the patient.   # IV access: Mediport explanted; PIV  # DISPOSITION:  # Follow up in 6 months-- ;MD; labs- cbc/cmp;LDH; Quantitative immunoglobulins; vit D 25-OH - Dr.B   # I reviewed the blood work- with the patient in detail; also reviewed the imaging independently [as summarized above]; and with the patient in detail.     All questions were answered. The patient knows to call the clinic with any problems, questions or concerns.    Kim Sickle, MD 06/04/2022 4:18 PM

## 2022-06-04 NOTE — Progress Notes (Signed)
Concerned for her health. Pain on left scapula.

## 2022-06-19 ENCOUNTER — Ambulatory Visit: Payer: BLUE CROSS/BLUE SHIELD | Attending: Student in an Organized Health Care Education/Training Program

## 2022-06-19 DIAGNOSIS — R0602 Shortness of breath: Secondary | ICD-10-CM

## 2022-06-19 LAB — PULMONARY FUNCTION TEST ARMC ONLY
DL/VA % pred: 129 %
DL/VA: 5.6 ml/min/mmHg/L
DLCO unc % pred: 123 %
DLCO unc: 21.57 ml/min/mmHg
FEF 25-75 Post: 3.27 L/sec
FEF 25-75 Pre: 2.74 L/sec
FEF2575-%Change-Post: 19 %
FEF2575-%Pred-Post: 156 %
FEF2575-%Pred-Pre: 131 %
FEV1-%Change-Post: 4 %
FEV1-%Pred-Post: 107 %
FEV1-%Pred-Pre: 102 %
FEV1-Post: 2.32 L
FEV1-Pre: 2.21 L
FEV1FVC-%Change-Post: 8 %
FEV1FVC-%Pred-Pre: 106 %
FEV6-%Change-Post: 8 %
FEV6-%Pred-Post: 95 %
FEV6-%Pred-Pre: 88 %
FEV6-Post: 2.58 L
FEV6-Pre: 2.38 L
FEV6FVC-%Pred-Post: 103 %
FEV6FVC-%Pred-Pre: 103 %
FVC-%Change-Post: -3 %
FVC-%Pred-Post: 91 %
FVC-%Pred-Pre: 95 %
FVC-Post: 2.58 L
FVC-Pre: 2.66 L
Post FEV1/FVC ratio: 90 %
Post FEV6/FVC ratio: 100 %
Pre FEV1/FVC ratio: 83 %
Pre FEV6/FVC Ratio: 100 %
RV % pred: 89 %
RV: 1.62 L
TLC % pred: 96 %
TLC: 4.32 L

## 2022-06-19 MED ORDER — ALBUTEROL SULFATE (2.5 MG/3ML) 0.083% IN NEBU
2.5000 mg | INHALATION_SOLUTION | Freq: Once | RESPIRATORY_TRACT | Status: AC
  Administered 2022-06-19: 2.5 mg via RESPIRATORY_TRACT

## 2022-07-07 ENCOUNTER — Encounter: Payer: Self-pay | Admitting: Student in an Organized Health Care Education/Training Program

## 2022-07-07 ENCOUNTER — Ambulatory Visit (INDEPENDENT_AMBULATORY_CARE_PROVIDER_SITE_OTHER): Payer: BLUE CROSS/BLUE SHIELD | Admitting: Student in an Organized Health Care Education/Training Program

## 2022-07-07 VITALS — BP 138/84 | HR 72 | Temp 97.7°F | Ht 60.0 in | Wt 143.0 lb

## 2022-07-07 DIAGNOSIS — F419 Anxiety disorder, unspecified: Secondary | ICD-10-CM

## 2022-07-07 DIAGNOSIS — R0602 Shortness of breath: Secondary | ICD-10-CM

## 2022-07-07 DIAGNOSIS — J4540 Moderate persistent asthma, uncomplicated: Secondary | ICD-10-CM

## 2022-07-07 NOTE — Progress Notes (Signed)
Assessment & Plan:   #Shortness of breath #Mild Persistent Asthma   Presents for the evaluation of cough and wheezing with history suggestive and concerning for asthma. Imaging performed during her ED visit showed lung parenchyma to be normal. PFT's are within normal; methacholine challenge test was not ordered. She was empirically started on Symbicort given wheezing with good response. Physical exam today is within normal, and she reports feeling improved.   -continue Symbicort 2 puffs twice daily  #Anxiety  Patient is reporting anxiety, depression, and rumination. I will refer her to psychiatry and to a psychologist.  - Ambulatory referral to Psychology - Ambulatory referral to Psychiatry   Return in about 1 year (around 07/07/2023).  I spent 30 minutes caring for this patient today, including preparing to see the patient, obtaining a medical history , reviewing a separately obtained history, performing a medically appropriate examination and/or evaluation, counseling and educating the patient/family/caregiver, ordering medications, tests, or procedures, referring and communicating with other health care professionals (not separately reported), and documenting clinical information in the electronic health record  Raechel ChuteKhabib Saya Mccoll, MD  Pulmonary Critical Care 07/07/2022 10:15 PM    End of visit medications:  No orders of the defined types were placed in this encounter.    Current Outpatient Medications:    budesonide-formoterol (SYMBICORT) 160-4.5 MCG/ACT inhaler, Inhale 2 puffs into the lungs in the morning and at bedtime., Disp: 1 each, Rfl: 12   esomeprazole (NEXIUM) 40 MG capsule, Take 40 mg by mouth daily., Disp: , Rfl:    losartan (COZAAR) 50 MG tablet, Take 50 mg by mouth daily., Disp: , Rfl:    montelukast (SINGULAIR) 10 MG tablet, Take 10 mg by mouth every evening., Disp: , Rfl:    albuterol (VENTOLIN HFA) 108 (90 Base) MCG/ACT inhaler, Inhale into the lungs.  (Patient not taking: Reported on 07/07/2022), Disp: , Rfl:    cetirizine (ZYRTEC) 10 MG tablet, Take 1 tablet (10 mg total) by mouth daily. (Patient not taking: Reported on 06/02/2022), Disp: 30 tablet, Rfl: 0   chlorpheniramine-HYDROcodone (TUSSIONEX) 10-[name]/5ML, Take 5 mLs by mouth every 12 (twelve) hours as needed for cough. (Patient not taking: Reported on 06/02/2022), Disp: 115 mL, Rfl: 0   clonazePAM (KLONOPIN) 0.5 MG tablet, Take 0.5 mg by mouth daily as needed. (Patient not taking: Reported on 06/04/2022), Disp: , Rfl:    fluticasone (FLONASE) 50 MCG/ACT nasal spray, Place 1 spray into both nostrils 2 (two) times daily. (Patient not taking: Reported on 06/02/2022), Disp: 16 g, Rfl: 0   gabapentin (NEURONTIN) 300 MG capsule, Take 600 mg by mouth 2 (two) times daily. (Patient not taking: Reported on 06/02/2022), Disp: , Rfl:    hydrOXYzine (ATARAX) 10 MG tablet, Take 10 mg by mouth 3 (three) times daily. (Patient not taking: Reported on 06/02/2022), Disp: , Rfl:    levocetirizine (XYZAL) 5 MG tablet, Take 5 mg by mouth every evening. (Patient not taking: Reported on 06/04/2022), Disp: , Rfl:    meloxicam (MOBIC) 15 MG tablet, Take 1 tablet (15 mg total) by mouth daily. (Patient not taking: Reported on 06/02/2022), Disp: 30 tablet, Rfl: 0   methocarbamol (ROBAXIN) 500 MG tablet, Take 1 tablet (500 mg total) by mouth at bedtime as needed (neck pain). (Patient not taking: Reported on 06/02/2022), Disp: 30 tablet, Rfl: 1   pantoprazole (PROTONIX) 40 MG tablet, Take 1 tablet (40 mg total) by mouth daily. (Patient not taking: Reported on 10/18/2021), Disp: 30 tablet, Rfl: 3   sertraline (ZOLOFT)  50 MG tablet, Take by mouth. (Patient not taking: Reported on 06/02/2022), Disp: , Rfl:  No current facility-administered medications for this visit.  Facility-Administered Medications Ordered in Other Visits:    sodium chloride flush (NS) 0.9 % injection 10 mL, 10 mL, Intravenous, PRN, Earna Coder, MD, 10 mL at 12/28/19  0815   Subjective:   PATIENT ID: Kim Contreras GENDER: female DOB: [birth year], MRN: [id number]  Chief Complaint  Patient presents with   Follow-up    SOB with exertion. No wheezing. Cough with clear/white sputum.    HPI  The patient is a 62 year old spanish speaking female presenting to clinic for the evaluation of cough and shortness of breath. I first saw her on 06/02/2022.  Patient reports symptoms that started in late November and have persisted since. Her cough was initially persistent and productive of sputum. She has a wheeze that she has noticed. She also reports exertional dyspnea. This is the first time this has happened to her. Patient reports strong seasonal allergies since moving to the [place]. She has never similar symptoms of cough or shortness of breath in the past, however. She was given an albuterol inhaler without much help. She was seen in the ED for cough and discharged with a pulmonary referral. Patient is also seen by oncology for follicular lymphoma s/p chemotherapy.  Following our last visit, I ordered PFT's that she is presenting to discuss. I also started her on Symbicort. She reports that the Symbicort has helped a good amount with her symptoms. On a scale of 0-10, she feels her symptoms improved by a 6 on the scale. She continues to report some pains in her shoulder as well as her neck, especially when she coughs.  Patient further reports that she thinks she has severe anxiety, and around the time her symptoms started she was very anxious and was unable to leave her house. She is inquiring about whether she should be seen by a psychiatrist.   She is originally from [place], and has been in the [place] for 30 years. 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 She is a non-smoker, and denies any other exposures. She has a brother and a sister who have asthma.  Ancillary information including prior  medications, full medical/surgical/family/social histories, and PFTs (when available) are listed below and have been reviewed.   Review of Systems  Constitutional:  Negative for chills and fever.  Respiratory:  Positive for cough and shortness of breath. Negative for hemoptysis, sputum production and wheezing.   Cardiovascular:  Negative for chest pain.     Objective:   Vitals:   07/07/22 1552  BP: 138/84  Pulse: 72  Temp: 97.7 F (36.5 C)  SpO2: 99%  Weight: 143 lb (64.9 kg)  Height: 5' (1.524 m)   99% on RA BMI Readings from Last 3 Encounters:  07/07/22 27.93 kg/m  06/04/22 27.15 kg/m  06/02/22 27.42 kg/m   Wt Readings from Last 3 Encounters:  07/07/22 143 lb (64.9 kg)  06/04/22 139 lb (63 kg)  06/02/22 140 lb 6.4 oz (63.7 kg)    Physical Exam Constitutional:      General: She is not in acute distress.    Appearance: Normal appearance. She is not ill-appearing.  HENT:     Head: Normocephalic.     Nose: Nose normal. No congestion.  Cardiovascular:     Rate and Rhythm: Normal rate and regular  rhythm.  Pulmonary:     Effort: Pulmonary effort is normal.     Breath sounds: No wheezing, rhonchi or rales.  Abdominal:     Palpations: Abdomen is soft.  Neurological:     General: No focal deficit present.     Mental Status: She is alert and oriented to person, place, and time. Mental status is at baseline.       Ancillary Information    Past Medical History:  Diagnosis Date   Anemia 2006   Anxiety    Arthritis    Diverticulosis    Helicobacter pylori gastritis    History of hiatal hernia    Hypertension    Lymphoma      Family History  Problem Relation Age of Onset   Breast cancer Other 1   Prostate cancer Neg Hx    Bladder Cancer Neg Hx    Kidney cancer Neg Hx      Past Surgical History:  Procedure Laterality Date   APPENDECTOMY     CHOLECYSTECTOMY N/A 01/19/2015   Procedure: LAPAROSCOPIC CHOLECYSTECTOMY;  Surgeon: Nadeen Landau,  MD;  [name];  Service: General;  [name];   COLONOSCOPY WITH PROPOFOL N/A 10/27/2014   Procedure: COLONOSCOPY WITH PROPOFOL;  Surgeon: Christena Deem, MD;  Location[facility] ENDOSCOPY;  Service: Endoscopy;  [name];   COLONOSCOPY WITH PROPOFOL N/A 02/05/2021   Procedure: COLONOSCOPY WITH PROPOFOL;  Surgeon: Regis Bill, MD;  [name];  Service: Endoscopy;  [name];   ESOPHAGOGASTRODUODENOSCOPY     ESOPHAGOGASTRODUODENOSCOPY (EGD) WITH PROPOFOL N/A 02/05/2021   Procedure: ESOPHAGOGASTRODUODENOSCOPY (EGD) WITH PROPOFOL;  Surgeon: Regis Bill, MD;  [name];  Service: Endoscopy;  [name];   EXCISION MASS ABDOMINAL N/A 06/03/2019   Procedure: EXCISION MASS ABDOMINAL;  Surgeon: Earline Mayotte, MD;  [name];  Service: General;  [name];  abdominal node biopsy   NASAL SINUS SURGERY     PORT-A-CATH REMOVAL  09/2020   by Dr. Nelly Rout PLACEMENT Left 07/08/2019   Procedure: INSERTION PORT-A-CATH;  Surgeon: Earline Mayotte, MD;  [name];  Service: General;  Laterality: Left;   TOE AMPUTATION Left 11/28/2021   middle toe   TUBAL LIGATION      Social History   Socioeconomic History   Marital status: Married    Spouse name: Not on file   Number of children: Not on file   Years of education: Not on file   Highest education level: Not on file  Occupational History   Not on file  Tobacco Use   Smoking status: Never   Smokeless tobacco: Never  Vaping Use   Vaping Use: Never used  Substance and Sexual Activity   Alcohol use: No   Drug use: No   Sexual activity: Not on file  Other Topics Concern   Not on file  Social History Narrative   Not on file   Social Determinants of Health   Financial Resource Strain: Not on file  Food Insecurity: Not on file  Transportation Needs: Not on file  Physical Activity: Not on file  Stress: Not on file  Social  Connections: Not on file  Intimate Partner Violence: Not on file     Allergies  Allergen Reactions   Chlorhexidine     Not required   Pseudoephedrine Hcl Other (See Comments)     CBC    Component Value Date/Time   WBC 4.3 05/28/2022 0907   RBC  4.27 05/28/2022 0907   HGB 14.0 05/28/2022 0907   HCT 41.2 05/28/2022 0907   PLT 235 05/28/2022 0907   MCV 96.5 05/28/2022 0907   MCH 32.8 05/28/2022 0907   MCHC 34.0 05/28/2022 0907   RDW 13.1 05/28/2022 0907   LYMPHSABS 1.5 05/28/2022 0907   MONOABS 0.2 05/28/2022 0907   EOSABS 0.1 05/28/2022 0907   BASOSABS 0.0 05/28/2022 0907    Pulmonary Functions Testing Results:    Latest Ref Rng & Units 06/19/2022    4:40 PM  PFT Results  FVC-Pre L 2.66   FVC-Predicted Pre % 95   FVC-Post L 2.58   FVC-Predicted Post % 91   Pre FEV1/FVC % % 83   Post FEV1/FCV % % 90   FEV1-Pre L 2.21   FEV1-Predicted Pre % 102   FEV1-Post L 2.32   DLCO uncorrected ml/min/mmHg 21.57   DLCO UNC% % 123   DLVA Predicted % 129   TLC L 4.32   TLC % Predicted % 96   RV % Predicted % 89     Outpatient Medications Prior to Visit  Medication Sig Dispense Refill   budesonide-formoterol (SYMBICORT) 160-4.5 MCG/ACT inhaler Inhale 2 puffs into the lungs in the morning and at bedtime. 1 each 12   esomeprazole (NEXIUM) 40 MG capsule Take 40 mg by mouth daily.     losartan (COZAAR) 50 MG tablet Take 50 mg by mouth daily.     montelukast (SINGULAIR) 10 MG tablet Take 10 mg by mouth every evening.     albuterol (VENTOLIN HFA) 108 (90 Base) MCG/ACT inhaler Inhale into the lungs. (Patient not taking: Reported on 07/07/2022)     cetirizine (ZYRTEC) 10 MG tablet Take 1 tablet (10 mg total) by mouth daily. (Patient not taking: Reported on 06/02/2022) 30 tablet 0   chlorpheniramine-HYDROcodone (TUSSIONEX) 10-[name]/5ML Take 5 mLs by mouth every 12 (twelve) hours as needed for cough. (Patient not taking: Reported on 06/02/2022) 115 mL 0   clonazePAM (KLONOPIN) 0.5 MG tablet Take  0.5 mg by mouth daily as needed. (Patient not taking: Reported on 06/04/2022)     fluticasone (FLONASE) 50 MCG/ACT nasal spray Place 1 spray into both nostrils 2 (two) times daily. (Patient not taking: Reported on 06/02/2022) 16 g 0   gabapentin (NEURONTIN) 300 MG capsule Take 600 mg by mouth 2 (two) times daily. (Patient not taking: Reported on 06/02/2022)     hydrOXYzine (ATARAX) 10 MG tablet Take 10 mg by mouth 3 (three) times daily. (Patient not taking: Reported on 06/02/2022)     levocetirizine (XYZAL) 5 MG tablet Take 5 mg by mouth every evening. (Patient not taking: Reported on 06/04/2022)     meloxicam (MOBIC) 15 MG tablet Take 1 tablet (15 mg total) by mouth daily. (Patient not taking: Reported on 06/02/2022) 30 tablet 0   methocarbamol (ROBAXIN) 500 MG tablet Take 1 tablet (500 mg total) by mouth at bedtime as needed (neck pain). (Patient not taking: Reported on 06/02/2022) 30 tablet 1   pantoprazole (PROTONIX) 40 MG tablet Take 1 tablet (40 mg total) by mouth daily. (Patient not taking: Reported on 10/18/2021) 30 tablet 3   sertraline (ZOLOFT) 50 MG tablet Take by mouth. (Patient not taking: Reported on 06/02/2022)     Facility-Administered Medications Prior to Visit  Medication Dose Route Frequency Provider Last Rate Last Admin   sodium chloride flush (NS) 0.9 % injection 10 mL  10 mL Intravenous PRN Earna Coder, MD   10 mL at 12/28/19  0815   

## 2022-11-03 IMAGING — CT CT NECK W/ CM
4 of 5 series · 14 of 35 positions shown, 16 images · IV contrast (agent unspecified)
Comparison: PET CT May 08, 2020.

CLINICAL DATA: Hematologic malignancy, assess treatment response
neck swelling/lymphnodes

EXAM:
CT NECK WITH CONTRAST
TECHNIQUE: Multidetector CT imaging of the neck was performed using the
standard protocol following the bolus administration of intravenous
contrast.

[Series 14: bone windows neck 2.00 · axial · 0.64mm/px · z∈[-733,-617]mm · 3 of 117 slices shown, 4 images]
[im 30/117  soft-tissue]
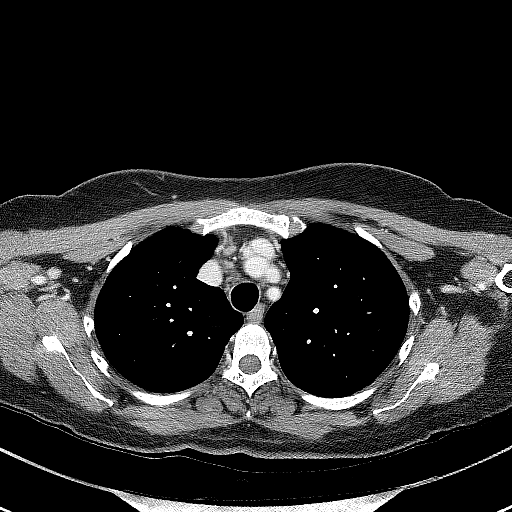
[im 30/117  bone]
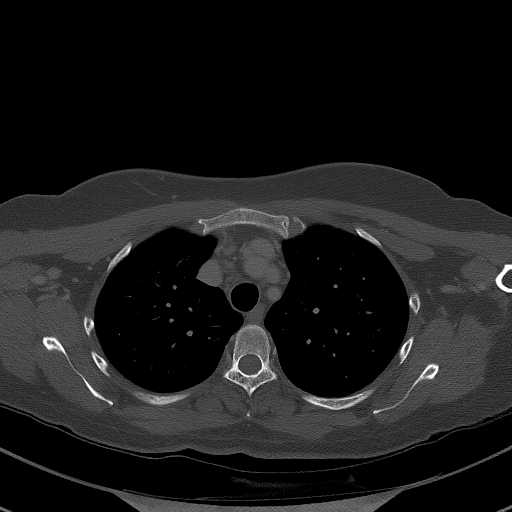
[im 59/117  bone]
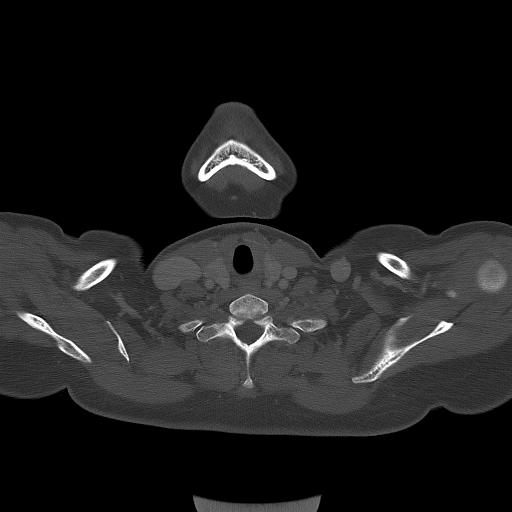
[im 88/117  bone]
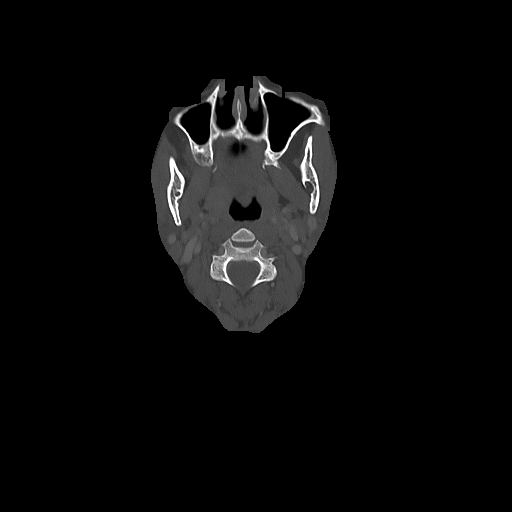

[Series 15: coronals neck 2.00 cor · coronal · 0.46mm/px · 3 of 144 slices shown]
[im 29/144  bone]
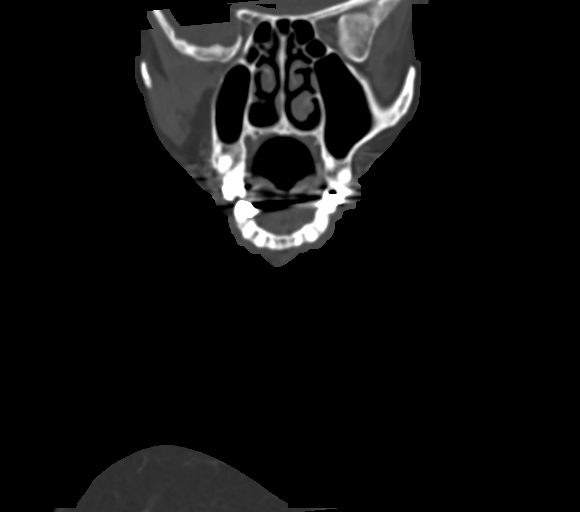
[im 58/144  bone]
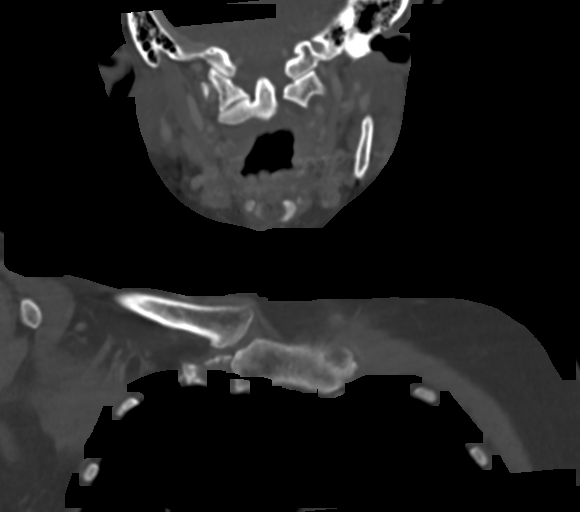
[im 86/144  bone]
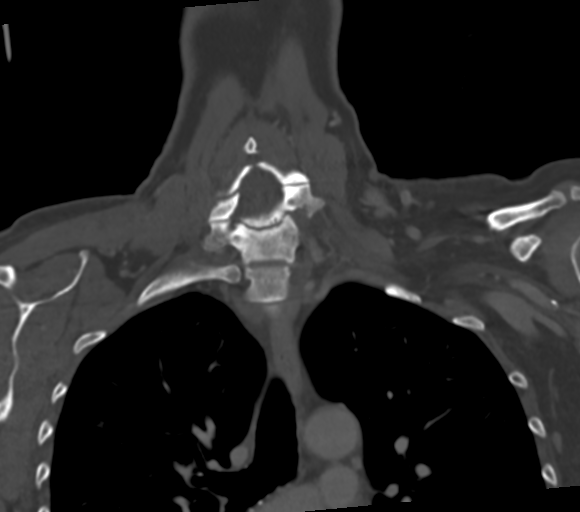

[Series 17: sagittals neck 2.00 sag · sagittal · 0.46mm/px · 5 of 133 slices shown, 6 images]
[im 45/133  bone]
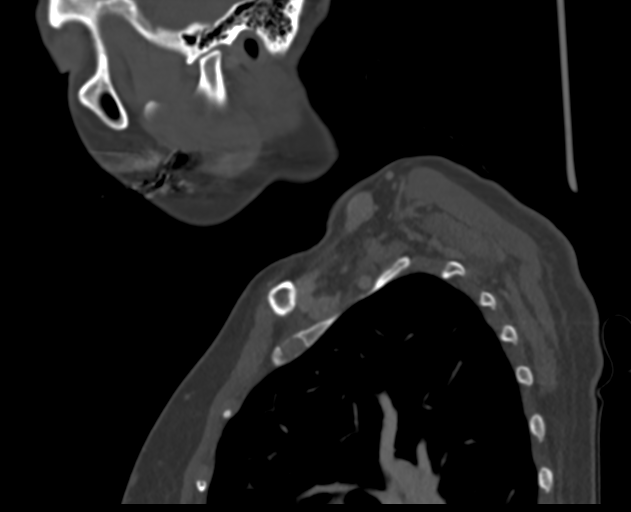
[im 56/133  bone]
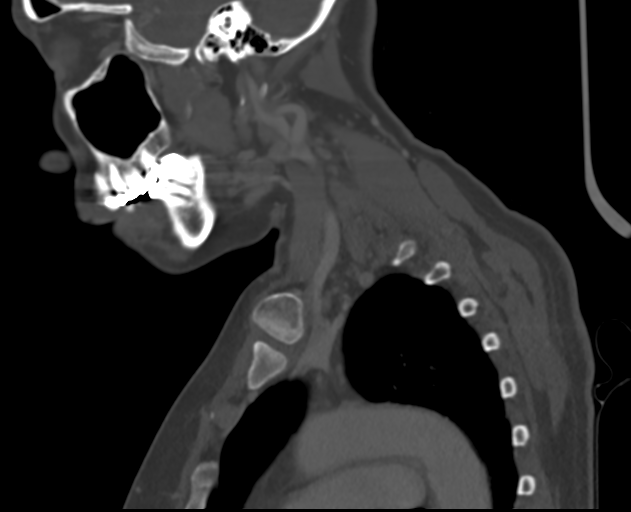
[im 67/133  soft-tissue]
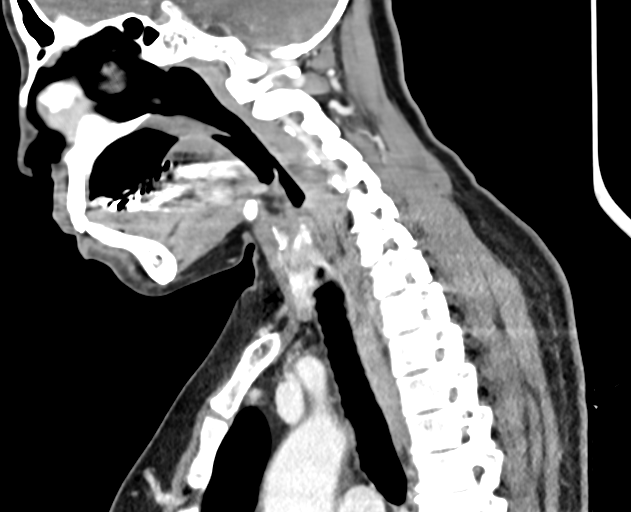
[im 67/133  bone]
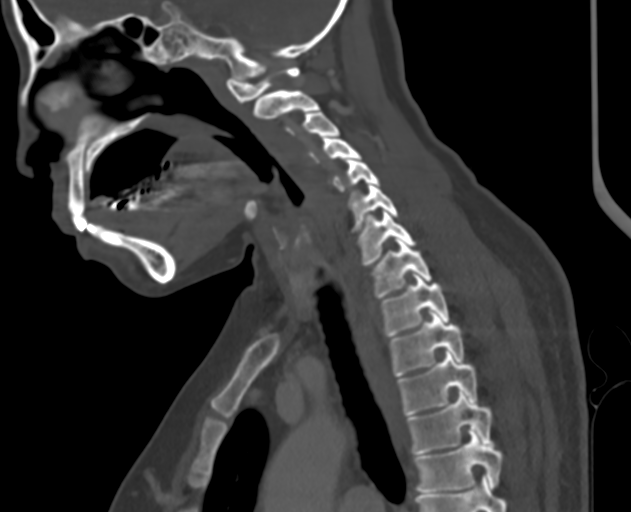
[im 78/133  bone]
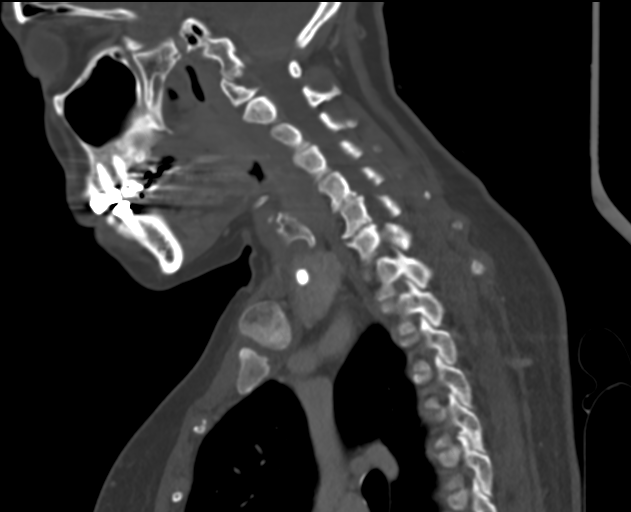
[im 89/133  bone]
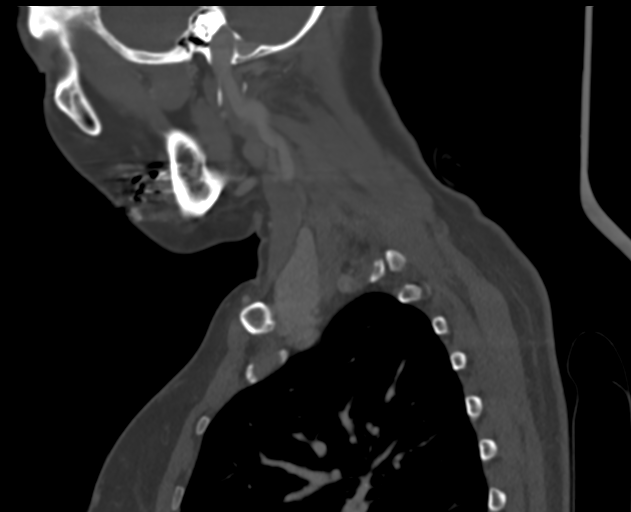

[Series 19: ax oropharynx neck 2.00 ax · axial · 0.52mm/px · z∈[-720,-604]mm · 3 of 117 slices shown]
[im 30/117  bone]
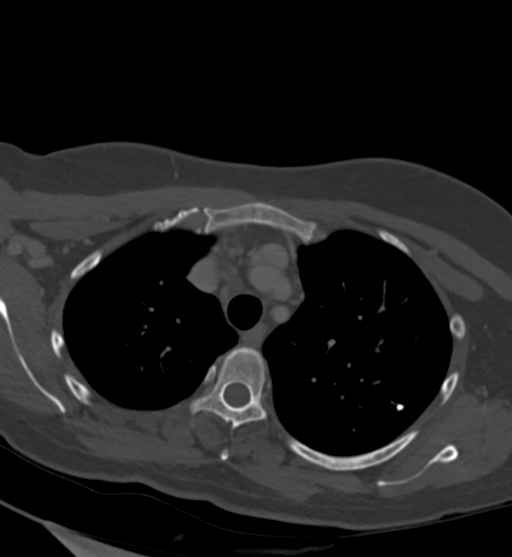
[im 59/117  bone]
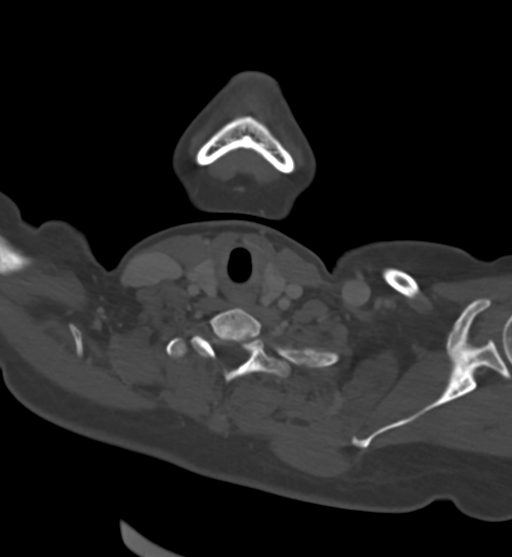
[im 88/117  bone]
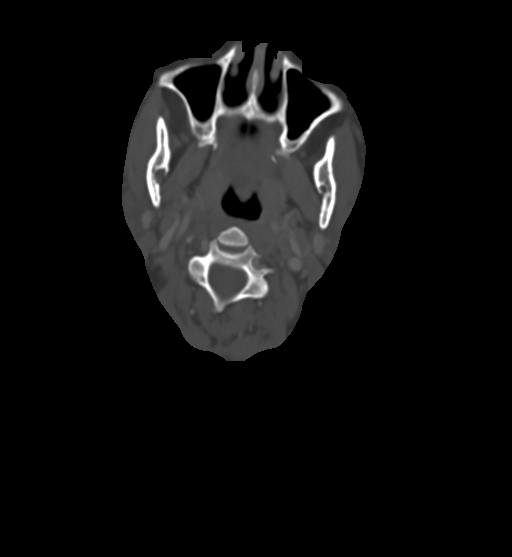

[14 of 35 positions shown; findings below may reference images not displayed]

RADIATION DOSE REDUCTION: This exam was performed according to the
departmental dose-optimization program which includes automated
exposure control, adjustment of the mA and/or kV according to
patient size and/or use of iterative reconstruction technique.

CONTRAST:  75mL OMNIPAQUE IOHEXOL 300 MG/ML  SOLN
FINDINGS: Pharynx and larynx: No visible mass or swelling. Small left palatine
tonsillith. Streak artifact from dental amalgam limits assessment.

Salivary glands: No inflammation, mass, or stone.

Thyroid: Subcentimeter calcified nodule in the right thyroid lobe,
which is not require further imaging follow-up (ref: [HOSPITAL]. [DATE]): 143-50).

Lymph nodes: None enlarged or abnormal density.

Vascular: Negative.

Limited intracranial: Negative.

Visualized orbits: Negative.

Mastoids and visualized paranasal sinuses: Clear.

Skeleton: No acute abnormality.

Upper chest: Calcified granuloma in the left upper lobe. Otherwise,
visualized lung apices are clear.
IMPRESSION: No lymphadenopathy in the neck.

## 2022-11-15 ENCOUNTER — Encounter: Payer: Self-pay | Admitting: Internal Medicine

## 2022-12-02 ENCOUNTER — Encounter: Payer: Self-pay | Admitting: Internal Medicine

## 2022-12-02 ENCOUNTER — Inpatient Hospital Stay: Payer: BLUE CROSS/BLUE SHIELD | Admitting: Internal Medicine

## 2022-12-02 ENCOUNTER — Inpatient Hospital Stay: Payer: BLUE CROSS/BLUE SHIELD | Attending: Internal Medicine

## 2022-12-02 VITALS — BP 138/82 | HR 78 | Temp 98.2°F | Resp 18 | Ht 60.0 in | Wt 144.0 lb

## 2022-12-02 DIAGNOSIS — J45909 Unspecified asthma, uncomplicated: Secondary | ICD-10-CM | POA: Diagnosis not present

## 2022-12-02 DIAGNOSIS — G8929 Other chronic pain: Secondary | ICD-10-CM | POA: Diagnosis not present

## 2022-12-02 DIAGNOSIS — R590 Localized enlarged lymph nodes: Secondary | ICD-10-CM | POA: Diagnosis not present

## 2022-12-02 DIAGNOSIS — C8238 Follicular lymphoma grade IIIa, lymph nodes of multiple sites: Secondary | ICD-10-CM

## 2022-12-02 DIAGNOSIS — Z803 Family history of malignant neoplasm of breast: Secondary | ICD-10-CM | POA: Diagnosis not present

## 2022-12-02 DIAGNOSIS — Z9221 Personal history of antineoplastic chemotherapy: Secondary | ICD-10-CM | POA: Diagnosis not present

## 2022-12-02 DIAGNOSIS — B0229 Other postherpetic nervous system involvement: Secondary | ICD-10-CM | POA: Insufficient documentation

## 2022-12-02 DIAGNOSIS — R42 Dizziness and giddiness: Secondary | ICD-10-CM | POA: Diagnosis not present

## 2022-12-02 DIAGNOSIS — D8481 Immunodeficiency due to conditions classified elsewhere: Secondary | ICD-10-CM | POA: Insufficient documentation

## 2022-12-02 DIAGNOSIS — R109 Unspecified abdominal pain: Secondary | ICD-10-CM | POA: Diagnosis not present

## 2022-12-02 DIAGNOSIS — M791 Myalgia, unspecified site: Secondary | ICD-10-CM | POA: Diagnosis not present

## 2022-12-02 DIAGNOSIS — R61 Generalized hyperhidrosis: Secondary | ICD-10-CM | POA: Insufficient documentation

## 2022-12-02 DIAGNOSIS — R059 Cough, unspecified: Secondary | ICD-10-CM | POA: Diagnosis not present

## 2022-12-02 DIAGNOSIS — Z79899 Other long term (current) drug therapy: Secondary | ICD-10-CM | POA: Diagnosis not present

## 2022-12-02 DIAGNOSIS — Z8572 Personal history of non-Hodgkin lymphomas: Secondary | ICD-10-CM | POA: Diagnosis present

## 2022-12-02 DIAGNOSIS — R519 Headache, unspecified: Secondary | ICD-10-CM | POA: Insufficient documentation

## 2022-12-02 LAB — VITAMIN D 25 HYDROXY (VIT D DEFICIENCY, FRACTURES): Vit D, 25-Hydroxy: 37.26 ng/mL (ref 30–100)

## 2022-12-02 LAB — CBC WITH DIFFERENTIAL (CANCER CENTER ONLY)
Abs Immature Granulocytes: 0.01 10*3/uL (ref 0.00–0.07)
Basophils Absolute: 0 10*3/uL (ref 0.0–0.1)
Basophils Relative: 1 %
Eosinophils Absolute: 0 10*3/uL (ref 0.0–0.5)
Eosinophils Relative: 1 %
HCT: 38.8 % (ref 36.0–46.0)
Hemoglobin: 13.2 g/dL (ref 12.0–15.0)
Immature Granulocytes: 0 %
Lymphocytes Relative: 50 %
Lymphs Abs: 2.2 10*3/uL (ref 0.7–4.0)
MCH: 32.5 pg (ref 26.0–34.0)
MCHC: 34 g/dL (ref 30.0–36.0)
MCV: 95.6 fL (ref 80.0–100.0)
Monocytes Absolute: 0.4 10*3/uL (ref 0.1–1.0)
Monocytes Relative: 9 %
Neutro Abs: 1.7 10*3/uL (ref 1.7–7.7)
Neutrophils Relative %: 39 %
Platelet Count: 233 10*3/uL (ref 150–400)
RBC: 4.06 MIL/uL (ref 3.87–5.11)
RDW: 12.9 % (ref 11.5–15.5)
WBC Count: 4.4 10*3/uL (ref 4.0–10.5)
nRBC: 0 % (ref 0.0–0.2)

## 2022-12-02 LAB — CMP (CANCER CENTER ONLY)
ALT: 14 U/L (ref 0–44)
AST: 17 U/L (ref 15–41)
Albumin: 4.4 g/dL (ref 3.5–5.0)
Alkaline Phosphatase: 81 U/L (ref 38–126)
Anion gap: 7 (ref 5–15)
BUN: 16 mg/dL (ref 8–23)
CO2: 25 mmol/L (ref 22–32)
Calcium: 9 mg/dL (ref 8.9–10.3)
Chloride: 105 mmol/L (ref 98–111)
Creatinine: 1.05 mg/dL — ABNORMAL HIGH (ref 0.44–1.00)
GFR, Estimated: >60 mL/min (ref >60)
Glucose, Bld: 101 mg/dL — ABNORMAL HIGH (ref 70–99)
Potassium: 3.9 mmol/L (ref 3.5–5.1)
Sodium: 137 mmol/L (ref 135–145)
Total Bilirubin: 0.5 mg/dL (ref 0.3–1.2)
Total Protein: 8 g/dL (ref 6.5–8.1)

## 2022-12-02 LAB — LACTATE DEHYDROGENASE: LDH: 118 U/L (ref 98–192)

## 2022-12-02 NOTE — Assessment & Plan Note (Addendum)
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 S/p Obi-benda x6 cylces- FEB 2024 CT SCAN- No findings of adenopathy or active lymphoma. Stable stranding in the central mesentery compatible with treated lymphoma.   # will repeat CT scan in G+FEB 2025. Labs-CBC/chemistries were reviewed with the patient.  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  Currently status close patient vaccination pneumonia 2023 and also COVID/flu.   IgG 2023- WNL.  recommend vit C 500 mg; and recommend vit D 1000/units. awaiting immunoglobulins at next visit.  # reflux like symptoms- on Nexium [Dr.[place]- [facility]]; defer to GI.   # Myalgias- ? Awaiting Vit D levels.   # Right flank shingles/postherpetic neuralgia.  Off gabapentin/cymbalta-  stable.   # Asthma- [Dr.Dgyali] cough- inhalers- continue Nexium [reflux on CT scan]-stable.  # Chronic pain right quadrant abdmonal pain/chornic- EGD/[facility] [NOV 2022- ] in nov 2022 [2016-[facility]]- stable.   # IV access: Mediport explanted; PIV  # DISPOSITION:  # Follow up in 6 months-- ;MD; labs- cbc/cmp;LDH;- CT CAP - Dr.B

## 2022-12-02 NOTE — Progress Notes (Signed)
Pain legs arms joints and body aches.Rates over all pain a 4 /10 today. Eating good. Does report Night sweats. No fevers. Dizzy at times. Headaches from back and neck pain radiates up into head. Not sleeping too well. Glands swollen in neck and tender. Both legs feel swollen from the hips down. Constipation relieved by Colace and prunes.

## 2022-12-02 NOTE — Progress Notes (Signed)
[facility] CONSULT NOTE  Patient Care Team: Center, [facility] as PCP - General (General Practice) Earna Coder, MD as Consulting Physician (Hematology and Oncology) Lemar Livings Merrily Pew, MD as Consulting Physician (General Surgery)  CHIEF COMPLAINTS/PURPOSE OF CONSULTATION: Lymphoma   Oncology History Overview Note  # 5th FEB 2021- ABDOMINAL MASS-  9.3 x 3.0 cm central mesenteric soft tissue mass is identified on image 38/series 2. 3.0x 3.0 cm collar of soft tissue surrounds branches of the superior mesenteric artery and vein on image 47/2 with relatively little mass-effect on the vascular anatomy. 2.0 x 1.5 cm nodular component of this abnormal soft tissue is identified in the mesentery of the right pelvis on 55/2. FEB 2021- PET-  PET scan-February 2021-SUV around 5.5 again highly suggestive of malignancy lymphoma.  Small cluster left supra pelvic lymph node/small retroperitoneal lymph nodes-5 mm-Douville score 3.   # MARCH 2021- grade 3A follicular lymphoma [Open biopsy Dr. Burnett-] STAGE II vs III (equivocal neck lymph node) [no bone marrow bx - #Status post-Gazyva-Bendamustine x6 cycles [finished August 2021]- PET scan February, 2022-treated disease noted to have any progression.  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  Kim Contreras 62 y.o.  female with follicular lymphoma grade 3 status post Gazyva-Benda [Last chemotherapy in February 2022. ]-with intermittent neutropenia currently on surveillance is here for follow-up.  No further  episodes of pneumonia. She continues to complain of chronic fatigue. Complains of reflux like symptoms. No nausea or vomiting.   Patient complain legs arms joints and body aches.Rates over all pain a 4 /10 today. Eating good.   Does report Night sweats. No fevers. Dizzy at times. Headaches from back and neck pain radiates up into head. Not sleeping too well.   Glands swollen in neck and tender. Both legs feel swollen from the hips down.   Constipation relieved by Colace and prune.   Review of Systems  Constitutional:  Positive for malaise/fatigue. Negative for chills, diaphoresis, [name], hemoptysis, sputum production, [name], palpitations, [name], diarrhea, heartburn, melena and vomiting.  Musculoskeletal:  Positive for back pain and joint pain.  Skin: Negative.  Negative for itching and rash.  Neurological:  Positive for tingling. Negative for dizziness, focal weakness and weakness.  Endo/Heme/Allergies:  Does not bruise/bleed easily.  Psychiatric/Behavioral:  Negative for depression. The patient has insomnia. The patient is not nervous/anxious.      MEDICAL HISTORY:  Past Medical History:  Diagnosis Date   Anemia 2006   Anxiety    Arthritis    Diverticulosis    Helicobacter pylori gastritis    History of hiatal hernia    Hypertension    Lymphoma (HCC)     SURGICAL HISTORY: Past Surgical History:  Procedure Laterality Date   APPENDECTOMY     CHOLECYSTECTOMY N/A 01/19/2015  Procedure: LAPAROSCOPIC CHOLECYSTECTOMY;  Surgeon: Nadeen Landau, MD;  [name];  Service: General;  [name];   COLONOSCOPY WITH PROPOFOL N/A 10/27/2014   Procedure: COLONOSCOPY  WITH PROPOFOL;  Surgeon: Christena Deem, MD;  Location: [facility] ENDOSCOPY;  Service: Endoscopy;  [name];   COLONOSCOPY WITH PROPOFOL N/A 02/05/2021   Procedure: COLONOSCOPY WITH PROPOFOL;  Surgeon: Regis Bill, MD;  [name];  Service: Endoscopy;  [name];   ESOPHAGOGASTRODUODENOSCOPY     ESOPHAGOGASTRODUODENOSCOPY (EGD) WITH PROPOFOL N/A 02/05/2021   Procedure: ESOPHAGOGASTRODUODENOSCOPY (EGD) WITH PROPOFOL;  Surgeon: Regis Bill, MD;  [name];  Service: Endoscopy;  [name];   EXCISION MASS ABDOMINAL N/A 06/03/2019   Procedure: EXCISION MASS ABDOMINAL;  Surgeon: Earline Mayotte, MD;  [name];  Service: General;  [name];  abdominal node biopsy   NASAL SINUS SURGERY     PORT-A-CATH REMOVAL  09/2020   by Dr. Nelly Rout PLACEMENT Left 07/08/2019   Procedure: INSERTION PORT-A-CATH;  Surgeon: Earline Mayotte, MD;  [name];  Service: General;  Laterality: Left;   TOE AMPUTATION Left 11/28/2021   middle toe   TUBAL LIGATION      SOCIAL HISTORY: Social History   Socioeconomic History   Marital status: Married    Spouse name: Not on file   Number of children: Not on file   Years of education: Not on file   Highest education level: Not on file  Occupational History   Not on file  Tobacco Use   Smoking status: Never   Smokeless tobacco: Never  Vaping Use   Vaping status: Never Used  Substance and Sexual Activity   Alcohol use: No   Drug use: No   Sexual activity: Not on file  Other Topics Concern   Not on file  Social History Narrative   Not on file   Social Determinants of Health   Financial Resource Strain: Not on file  Food Insecurity: Not on file  Transportation Needs: Not on file  Physical Activity: Not on file  Stress: Not on file  Social Connections: Not on file  Intimate Partner Violence: Not on file    FAMILY HISTORY: Family History  Problem  Relation Age of Onset   Breast cancer Other 42   Prostate cancer Neg Hx    Bladder Cancer Neg Hx    Kidney cancer Neg Hx     ALLERGIES:  is allergic to chlorhexidine and pseudoephedrine hcl.  MEDICATIONS:  Current Outpatient Medications  Medication Sig Dispense Refill   albuterol (VENTOLIN HFA) 108 (90 Base) MCG/ACT inhaler Inhale into the lungs.     budesonide-formoterol (SYMBICORT) 160-4.5 MCG/ACT inhaler Inhale 2 puffs into the lungs in the morning and at bedtime. 1 each 12   cetirizine (ZYRTEC) 10 MG tablet Take 1 tablet (10 mg total) by mouth daily. 30 tablet 0   dicyclomine (BENTYL) 20 MG tablet Take 20 mg by mouth 4 (four) times daily as needed.     Docusate Sodium (DSS) 100 MG CAPS Take 1 capsule by mouth 2 (two) times daily.     esomeprazole (NEXIUM) 40 MG capsule Take 40 mg by mouth daily.     famotidine (PEPCID) 40 MG tablet Take 1 tablet by mouth at bedtime.     fluticasone (FLONASE) 50 MCG/ACT nasal spray Place 1 spray into both nostrils 2 (two) times daily. 16 g 0   levocetirizine (XYZAL) 5 MG tablet Take 5 mg by mouth  every evening.     losartan (COZAAR) 50 MG tablet Take 50 mg by mouth daily.     tiZANidine (ZANAFLEX) 4 MG tablet Take 4 mg by mouth at bedtime as needed.     chlorpheniramine-HYDROcodone (TUSSIONEX) 10-[name]/5ML Take 5 mLs by mouth every 12 (twelve) hours as needed for cough. (Patient not taking: Reported on 06/02/2022) 115 mL 0   montelukast (SINGULAIR) 10 MG tablet Take 10 mg by mouth every evening.     pantoprazole (PROTONIX) 40 MG tablet Take 1 tablet (40 mg total) by mouth daily. (Patient not taking: Reported on 10/18/2021) 30 tablet 3   No current facility-administered medications for this visit.   Facility-Administered Medications Ordered in Other Visits  Medication Dose Route Frequency Provider Last Rate Last Admin   sodium chloride flush (NS) 0.9 % injection 10 mL  10 mL Intravenous PRN Louretta Shorten R, MD   10 mL at 12/28/19 0815        PHYSICAL EXAMINATION: ECOG PERFORMANCE STATUS: 0 - Asymptomatic  Vitals:   12/02/22 1440  BP: 138/82  Pulse: 78  Resp: 18  Temp: 98.2 F (36.8 C)  SpO2: 99%   Filed Weights   12/02/22 1440  Weight: 144 lb (65.3 kg)   Patient in wheelchair because of left foot trauma.  Physical Exam HENT:     Head: Normocephalic and atraumatic.     Mouth/Throat:     Pharynx: No oropharyngeal exudate.  Eyes:     Pupils: Pupils are equal, round, and reactive to light.  Cardiovascular:     Rate and Rhythm: Normal rate and regular rhythm.  Pulmonary:     Effort: Pulmonary effort is normal. No respiratory distress.     Breath sounds: Normal breath sounds. No wheezing.  Abdominal:     General: Bowel sounds are normal. There is no distension.     Palpations: Abdomen is soft. There is no mass.     Tenderness: There is no abdominal tenderness. There is no guarding or rebound.  Musculoskeletal:        General: No tenderness. Normal range of motion.     Cervical back: Normal range of motion and neck supple.     Comments: Mild swelling of the left lower extremity; mild tenderness noted.  Skin:    General: Skin is warm.  Neurological:     Mental Status: She is alert and oriented to person, place, and time.  Psychiatric:        Mood and Affect: Affect normal.      LABORATORY DATA:  I have reviewed the data as listed Lab Results  Component Value Date   WBC 4.4 12/02/2022   HGB 13.2 12/02/2022   HCT 38.8 12/02/2022   MCV 95.6 12/02/2022   PLT 233 12/02/2022   Recent Labs    05/12/22 0258 05/28/22 0907 12/02/22 1417  NA 140 138 137  K 4.1 4.1 3.9  CL 107 103 105  CO2 24 27 25   GLUCOSE 102* 101* 101*  BUN 19 24* 16  CREATININE 0.94 0.94 1.05*  CALCIUM 9.1 9.3 9.0  GFRNONAA >60 >60 >60  PROT 7.5 8.7* 8.0  ALBUMIN 4.1 4.6 4.4  AST 18 16 17   ALT 12 12 14   ALKPHOS 72 82 81  BILITOT 0.6 0.6 0.5    RADIOGRAPHIC STUDIES: I have personally reviewed the radiological images as  listed and agreed with the findings in the report. No results found.   ASSESSMENT & PLAN:   Grade 3a follicular  lymphoma of lymph nodes of multiple regions [facility]) #Follicular lymphoma grade 3; stage II [versus stage III-eqivocal neck lymph nodes]. S/p Obi-benda x6 cylces- FEB 2024 CT SCAN- No findings of adenopathy or active lymphoma. Stable stranding in the central mesentery compatible with treated lymphoma.   # will repeat CT scan in G+FEB 2025. Labs-CBC/chemistries were reviewed with the patient.  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  Currently status close patient vaccination pneumonia 2023 and also COVID/flu.   IgG 2023- WNL.  recommend vit C 500 mg; and recommend vit D 1000/units. awaiting immunoglobulins at next visit.  # reflux like symptoms- on Nexium [Dr.Toledo- [facility]]; defer to GI.   # Myalgias- ? Awaiting Vit D levels.   # Right flank shingles/postherpetic neuralgia.  Off gabapentin/cymbalta-  stable.   # Asthma- [Dr.Dgyali] cough- inhalers- continue Nexium [reflux on CT scan]-stable.  # Chronic pain right quadrant abdmonal pain/chornic- EGD/Colo [NOV 2022- ] in nov 2022 [2016-colo]- stable.   # IV access: Mediport explanted; PIV  # DISPOSITION:  # Follow up in 6 months-- ;MD; labs- cbc/cmp;LDH;- CT CAP - Dr.B     All questions were answered. The patient knows to call the clinic with any problems, questions or concerns.    Earna Coder, MD 12/02/2022 3:47 PM

## 2022-12-05 LAB — IMMUNOGLOBULINS A/E/G/M, SERUM
IgA: 238 mg/dL (ref 87–352)
IgE (Immunoglobulin E), Serum: 177 [IU]/mL (ref 6–495)
IgG (Immunoglobin G), Serum: 1743 mg/dL — ABNORMAL HIGH (ref 586–1602)
IgM (Immunoglobulin M), Srm: 101 mg/dL (ref 26–217)

## 2023-01-05 ENCOUNTER — Ambulatory Visit: Payer: BLUE CROSS/BLUE SHIELD | Admitting: Psychiatry

## 2023-01-22 ENCOUNTER — Ambulatory Visit: Payer: Self-pay | Admitting: Psychiatry

## 2023-06-01 ENCOUNTER — Ambulatory Visit
Admission: RE | Admit: 2023-06-01 | Discharge: 2023-06-01 | Disposition: A | Discharge status: Home / Self Care | Payer: BLUE CROSS/BLUE SHIELD | Source: Ambulatory Visit | Attending: Internal Medicine | Admitting: Internal Medicine

## 2023-06-01 DIAGNOSIS — C8238 Follicular lymphoma grade IIIa, lymph nodes of multiple sites: Secondary | ICD-10-CM | POA: Insufficient documentation

## 2023-06-01 MED ORDER — IOHEXOL 300 MG/ML  SOLN
100.0000 mL | Freq: Once | INTRAMUSCULAR | Status: AC | PRN
  Administered 2023-06-01: 100 mL via INTRAVENOUS

## 2023-06-08 ENCOUNTER — Inpatient Hospital Stay: Payer: BLUE CROSS/BLUE SHIELD | Admitting: Internal Medicine

## 2023-06-08 ENCOUNTER — Telehealth: Payer: Self-pay | Admitting: Student in an Organized Health Care Education/Training Program

## 2023-06-08 ENCOUNTER — Inpatient Hospital Stay: Payer: BLUE CROSS/BLUE SHIELD | Attending: Internal Medicine

## 2023-06-08 ENCOUNTER — Encounter: Payer: Self-pay | Admitting: Internal Medicine

## 2023-06-08 VITALS — BP 148/78 | HR 70 | Temp 97.8°F | Resp 18 | Wt 146.0 lb

## 2023-06-08 DIAGNOSIS — Z803 Family history of malignant neoplasm of breast: Secondary | ICD-10-CM | POA: Insufficient documentation

## 2023-06-08 DIAGNOSIS — M791 Myalgia, unspecified site: Secondary | ICD-10-CM | POA: Insufficient documentation

## 2023-06-08 DIAGNOSIS — C8238 Follicular lymphoma grade IIIa, lymph nodes of multiple sites: Secondary | ICD-10-CM

## 2023-06-08 DIAGNOSIS — B0229 Other postherpetic nervous system involvement: Secondary | ICD-10-CM | POA: Insufficient documentation

## 2023-06-08 DIAGNOSIS — Z8572 Personal history of non-Hodgkin lymphomas: Secondary | ICD-10-CM | POA: Insufficient documentation

## 2023-06-08 DIAGNOSIS — D8481 Immunodeficiency due to conditions classified elsewhere: Secondary | ICD-10-CM | POA: Diagnosis not present

## 2023-06-08 DIAGNOSIS — J45909 Unspecified asthma, uncomplicated: Secondary | ICD-10-CM | POA: Insufficient documentation

## 2023-06-08 DIAGNOSIS — R519 Headache, unspecified: Secondary | ICD-10-CM

## 2023-06-08 DIAGNOSIS — G8929 Other chronic pain: Secondary | ICD-10-CM | POA: Diagnosis not present

## 2023-06-08 LAB — CBC WITH DIFFERENTIAL (CANCER CENTER ONLY)
Abs Immature Granulocytes: 0 10*3/uL (ref 0.00–0.07)
Basophils Absolute: 0 10*3/uL (ref 0.0–0.1)
Basophils Relative: 1 %
Eosinophils Absolute: 0 10*3/uL (ref 0.0–0.5)
Eosinophils Relative: 1 %
HCT: 37.1 % (ref 36.0–46.0)
Hemoglobin: 12.6 g/dL (ref 12.0–15.0)
Immature Granulocytes: 0 %
Lymphocytes Relative: 49 %
Lymphs Abs: 2 10*3/uL (ref 0.7–4.0)
MCH: 32.8 pg (ref 26.0–34.0)
MCHC: 34 g/dL (ref 30.0–36.0)
MCV: 96.6 fL (ref 80.0–100.0)
Monocytes Absolute: 0.4 10*3/uL (ref 0.1–1.0)
Monocytes Relative: 9 %
Neutro Abs: 1.6 10*3/uL — ABNORMAL LOW (ref 1.7–7.7)
Neutrophils Relative %: 40 %
Platelet Count: 237 10*3/uL (ref 150–400)
RBC: 3.84 MIL/uL — ABNORMAL LOW (ref 3.87–5.11)
RDW: 13 % (ref 11.5–15.5)
WBC Count: 4.1 10*3/uL (ref 4.0–10.5)
nRBC: 0 % (ref 0.0–0.2)

## 2023-06-08 LAB — CMP (CANCER CENTER ONLY)
ALT: 18 U/L (ref 0–44)
AST: 21 U/L (ref 15–41)
Albumin: 4.1 g/dL (ref 3.5–5.0)
Alkaline Phosphatase: 65 U/L (ref 38–126)
Anion gap: 9 (ref 5–15)
BUN: 17 mg/dL (ref 8–23)
CO2: 25 mmol/L (ref 22–32)
Calcium: 8.9 mg/dL (ref 8.9–10.3)
Chloride: 101 mmol/L (ref 98–111)
Creatinine: 0.78 mg/dL (ref 0.44–1.00)
GFR, Estimated: >60 mL/min (ref >60)
Glucose, Bld: 91 mg/dL (ref 70–99)
Potassium: 3.6 mmol/L (ref 3.5–5.1)
Sodium: 135 mmol/L (ref 135–145)
Total Bilirubin: 0.6 mg/dL (ref 0.0–1.2)
Total Protein: 7.5 g/dL (ref 6.5–8.1)

## 2023-06-08 LAB — LACTATE DEHYDROGENASE: LDH: 122 U/L (ref 98–192)

## 2023-06-08 NOTE — Telephone Encounter (Signed)
 Pt is asking if ok to transfer care to Dr. Jayme Cloud? Due to personal preferences.

## 2023-06-08 NOTE — Progress Notes (Signed)
 She had a CT scan on 06/01/2023.  Patient is taking macrobid precribed from her pcp for a UTI. She is having some pain and discomfort in lower back which radiates around her abdomen and shoots down her right leg and pain in her  Headaches, and she still has some tenderness from where her port used to be. She is having bone pain, and her hands and ankles are a little swollen.

## 2023-06-08 NOTE — Assessment & Plan Note (Addendum)
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 S/p Obi-benda x6 cylces- FEB 2025 CT SCAN- No findings of adenopathy or active lymphoma. Stable stranding in the central mesentery compatible with treated lymphoma.  No evidence of recurrence.  Labs-CBC/chemistries were reviewed with the patient.  Consider imaging on the annual basis.  Will order imaging at next visit.  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  Currently status close patient vaccination pneumonia 2023 and also COVID/flu.   IgG 2023- WNL.  recommend vit C 500 mg; and recommend vit D 1000/units.   # reflux like symptoms- on Nexium [Dr.Toledo- [facility]]; defer to GI.   # Myalgias- ? Awaiting Vit D levels.   # Right flank shingles/postherpetic neuralgia- .  Off gabapentin/cymbalta-because of poor tolerance.  Recommend follow-up with neurology especially given chronic headaches  # Asthma- [Dr.Dgyali] cough- inhalers- continue Nexium [reflux on CT scan]-stable.  # Chronic pain right quadrant abdmonal pain/chornic- EGD/Colo [NOV 2022- ] in nov 2022 [2016-colo]- stable.   # IV access: Mediport explanted; PIV  # DISPOSITION:  # refer to [facility]- neurology re: headaches/ and post- herpetic neuropathy-  # Follow up in 6 months-- ;MD; labs- cbc/cmp;LDH; quantitative immunoglobulin- Dr.B   # I reviewed the blood work- with the patient in detail; also reviewed the imaging independently [as summarized above]; and with the patient in detail.

## 2023-06-08 NOTE — Telephone Encounter (Signed)
 Ok, if ok with Dr. Algis Downs.

## 2023-06-08 NOTE — Progress Notes (Signed)
 [name] CONSULT NOTE  Patient Care Team: Center, [facility] as PCP - General (General Practice) Earna Coder, MD as Consulting Physician (Hematology and Oncology) Lemar Livings Merrily Pew, MD as Consulting Physician (General Surgery)  CHIEF COMPLAINTS/PURPOSE OF CONSULTATION: Lymphoma   Oncology History Overview Note  # 5th FEB 2021- ABDOMINAL MASS-  9.3 x 3.0 cm central mesenteric soft tissue mass is identified on image 38/series 2. 3.0x 3.0 cm collar of soft tissue surrounds branches of the superior mesenteric artery and vein on image 47/2 with relatively little mass-effect on the vascular anatomy. 2.0 x 1.5 cm nodular component of this abnormal soft tissue is identified in the mesentery of the right pelvis on 55/2. FEB 2021- PET-  PET scan-February 2021-SUV around 5.5 again highly suggestive of malignancy lymphoma.  Small cluster left supra pelvic lymph node/small retroperitoneal lymph nodes-5 mm-Douville score 3.   # MARCH 2021- grade 3A follicular lymphoma [Open biopsy Dr. Burnett-] STAGE II vs III (equivocal neck lymph node) [no bone marrow bx - #Status post-Gazyva-Bendamustine x6 cycles [finished August 2021]- PET scan February, 2022-treated disease noted to have any progression.  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  Kim Contreras 63 y.o.  female with follicular lymphoma grade 3 status post Gazyva-Benda [Last chemotherapy in February 2022. ]-with intermittent neutropenia currently on surveillance is here for follow-up/and review the CT  scan.  Patient complains of ongoing pain in the right lower back and also right abdomen.  This is in the area of her previous shingles rash.  Also complains of chronic headaches.  Patient is currently undergoing treatment for UTI with Macrobid. Constipation relieved by Colace and prune.   Review of Systems  Constitutional:  Positive for malaise/fatigue. Negative for chills, diaphoresis, [name], hemoptysis, sputum production, [name], palpitations, [name], diarrhea, heartburn, melena and vomiting.  Musculoskeletal:  Positive for back pain and joint pain.  Skin: Negative.  Negative for itching and rash.  Neurological:  Positive for tingling. Negative for dizziness, focal weakness and weakness.  Endo/Heme/Allergies:  Does not bruise/bleed easily.  Psychiatric/Behavioral:  Negative for depression. The patient has insomnia. The patient is not nervous/anxious.      MEDICAL HISTORY:  Past Medical History:  Diagnosis Date   Anemia 2006   Anxiety    Arthritis    Diverticulosis    Helicobacter pylori gastritis    History of hiatal hernia    Hypertension    Lymphoma (HCC)     SURGICAL HISTORY: Past Surgical History:  Procedure Laterality Date   APPENDECTOMY     CHOLECYSTECTOMY N/A 01/19/2015   Procedure: LAPAROSCOPIC CHOLECYSTECTOMY;  Surgeon: Nadeen Landau, MD;  [name];  Service: General;  [name];   COLONOSCOPY WITH PROPOFOL N/A 10/27/2014   Procedure: COLONOSCOPY WITH PROPOFOL;  Surgeon: Christena Deem,  MD;  [name];  Service: Endoscopy;  [name];   COLONOSCOPY WITH PROPOFOL N/A 02/05/2021   Procedure: COLONOSCOPY WITH PROPOFOL;   Surgeon: Regis Bill, MD;  [name];  Service: Endoscopy;  [name];   ESOPHAGOGASTRODUODENOSCOPY     ESOPHAGOGASTRODUODENOSCOPY (EGD) WITH PROPOFOL N/A 02/05/2021   Procedure: ESOPHAGOGASTRODUODENOSCOPY (EGD) WITH PROPOFOL;  Surgeon: Regis Bill, MD;  [name];  Service: Endoscopy;  [name];   EXCISION MASS ABDOMINAL N/A 06/03/2019   Procedure: EXCISION MASS ABDOMINAL;  Surgeon: Earline Mayotte, MD;  [name];  Service: General;  [name];  abdominal node biopsy   NASAL SINUS SURGERY     PORT-A-CATH REMOVAL  09/2020   by Dr. Nelly Rout PLACEMENT Left 07/08/2019   Procedure: INSERTION PORT-A-CATH;  Surgeon: Earline Mayotte, MD;  [name];  Service: General;  Laterality: Left;   TOE AMPUTATION Left 11/28/2021   middle toe   TUBAL LIGATION      SOCIAL HISTORY: Social History   Socioeconomic History   Marital status: Married    Spouse name: Not on file   Number of children: Not on file   Years of education: Not on file   Highest education level: Not on file  Occupational History   Not on file  Tobacco Use   Smoking status: Never   Smokeless tobacco: Never  Vaping Use   Vaping status: Never Used  Substance and Sexual Activity   Alcohol use: No   Drug use: No   Sexual activity: Not on file  Other Topics Concern   Not on file  Social History Narrative   Not on file   Social Drivers of Health   Financial Resource Strain: Not on file  Food Insecurity: Not on file  Transportation Needs: Not on file  Physical Activity: Not on file  Stress: Not on file  Social Connections: Not on file  Intimate Partner Violence: Not on file    FAMILY HISTORY: Family History  Problem Relation Age of Onset   Breast cancer Other 56   Prostate cancer Neg Hx    Bladder Cancer Neg Hx    Kidney cancer Neg Hx     ALLERGIES:  is allergic to chlorhexidine and pseudoephedrine  hcl.  MEDICATIONS:  Current Outpatient Medications  Medication Sig Dispense Refill   levocetirizine (XYZAL) 5 MG tablet Take 5 mg by mouth every evening.     losartan (COZAAR) 50 MG tablet Take 50 mg by mouth daily.     MACROBID 100 MG capsule MACROBID 100 MG CAPS     montelukast (SINGULAIR) 10 MG tablet Take 10 mg by mouth every evening.     albuterol (VENTOLIN HFA) 108 (90 Base) MCG/ACT inhaler Inhale into the lungs. (Patient not taking: Reported on 06/08/2023)     cetirizine (ZYRTEC) 10 MG tablet Take 1 tablet (10 mg total) by mouth daily. (Patient not taking: Reported on 06/08/2023) 30 tablet 0   dicyclomine (BENTYL) 20 MG tablet Take 20 mg by mouth 4 (four) times daily as needed. (Patient not taking: Reported on 06/08/2023)     Docusate Sodium (DSS) 100 MG CAPS Take 1 capsule by mouth 2 (two) times daily. (Patient not taking: Reported on 06/08/2023)     esomeprazole (NEXIUM) 40 MG capsule Take 40 mg by mouth daily. (Patient not taking: Reported on 06/08/2023)     famotidine (PEPCID) 40 MG tablet Take 1 tablet by mouth at bedtime. (Patient not taking: Reported on  06/08/2023)     fluticasone (FLONASE) 50 MCG/ACT nasal spray Place 1 spray into both nostrils 2 (two) times daily. (Patient not taking: Reported on 06/08/2023) 16 g 0   pantoprazole (PROTONIX) 40 MG tablet Take 1 tablet (40 mg total) by mouth daily. (Patient not taking: Reported on 10/18/2021) 30 tablet 3   tiZANidine (ZANAFLEX) 4 MG tablet Take 4 mg by mouth at bedtime as needed. (Patient not taking: Reported on 06/08/2023)     No current facility-administered medications for this visit.   Facility-Administered Medications Ordered in Other Visits  Medication Dose Route Frequency Provider Last Rate Last Admin   sodium chloride flush (NS) 0.9 % injection 10 mL  10 mL Intravenous PRN Louretta Shorten R, MD   10 mL at 12/28/19 0815       PHYSICAL EXAMINATION: ECOG PERFORMANCE STATUS: 0 - Asymptomatic  Vitals:   06/08/23 1446  BP:  (!) 148/78  Pulse: 70  Resp: 18  Temp: 97.8 F (36.6 C)  SpO2: 99%   Filed Weights   06/08/23 1446  Weight: 146 lb (66.2 kg)   Patient in wheelchair because of left foot trauma.  Physical Exam HENT:     Head: Normocephalic and atraumatic.     Mouth/Throat:     Pharynx: No oropharyngeal exudate.  Eyes:     Pupils: Pupils are equal, round, and reactive to light.  Cardiovascular:     Rate and Rhythm: Normal rate and regular rhythm.  Pulmonary:     Effort: Pulmonary effort is normal. No respiratory distress.     Breath sounds: Normal breath sounds. No wheezing.  Abdominal:     General: Bowel sounds are normal. There is no distension.     Palpations: Abdomen is soft. There is no mass.     Tenderness: There is no abdominal tenderness. There is no guarding or rebound.  Musculoskeletal:        General: No tenderness. Normal range of motion.     Cervical back: Normal range of motion and neck supple.     Comments: Mild swelling of the left lower extremity; mild tenderness noted.  Skin:    General: Skin is warm.  Neurological:     Mental Status: She is alert and oriented to person, place, and time.  Psychiatric:        Mood and Affect: Affect normal.      LABORATORY DATA:  I have reviewed the data as listed Lab Results  Component Value Date   WBC 4.1 06/08/2023   HGB 12.6 06/08/2023   HCT 37.1 06/08/2023   MCV 96.6 06/08/2023   PLT 237 06/08/2023   Recent Labs    12/02/22 1417 06/08/23 1426  NA 137 135  K 3.9 3.6  CL 105 101  CO2 25 25  GLUCOSE 101* 91  BUN 16 17  CREATININE 1.05* 0.78  CALCIUM 9.0 8.9  GFRNONAA >60 >60  PROT 8.0 7.5  ALBUMIN 4.4 4.1  AST 17 21  ALT 14 18  ALKPHOS 81 65  BILITOT 0.5 0.6    RADIOGRAPHIC STUDIES: I have personally reviewed the radiological images as listed and agreed with the findings in the report. CT CHEST ABDOMEN PELVIS W CONTRAST Result Date: 06/05/2023 CLINICAL DATA:  Lymphoma staging, grade 3 follicular. * Tracking  Code: BO * EXAM: CT CHEST, ABDOMEN, AND PELVIS WITH CONTRAST TECHNIQUE: Multidetector CT imaging of the chest, abdomen and pelvis was performed following the standard protocol during bolus administration of intravenous contrast. RADIATION DOSE REDUCTION: This  exam was performed according to the departmental dose-optimization program which includes automated exposure control, adjustment of the mA and/or kV according to patient size and/or use of iterative reconstruction technique. CONTRAST:  OMNIPAQUE IOHEXOL 300 MG/ML  SOLN COMPARISON:  05/28/2022 and older. FINDINGS: CT CHEST FINDINGS Cardiovascular: Bovine type aortic arch. Normal variant. The thoracic aorta has a normal course and caliber with mild atherosclerotic plaque particularly along the descending thoracic aorta. There is some calcified plaque along the left subclavian artery as well. Heart is nonenlarged. No pericardial effusion. Mediastinum/Nodes: Slightly patulous thoracic esophagus. Slightly heterogeneous thyroid gland, unchanged. Calcification in the right thyroid lobe. No specific abnormal lymph node enlargement identified in the thorax. This includes axillary, chest wall, supraclavicular, internal mammary, hilar or mediastinal regions. Lungs/Pleura: No consolidation, pneumothorax or effusion. Stable calcified left upper lobe lung nodule on series 4, image 39 consistent with old granulomatous disease. Punctate focus in the lingula on image 85 as well. Stable linear opacity at the right lung base likely scar or atelectasis. Musculoskeletal: Slight curvature of the spine with degenerative changes. CT ABDOMEN PELVIS FINDINGS Hepatobiliary: Previous cholecystectomy. Patent portal vein. Fatty liver infiltration. No space-occupying liver lesion. Exception is a stable benign cystic lesion seen in the right hepatic lobe adjacent to the right portal vein. Pancreas: Unremarkable. No pancreatic ductal dilatation or surrounding inflammatory changes.  Spleen: Preserved enhancement of the spleen. The spleen is nonenlarged. Maximal dimension of the spleen approaches 7.4 cm. Adrenals/Urinary Tract: Adrenal glands are unremarkable. Kidneys are normal, without renal calculi, focal lesion, or hydronephrosis. Bladder is unremarkable. Stomach/Bowel: Oral contrast was provided. Large bowel has a normal course and caliber. Left-sided colonic diverticula are identified. Moderate stool. Surgical changes along the base of the cecum. Stomach and small bowel are nondilated. Vascular/Lymphatic: Normal caliber aorta and IVC. There is some stranding in the central mesentery surrounding the mesenteric vessels, unchanged from previous. This could be the sequela of previously treated lymphoma. No discrete abnormal lymph node enlargement identified in the abdomen and pelvis. Reproductive: Slightly lobular appearance to the uterus. Small fibroids., unchanged. Stable small cystic area in the left ovary measuring 9 mm. No imaging follow-up. Other: No free intra-abdominal air. No free fluid. Midline anterior abdominal wall epigastric fat containing hernia. 2 such lesions are seen. Musculoskeletal: Curvature and degenerative changes along the spine. IMPRESSION: No significant interval change. No developing abnormal lymph node enlargement seen in the chest, abdomen and pelvis. No splenomegaly. Stable soft tissue stranding and thickening in the central mesentery surrounding the central mesenteric vessels. Likely sequela of old lymph Oma. Midline anterior abdominal wall fat containing hernias. Colonic diverticula. Patulous esophagus. Electronically Signed   By: Karen Kays M.D.   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 S/p Obi-benda x6 cylces- FEB 2025 CT SCAN- No findings of adenopathy or active lymphoma. Stable stranding in  the central mesentery compatible with treated lymphoma.  No evidence of recurrence.  Labs-CBC/chemistries were reviewed with the patient.  Consider imaging on the annual basis.  Will order imaging at next visit.  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  Currently status close patient vaccination pneumonia 2023 and also COVID/flu.   IgG 2023- WNL.  recommend vit C 500 mg; and recommend vit D 1000/units.   # reflux like symptoms- on Nexium [Dr.Toledo- [facility]];  defer to GI.   # Myalgias- ? Awaiting Vit D levels.   # Right flank shingles/postherpetic neuralgia- .  Off gabapentin/cymbalta-because of poor tolerance.  Recommend follow-up with neurology especially given chronic headaches  # Asthma- [Dr.Dgyali] cough- inhalers- continue Nexium [reflux on CT scan]-stable.  # Chronic pain right quadrant abdmonal pain/chornic- EGD/Colo [NOV 2022- ] in nov 2022 [2016-colo]- stable.   # IV access: Mediport explanted; PIV  # DISPOSITION:  # refer to [facility]- neurology re: headaches/ and post- herpetic neuropathy-  # Follow up in 6 months-- ;MD; labs- cbc/cmp;LDH; quantitative immunoglobulin- Dr.B   # I reviewed the blood work- with the patient in detail; also reviewed the imaging independently [as summarized above]; and with the patient in detail.      All questions were answered. The patient knows to call the clinic with any problems, questions or concerns.    Earna Coder, MD 06/08/2023 4:19 PM

## 2023-06-09 ENCOUNTER — Telehealth: Payer: Self-pay | Admitting: *Deleted

## 2023-06-09 NOTE — Telephone Encounter (Signed)
 Referral placed in epic for [facility] neurology. Referral also faxed with medical records.

## 2023-06-30 ENCOUNTER — Ambulatory Visit: Admitting: Pulmonary Disease

## 2023-06-30 ENCOUNTER — Encounter: Payer: Self-pay | Admitting: Pulmonary Disease

## 2023-06-30 VITALS — BP 122/80 | HR 72 | Temp 97.6°F | Ht 60.0 in | Wt 144.6 lb

## 2023-06-30 DIAGNOSIS — J4520 Mild intermittent asthma, uncomplicated: Secondary | ICD-10-CM

## 2023-06-30 DIAGNOSIS — R058 Other specified cough: Secondary | ICD-10-CM

## 2023-06-30 DIAGNOSIS — J4540 Moderate persistent asthma, uncomplicated: Secondary | ICD-10-CM

## 2023-06-30 DIAGNOSIS — C8238 Follicular lymphoma grade IIIa, lymph nodes of multiple sites: Secondary | ICD-10-CM

## 2023-06-30 LAB — NITRIC OXIDE: Nitric Oxide: 11

## 2023-06-30 NOTE — Patient Instructions (Addendum)
 VISIT SUMMARY:  You came in today for a follow-up visit to transition your care from Dr. Raechel Chute. We discussed your history of moderate persistent asthma and your past treatment with an inhaler. You mentioned that you are currently feeling perfectly fine and have not used your inhaler recently. We also reviewed your history of lymphoma, for which you are under the care of Dr. Donneta Romberg. There were no current symptoms related to lymphoma discussed during this visit.  YOUR PLAN:  -MILD INTERMITTENT ASTHMA: Mild intermittent asthma is a condition where the airways in your lungs are inflamed and narrowed, causing difficulty in breathing. Your asthma appears to be well-controlled at the moment, with no active respiratory issues and a nitric oxide level of 11. You should continue to monitor your asthma symptoms and manage them as needed. Follow up on an as-needed basis.  -LYMPHOMA: Lymphoma is a type of cancer that affects the lymphatic system. You are currently under the care of your oncologist, Dr. Donneta Romberg, and there are no active lymphoma-related issues at this time. Continue your follow-up appointments with Dr. Donneta Romberg.  INSTRUCTIONS:  Please continue to monitor your asthma symptoms and manage them as needed. Follow up with your oncologist, Dr. Donneta Romberg, for your lymphoma care. If you experience any new symptoms or have concerns, schedule an appointment as needed.  RESUMEN DE LA VISITA:  Hoy acudi a una visita de seguimiento para la transicin de su atencin mdica con el Dr. Raechel Chute. Analizamos sus antecedentes de asma persistente moderada y su tratamiento previo con inhalador. Mencion que actualmente se siente perfectamente bien y que no ha usado Teacher, early years/pre. Tambin revisamos sus antecedentes de linfoma, por el cual est bajo el cuidado del Dr. Donneta Romberg. No se mencionaron sntomas actuales relacionados con el linfoma durante esta visita.  SU PLAN:  - ASMA  INTERMITENTE LEVE: El asma intermitente leve es una afeccin en la que las vas respiratorias de los pulmones se inflaman y se Engineer, technical sales, lo que causa dificultad para Industrial/product designer. Su asma parece estar bien controlada actualmente, sin problemas respiratorios activos y con un nivel de xido ntrico de 11. Debe continuar monitoreando sus sntomas de asma y controlarlos segn sea necesario. Realice un seguimiento segn sea necesario.  - LINFOMA: El linfoma es un tipo de cncer que afecta el sistema linftico. Actualmente est bajo el cuidado de su onclogo, el Dr. Donneta Romberg, y no presenta problemas activos relacionados con el linfoma. Contine con sus citas de seguimiento con el Dr. Donneta Romberg.  INSTRUCCIONES:  Contine monitoreando sus sntomas de asma y manjelos segn sea necesario. Realice un seguimiento con su onclogo, el Dr. Donneta Romberg, para el cuidado de su linfoma. Si experimenta algn sntoma nuevo o tiene alguna inquietud, programe una cita segn sea necesario.

## 2023-06-30 NOTE — Progress Notes (Unsigned)
 Subjective:    Patient ID: Kim Contreras, female    DOB: 01-28-1961, 63 y.o.   MRN: [id number]  Patient Care Team: Center, [facility] as PCP - General (General Practice) Earna Coder, MD as Consulting Physician (Hematology and Oncology) Lemar Livings Merrily Pew, MD as Consulting Physician (General Surgery)  Chief Complaint  Patient presents with  . Follow-up    No shortness of breath. No cough or wheezing.     BACKGROUND:   HPI    Review of Systems A 10 point review of systems was performed and it is as noted above otherwise negative.   Past Medical History:  Diagnosis Date  . Anemia 2006  . Anxiety   . Arthritis   . Diverticulosis   . Helicobacter pylori gastritis   . History of hiatal hernia   . Hypertension   . Lymphoma [facility])     Past Surgical History:  Procedure Laterality Date  . APPENDECTOMY    . CHOLECYSTECTOMY N/A 01/19/2015   Procedure: LAPAROSCOPIC CHOLECYSTECTOMY;  Surgeon: Nadeen Landau, MD;  [name];  Service: General;  [name];  . COLONOSCOPY WITH PROPOFOL N/A 10/27/2014   Procedure: COLONOSCOPY WITH PROPOFOL;  Surgeon: Christena Deem, MD;  Location: [facility] ENDOSCOPY;  Service: Endoscopy;  [name];  . COLONOSCOPY WITH PROPOFOL N/A 02/05/2021   Procedure: COLONOSCOPY WITH PROPOFOL;  Surgeon: Regis Bill, MD;  [name];  Service: Endoscopy;  [name];  . ESOPHAGOGASTRODUODENOSCOPY    . ESOPHAGOGASTRODUODENOSCOPY (EGD) WITH PROPOFOL N/A 02/05/2021   Procedure: ESOPHAGOGASTRODUODENOSCOPY (EGD) WITH PROPOFOL;  Surgeon: Regis Bill, MD;  [name];  Service: Endoscopy;  [name];  . EXCISION MASS ABDOMINAL N/A 06/03/2019   Procedure: EXCISION MASS ABDOMINAL;  Surgeon: Earline Mayotte, MD;  [name];  Service: General;  [name];  abdominal node biopsy  . NASAL SINUS SURGERY    . PORT-A-CATH REMOVAL  09/2020   by Dr.  Lemar Livings  . PORTACATH PLACEMENT Left 07/08/2019   Procedure: INSERTION PORT-A-CATH;  Surgeon: Earline Mayotte, MD;  [name];  Service: General;  Laterality: Left;  . TOE AMPUTATION Left 11/28/2021   middle toe  . TUBAL LIGATION      Patient Active Problem List   Diagnosis Date Noted  . COVID-19 04/17/2020  . Bacterial pneumonia 04/17/2020  . HTN (hypertension) 04/17/2020  . Drug-induced neutropenia (HCC) 11/21/2019  . Swelling of left lower extremity 11/02/2019  . Goals of care, counseling/discussion 06/13/2019  . Lymphoma of lymph nodes in abdomen (HCC) 06/10/2019  . 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 Lymphoma (HCC) 06/03/2019  . Irregularly shaped mass of abdomen 05/09/2019    Family History  Problem Relation Age of Onset  . Breast cancer Other 43  . Prostate cancer Neg Hx   . Bladder Cancer Neg Hx   . Kidney cancer Neg Hx     Social History   Tobacco Use  . Smoking status: Never  . Smokeless tobacco: Never  Substance Use Topics  . Alcohol use: No    Allergies  Allergen Reactions  . Chlorhexidine     Not required  . Pseudoephedrine Hcl Other (See Comments)    Current Meds  Medication Sig  . cetirizine (ZYRTEC) 10 MG tablet Take 1 tablet (10 mg total) by mouth daily.  Marland Kitchen esomeprazole (NEXIUM) 40 MG capsule Take 40 mg by mouth daily.  . fluticasone (FLONASE) 50 MCG/ACT nasal  spray Place 1 spray into both nostrils 2 (two) times daily.  Marland Kitchen levocetirizine (XYZAL) 5 MG tablet Take 5 mg by mouth every evening.  Marland Kitchen losartan (COZAAR) 50 MG tablet Take 50 mg by mouth daily.  . montelukast (SINGULAIR) 10 MG tablet Take 10 mg by mouth every evening.    Immunization History  Administered Date(s) Administered  . Influenza-Unspecified 11/29/2021  . Research officer, trade union 4yrs & up 05/07/2021  . Tdap 08/12/2021        Objective:     BP 122/80 (BP Location: Left Arm, Patient Position:  Sitting, Cuff Size: Normal)   Pulse 72   Temp 97.6 F (36.4 C) (Temporal)   Ht 5' (1.524 m)   Wt 144 lb 9.6 oz (65.6 kg)   LMP 01/18/2005   SpO2 96%   BMI 28.24 kg/m   SpO2: 96 %  GENERAL: Well-developed, well-nourished woman, no acute distress, fully ambulatory, no conversational dyspnea. HEAD: Normocephalic, atraumatic.  EYES: Pupils equal, round, reactive to light.  No scleral icterus.  MOUTH: Dentition intact, oral mucosa moist.  No thrush. NECK: Supple. No thyromegaly. Trachea midline. No JVD.  No adenopathy. PULMONARY: Good air entry bilaterally.  No adventitious sounds. CARDIOVASCULAR: S1 and S2. Regular rate and rhythm.  ABDOMEN: Benign. MUSCULOSKELETAL: No joint deformity, no clubbing, no edema.  NEUROLOGIC: No overt focal deficit, no gait disturbance, speech is fluent. SKIN: Intact,warm,dry. PSYCH: Mood and behavior normal.        Assessment & Plan:   No diagnosis found.  No orders of the defined types were placed in this encounter.   No orders of the defined types were placed in this encounter.    Advised if symptoms do not improve or worsen, to please contact office for sooner follow up or seek emergency care.    I spent xxx minutes of dedicated to the care of this patient on the date of this encounter to include pre-visit review of records, face-to-face time with the patient discussing conditions above, post visit ordering of testing, clinical documentation with the electronic health record, making appropriate referrals as documented, and communicating necessary findings to members of the patients care team.   C. Danice Goltz, MD Advanced Bronchoscopy PCCM [facility]    *This note was dictated using voice recognition software/Dragon.  Despite best efforts to proofread, errors can occur which can change the meaning. Any transcriptional errors that result from this process are unintentional and may not be fully corrected at the time  of dictation.

## 2023-07-01 ENCOUNTER — Encounter: Payer: Self-pay | Admitting: Pulmonary Disease

## 2023-07-31 ENCOUNTER — Other Ambulatory Visit: Payer: Self-pay | Admitting: Primary Care

## 2023-07-31 DIAGNOSIS — Z1231 Encounter for screening mammogram for malignant neoplasm of breast: Secondary | ICD-10-CM

## 2023-08-11 ENCOUNTER — Ambulatory Visit
Admission: RE | Admit: 2023-08-11 | Discharge: 2023-08-11 | Disposition: A | Discharge status: Home / Self Care | Source: Ambulatory Visit | Attending: Primary Care | Admitting: Primary Care

## 2023-08-11 DIAGNOSIS — Z1231 Encounter for screening mammogram for malignant neoplasm of breast: Secondary | ICD-10-CM | POA: Insufficient documentation

## 2023-12-09 ENCOUNTER — Inpatient Hospital Stay: Attending: Internal Medicine

## 2023-12-09 ENCOUNTER — Encounter: Payer: Self-pay | Admitting: Internal Medicine

## 2023-12-09 ENCOUNTER — Inpatient Hospital Stay: Admitting: Internal Medicine

## 2023-12-09 VITALS — BP 128/88 | HR 63 | Temp 98.0°F | Resp 18 | Ht 60.0 in | Wt 143.4 lb

## 2023-12-09 DIAGNOSIS — R109 Unspecified abdominal pain: Secondary | ICD-10-CM | POA: Diagnosis not present

## 2023-12-09 DIAGNOSIS — R519 Headache, unspecified: Secondary | ICD-10-CM | POA: Diagnosis not present

## 2023-12-09 DIAGNOSIS — Z08 Encounter for follow-up examination after completed treatment for malignant neoplasm: Secondary | ICD-10-CM

## 2023-12-09 DIAGNOSIS — Z8572 Personal history of non-Hodgkin lymphomas: Secondary | ICD-10-CM | POA: Diagnosis not present

## 2023-12-09 DIAGNOSIS — Z9221 Personal history of antineoplastic chemotherapy: Secondary | ICD-10-CM | POA: Insufficient documentation

## 2023-12-09 DIAGNOSIS — Z803 Family history of malignant neoplasm of breast: Secondary | ICD-10-CM

## 2023-12-09 DIAGNOSIS — C8238 Follicular lymphoma grade IIIa, lymph nodes of multiple sites: Secondary | ICD-10-CM

## 2023-12-09 DIAGNOSIS — D849 Immunodeficiency, unspecified: Secondary | ICD-10-CM | POA: Insufficient documentation

## 2023-12-09 DIAGNOSIS — J45909 Unspecified asthma, uncomplicated: Secondary | ICD-10-CM | POA: Diagnosis not present

## 2023-12-09 DIAGNOSIS — G8929 Other chronic pain: Secondary | ICD-10-CM | POA: Insufficient documentation

## 2023-12-09 LAB — CBC WITH DIFFERENTIAL (CANCER CENTER ONLY)
Abs Immature Granulocytes: 0.01 K/uL (ref 0.00–0.07)
Basophils Absolute: 0 K/uL (ref 0.0–0.1)
Basophils Relative: 1 %
Eosinophils Absolute: 0 K/uL (ref 0.0–0.5)
Eosinophils Relative: 1 %
HCT: 37.9 % (ref 36.0–46.0)
Hemoglobin: 13.2 g/dL (ref 12.0–15.0)
Immature Granulocytes: 0 %
Lymphocytes Relative: 42 %
Lymphs Abs: 1.7 K/uL (ref 0.7–4.0)
MCH: 33 pg (ref 26.0–34.0)
MCHC: 34.8 g/dL (ref 30.0–36.0)
MCV: 94.8 fL (ref 80.0–100.0)
Monocytes Absolute: 0.3 K/uL (ref 0.1–1.0)
Monocytes Relative: 8 %
Neutro Abs: 1.9 K/uL (ref 1.7–7.7)
Neutrophils Relative %: 48 %
Platelet Count: 207 K/uL (ref 150–400)
RBC: 4 MIL/uL (ref 3.87–5.11)
RDW: 12.9 % (ref 11.5–15.5)
WBC Count: 4 K/uL (ref 4.0–10.5)
nRBC: 0 % (ref 0.0–0.2)

## 2023-12-09 LAB — CMP (CANCER CENTER ONLY)
ALT: 12 U/L (ref 0–44)
AST: 15 U/L (ref 15–41)
Albumin: 4.2 g/dL (ref 3.5–5.0)
Alkaline Phosphatase: 73 U/L (ref 38–126)
Anion gap: 9 (ref 5–15)
BUN: 19 mg/dL (ref 8–23)
CO2: 23 mmol/L (ref 22–32)
Calcium: 9 mg/dL (ref 8.9–10.3)
Chloride: 100 mmol/L (ref 98–111)
Creatinine: 0.74 mg/dL (ref 0.44–1.00)
GFR, Estimated: >60 mL/min (ref >60)
Glucose, Bld: 96 mg/dL (ref 70–99)
Potassium: 3.9 mmol/L (ref 3.5–5.1)
Sodium: 132 mmol/L — ABNORMAL LOW (ref 135–145)
Total Bilirubin: 0.6 mg/dL (ref 0.0–1.2)
Total Protein: 7.4 g/dL (ref 6.5–8.1)

## 2023-12-09 LAB — LACTATE DEHYDROGENASE: LDH: 128 U/L (ref 98–192)

## 2023-12-09 NOTE — Progress Notes (Signed)
 C/o having headaches everyday for months now. C/o neck pain, 8/10, no injury. She is taking nothing for the pain.

## 2023-12-09 NOTE — Assessment & Plan Note (Addendum)
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 S/p Obi-benda x6 cylces- FEB 2025 CT SCAN- No findings of adenopathy or active lymphoma. Stable stranding in the central mesentery compatible with treated lymphoma.  No evidence of recurrence. Stable.   Labs-CBC/chemistries were reviewed with the patient.  Consider imaging on the annual basis.  Will order imaging today.    # Immunodeficiency: hx of recurrent infections.none recently.  Also immunoglobin levels adequate.  # History of chronic headaches-worse recently.  Associated with vision changes.  Worse with neck movement side-to-side.-Recommend follow-up with neurology.  # Asthma- [Dr.Dgyali] cough- inhalers- continue Nexium [reflux on CT scan]-stable.  # Chronic pain right quadrant abdmonal pain/chornic- EGD/Colo [NOV 2022- ] in nov 2022 [2016-colo]- stable.   # IV access: Mediport explanted; PIV  # DISPOSITION:  # Follow up in 6 months-- ;MD; labs- cbc/cmp;LDH; quantitative immunoglobulin- CT CAP Dr.B

## 2023-12-09 NOTE — Addendum Note (Signed)
 Addended by: JOSHUA ALFONSO CROME on: 12/09/2023 04:21 PM   Modules accepted: Orders

## 2023-12-09 NOTE — Progress Notes (Signed)
 [facility] CONSULT NOTE  Patient Care Team: [facility] as PCP - General (General Practice) Rennie Kim SAUNDERS, MD as Consulting Physician (Hematology and Oncology) Dessa Reyes ORN, MD as Consulting Physician (General Surgery)  CHIEF COMPLAINTS/PURPOSE OF CONSULTATION: Lymphoma   Oncology History Overview Note  # 5th FEB 2021- ABDOMINAL MASS-  9.3 x 3.0 cm central mesenteric soft tissue mass is identified on image 38/series 2. 3.0x 3.0 cm collar of soft tissue surrounds branches of the superior mesenteric artery and vein on image 47/2 with relatively little mass-effect on the vascular anatomy. 2.0 x 1.5 cm nodular component of this abnormal soft tissue is identified in the mesentery of the right pelvis on 55/2. FEB 2021- PET-  PET scan-February 2021-SUV around 5.5 again highly suggestive of malignancy lymphoma.  Small cluster left supra pelvic lymph node/small retroperitoneal lymph nodes-5 mm-Douville score 3.   # MARCH 2021- grade 3A follicular lymphoma [Open biopsy Dr. Burnett-] STAGE II vs III (equivocal neck lymph node) [no bone marrow bx - #Status post-Gazyva -Bendamustine  x6 cycles [finished August 2021]- PET scan February, 2022-treated disease noted to have any progression.  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  Kim Contreras 63 y.o.  female with follicular lymphoma grade 3 status post Gazyva -Gemma Calandra chemotherapy in February 2022. ]-with intermittent neutropenia currently on surveillance is here for follow-up.  C/o having  headaches everyday for months now. Also complains of blurry vision. C/o neck pain, 8/10, no injury. She is taking nothing for the pain.   Patient complains of ongoing pain in the right lower back and also right abdomen.  Constipation relieved by Colace and prune.   Review of Systems  Constitutional:  Positive for malaise/fatigue. Negative for chills, diaphoresis, [name], hemoptysis, sputum production, [name], palpitations, [name], diarrhea, heartburn, melena and vomiting.  Musculoskeletal:  Positive for back pain and joint pain.  Skin: Negative.  Negative for itching and rash.  Neurological:  Positive for tingling. Negative for dizziness, focal weakness and weakness.  Endo/Heme/Allergies:  Does not bruise/bleed easily.  Psychiatric/Behavioral:  Negative for depression. The patient has insomnia. The patient is not nervous/anxious.      MEDICAL HISTORY:  Past Medical History:  Diagnosis Date   Anemia 2006   Anxiety    Arthritis    Diverticulosis    Helicobacter pylori gastritis    History of hiatal hernia    Hypertension    Lymphoma (HCC)     SURGICAL HISTORY: Past Surgical History:  Procedure Laterality Date   APPENDECTOMY     CHOLECYSTECTOMY N/A 01/19/2015   Procedure: LAPAROSCOPIC CHOLECYSTECTOMY;  Surgeon: Larinda Unknown Sharps, MD;  [name];  Service: General;  [name];   COLONOSCOPY WITH PROPOFOL  N/A 10/27/2014   Procedure: COLONOSCOPY WITH PROPOFOL ;  Surgeon: Gladis RAYMOND Mariner, MD;  Location:  [facility];  Service: Endoscopy;  [name];   COLONOSCOPY WITH PROPOFOL  N/A 02/05/2021   Procedure: COLONOSCOPY WITH PROPOFOL ;  Surgeon: Maryruth Ole DASEN,  MD;  [name];  Service: Endoscopy;  [name];   ESOPHAGOGASTRODUODENOSCOPY     ESOPHAGOGASTRODUODENOSCOPY (EGD) WITH PROPOFOL  N/A 02/05/2021   Procedure: ESOPHAGOGASTRODUODENOSCOPY (EGD) WITH PROPOFOL ;  Surgeon: Maryruth Ole DASEN, MD;  [name];  Service: Endoscopy;  [name];   EXCISION MASS ABDOMINAL N/A 06/03/2019   Procedure: EXCISION MASS ABDOMINAL;  Surgeon: Dessa Reyes ORN, MD;  [name];  Service: General;  [name];  abdominal node biopsy   NASAL SINUS SURGERY     PORT-A-CATH REMOVAL  09/2020   by Dr. Dessa PINON PLACEMENT Left 07/08/2019   Procedure: INSERTION PORT-A-CATH;  Surgeon: Dessa Reyes ORN, MD;  [name];  Service: General;  Laterality: Left;   TOE AMPUTATION Left 11/28/2021   middle toe   TUBAL LIGATION      SOCIAL HISTORY: Social History   Socioeconomic History   Marital status: Married    Spouse name: Not on file   Number of children: Not on file   Years of education: Not on file   Highest education level: Not on file  Occupational History   Not on file  Tobacco Use   Smoking status: Never   Smokeless tobacco: Never  Vaping Use   Vaping status: Never Used  Substance and Sexual Activity   Alcohol use: No   Drug use: No   Sexual activity: Not on file  Other Topics Concern   Not on file  Social History Narrative   Not on file   Social Drivers of Health   Financial Resource Strain: Not on file  Food Insecurity: Not on file  Transportation Needs: Not on file  Physical Activity: Not on file  Stress: Not on file  Social Connections: Not on file  Intimate Partner Violence: Not on file    FAMILY HISTORY: Family History  Problem Relation Age of Onset   Breast cancer Other 58   Prostate cancer Neg Hx    Bladder Cancer Neg Hx    Kidney cancer Neg Hx     ALLERGIES:  is allergic to chlorhexidine  and pseudoephedrine hcl.  MEDICATIONS:  Current Outpatient  Medications  Medication Sig Dispense Refill   cetirizine  (ZYRTEC ) 10 MG tablet Take 1 tablet (10 mg total) by mouth daily. 30 tablet 0   esomeprazole (NEXIUM) 40 MG capsule Take 40 mg by mouth daily.     fluticasone  (FLONASE ) 50 MCG/ACT nasal spray Place 1 spray into both nostrils 2 (two) times daily. 16 g 0   losartan  (COZAAR ) 100 MG tablet Take 100 mg by mouth daily.     montelukast  (SINGULAIR ) 10 MG tablet Take 10 mg by mouth every evening.     No current facility-administered medications for this visit.   Facility-Administered Medications Ordered in Other Visits  Medication Dose Route Frequency Provider Last Rate Last Admin   sodium chloride  flush (NS) 0.9 % injection 10 mL  10 mL Intravenous PRN Kateland Leisinger R, MD   10 mL at 12/28/19 0815       PHYSICAL EXAMINATION: ECOG PERFORMANCE STATUS: 0 - Asymptomatic  Vitals:   12/09/23 1523  BP: 128/88  Pulse: 63  Resp: 18  Temp: 98 F (36.7 C)  SpO2: 99%   Filed Weights   12/09/23 1523  Weight: 143 lb 6.4 oz (65 kg)   Patient in wheelchair because  of left foot trauma.  Physical Exam HENT:     Head: Normocephalic and atraumatic.     Mouth/Throat:     Pharynx: No oropharyngeal exudate.  Eyes:     Pupils: Pupils are equal, round, and reactive to light.  Cardiovascular:     Rate and Rhythm: Normal rate and regular rhythm.  Pulmonary:     Effort: Pulmonary effort is normal. No respiratory distress.     Breath sounds: Normal breath sounds. No wheezing.  Abdominal:     General: Bowel sounds are normal. There is no distension.     Palpations: Abdomen is soft. There is no mass.     Tenderness: There is no abdominal tenderness. There is no guarding or rebound.  Musculoskeletal:        General: No tenderness. Normal range of motion.     Cervical back: Normal range of motion and neck supple.     Comments: Mild swelling of the left lower extremity; mild tenderness noted.  Skin:    General: Skin is warm.  Neurological:      Mental Status: She is alert and oriented to person, place, and time.  Psychiatric:        Mood and Affect: Affect normal.      LABORATORY DATA:  I have reviewed the data as listed Lab Results  Component Value Date   WBC 4.0 12/09/2023   HGB 13.2 12/09/2023   HCT 37.9 12/09/2023   MCV 94.8 12/09/2023   PLT 207 12/09/2023   Recent Labs    06/08/23 1426 12/09/23 1507  NA 135 132*  K 3.6 3.9  CL 101 100  CO2 25 23  GLUCOSE 91 96  BUN 17 19  CREATININE 0.78 0.74  CALCIUM 8.9 9.0  GFRNONAA >60 >60  PROT 7.5 7.4  ALBUMIN 4.1 4.2  AST 21 15  ALT 18 12  ALKPHOS 65 73  BILITOT 0.6 0.6    RADIOGRAPHIC STUDIES: I have personally reviewed the radiological images as listed and agreed with the findings in the report. No results found.    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 S/p Obi-benda x6 cylces- FEB 2025 CT SCAN- No findings of adenopathy or active lymphoma. Stable stranding in the central mesentery compatible with treated lymphoma.  No evidence of recurrence. Stable.   Labs-CBC/chemistries were reviewed with the patient.  Consider imaging on the annual basis.  Will order imaging today.    # Immunodeficiency: hx of recurrent infections.none recently.  Also immunoglobin levels adequate.  # History of chronic headaches-worse recently.  Associated with vision changes.  Worse with neck movement side-to-side.-Recommend follow-up with neurology.  # Asthma- [Dr.Dgyali] cough- inhalers- continue Nexium [reflux on CT scan]-stable.  # Chronic pain right quadrant abdmonal pain/chornic- EGD/Colo [NOV 2022- ] in nov 2022 [2016-colo]- stable.   # IV access: Mediport explanted; PIV  # DISPOSITION:  # Follow up in 6 months-- ;MD; labs- cbc/cmp;LDH; quantitative immunoglobulin- CT CAP Dr.B      All questions were answered. The patient  knows to call the clinic with any problems, questions or concerns.    Kim JONELLE Joe, MD 12/09/2023 4:11 PM

## 2023-12-11 LAB — IMMUNOGLOBULINS A/E/G/M, SERUM
IgA: 207 mg/dL (ref 87–352)
IgE (Immunoglobulin E), Serum: 143 [IU]/mL (ref 6–495)
IgG (Immunoglobin G), Serum: 1437 mg/dL (ref 586–1602)
IgM (Immunoglobulin M), Srm: 103 mg/dL (ref 26–217)

## 2024-01-06 ENCOUNTER — Other Ambulatory Visit: Payer: Self-pay

## 2024-01-06 DIAGNOSIS — R319 Hematuria, unspecified: Secondary | ICD-10-CM

## 2024-01-12 ENCOUNTER — Ambulatory Visit: Admitting: Urology

## 2024-01-12 VITALS — BP 153/90 | HR 80 | Wt 148.0 lb

## 2024-01-12 DIAGNOSIS — N3281 Overactive bladder: Secondary | ICD-10-CM | POA: Diagnosis not present

## 2024-01-12 DIAGNOSIS — R350 Frequency of micturition: Secondary | ICD-10-CM

## 2024-01-12 LAB — BLADDER SCAN AMB NON-IMAGING

## 2024-01-12 NOTE — Patient Instructions (Signed)

## 2024-01-12 NOTE — Progress Notes (Signed)
   01/12/2024 3:11 PM   Kim Contreras 11/03/1960 [id number]  Reason for visit: OAB  History: Previously followed by Dr. Penne and Kim Contreras for OAB and was on Myrbetriq  Negative cystoscopy in 2021  Physical Exam: BP (!) 153/90 (BP Location: Left Arm, Patient Position: Sitting, Cuff Size: Normal)   Pulse 80   Wt 148 lb (67.1 kg)   LMP 01/18/2005   SpO2 98%   BMI 28.90 kg/m   Imaging/labs: I personally viewed and interpreted the CT chest abdomen pelvis with contrast from March 2025 with no urologic abnormalities Labs reviewed from PCP, dipstick positive blood, but no microscopic hematuria  Today: She is off Myrbetriq , reports at least 6 months of urgency, frequency.  No problems with nocturia.  No UTI or gross hematuria. Drinks primarily water, some coffee during the day  Plan:   OAB: We discussed that overactive bladder (OAB) is not a disease, but is a symptom complex that is generally not life-threatening.  Symptoms typically include urinary urgency, frequency, and urge incontinence.  There are numerous treatment options, however there are risks and benefits with both medical and surgical management.  First-line treatment is behavioral therapies including bladder training, pelvic floor muscle training, and fluid management.  Second line treatments include oral antimuscarinics(Ditropan er, Trospium) and beta-3 agonist (Mybetriq). There is typically a period of medication trial (4-8 weeks) to find the optimal therapy and dosing. If symptoms are bothersome despite the above management, third line options include intra-detrusor botox, peripheral tibial nerve stimulation (PTNS), and interstim (SNS). These are more invasive treatments with higher side effect profile, but may improve quality of life for patients with severe OAB symptoms.  She prefers PTNS, would like to avoid medications.  If PTNS not approved can go back on Myrbetriq    Kim JAYSON Burnet, MD  [facility]  Urology [address] [place], [place] [address] [phone]

## 2024-01-26 ENCOUNTER — Telehealth: Payer: Self-pay

## 2024-01-26 NOTE — Telephone Encounter (Signed)
 Called patient to get patient scheduled for PTNS no answer left message to call back

## 2024-03-21 ENCOUNTER — Other Ambulatory Visit: Payer: Self-pay | Admitting: Primary Care

## 2024-03-21 DIAGNOSIS — Z1231 Encounter for screening mammogram for malignant neoplasm of breast: Secondary | ICD-10-CM

## 2024-06-07 ENCOUNTER — Ambulatory Visit

## 2024-06-21 ENCOUNTER — Ambulatory Visit: Admitting: Internal Medicine

## 2024-06-21 ENCOUNTER — Other Ambulatory Visit
